# Patient Record
Sex: Female | Born: 1935 | Race: White | Hispanic: No | Smoking: Never smoker
Health system: Southern US, Community
[De-identification: ages and names within clinical notes are randomized; demographics above are authoritative.]

## PROBLEM LIST (undated history)

## (undated) DIAGNOSIS — E079 Disorder of thyroid, unspecified: Secondary | ICD-10-CM

## (undated) DIAGNOSIS — M810 Age-related osteoporosis without current pathological fracture: Secondary | ICD-10-CM

## (undated) DIAGNOSIS — Z86718 Personal history of other venous thrombosis and embolism: Secondary | ICD-10-CM

## (undated) DIAGNOSIS — F0390 Unspecified dementia without behavioral disturbance: Secondary | ICD-10-CM

## (undated) DIAGNOSIS — H409 Unspecified glaucoma: Secondary | ICD-10-CM

## (undated) DIAGNOSIS — I10 Essential (primary) hypertension: Secondary | ICD-10-CM

## (undated) HISTORY — DX: Personal history of other venous thrombosis and embolism: Z86.718

## (undated) HISTORY — PX: NO PAST SURGERIES: SHX2092

## (undated) HISTORY — DX: Unspecified glaucoma: H40.9

## (undated) HISTORY — DX: Essential (primary) hypertension: I10

## (undated) HISTORY — DX: Unspecified dementia, unspecified severity, without behavioral disturbance, psychotic disturbance, mood disturbance, and anxiety: F03.90

## (undated) HISTORY — DX: Age-related osteoporosis without current pathological fracture: M81.0

## (undated) HISTORY — DX: Disorder of thyroid, unspecified: E07.9

## (undated) NOTE — *Deleted (*Deleted)
Vascular Surgery  Patient with recent

---

## 2004-04-09 ENCOUNTER — Ambulatory Visit: Payer: Self-pay | Admitting: Family Medicine

## 2005-11-05 ENCOUNTER — Ambulatory Visit: Payer: Self-pay | Admitting: Family Medicine

## 2006-12-23 ENCOUNTER — Ambulatory Visit: Payer: Self-pay | Admitting: Family Medicine

## 2008-02-14 ENCOUNTER — Ambulatory Visit: Payer: Self-pay | Admitting: Anesthesiology

## 2009-02-14 ENCOUNTER — Ambulatory Visit: Payer: Self-pay | Admitting: Anesthesiology

## 2010-07-16 ENCOUNTER — Ambulatory Visit: Payer: Self-pay | Admitting: Anesthesiology

## 2011-08-06 ENCOUNTER — Ambulatory Visit: Payer: Self-pay | Admitting: Family Medicine

## 2011-12-23 ENCOUNTER — Emergency Department: Payer: Self-pay | Admitting: Emergency Medicine

## 2011-12-23 LAB — URINALYSIS, COMPLETE
Blood: NEGATIVE
Ketone: NEGATIVE
Nitrite: NEGATIVE
Ph: 5 (ref 4.5–8.0)
RBC,UR: <1 /HPF (ref 0–5)
Specific Gravity: 1.013 (ref 1.003–1.030)
Squamous Epithelial: <1

## 2011-12-23 LAB — COMPREHENSIVE METABOLIC PANEL
Alkaline Phosphatase: 57 U/L (ref 50–136)
Anion Gap: 6 — ABNORMAL LOW (ref 7–16)
BUN: 10 mg/dL (ref 7–18)
Calcium, Total: 9.5 mg/dL (ref 8.5–10.1)
Co2: 30 mmol/L (ref 21–32)
EGFR (Non-African Amer.): >60
Glucose: 129 mg/dL — ABNORMAL HIGH (ref 65–99)
Osmolality: 286 (ref 275–301)
Potassium: 4.2 mmol/L (ref 3.5–5.1)
SGOT(AST): 21 U/L (ref 15–37)
SGPT (ALT): 17 U/L (ref 12–78)
Sodium: 143 mmol/L (ref 136–145)
Total Protein: 7.4 g/dL (ref 6.4–8.2)

## 2011-12-23 LAB — CBC
HGB: 14.1 g/dL (ref 12.0–16.0)
MCH: 31.1 pg (ref 26.0–34.0)
MCV: 93 fL (ref 80–100)
RBC: 4.53 10*6/uL (ref 3.80–5.20)

## 2011-12-23 LAB — TROPONIN I: Troponin-I: <0.02 ng/mL

## 2012-01-20 ENCOUNTER — Ambulatory Visit: Payer: Self-pay | Admitting: Neurology

## 2013-06-14 DIAGNOSIS — I351 Nonrheumatic aortic (valve) insufficiency: Secondary | ICD-10-CM | POA: Insufficient documentation

## 2014-05-29 DIAGNOSIS — H4011X2 Primary open-angle glaucoma, moderate stage: Secondary | ICD-10-CM | POA: Diagnosis not present

## 2014-07-04 DIAGNOSIS — M810 Age-related osteoporosis without current pathological fracture: Secondary | ICD-10-CM | POA: Diagnosis not present

## 2014-07-10 ENCOUNTER — Ambulatory Visit
Admit: 2014-07-10 | Disposition: A | Discharge status: Home / Self Care | Payer: Self-pay | Attending: Family Medicine | Admitting: Family Medicine

## 2014-07-10 DIAGNOSIS — M8580 Other specified disorders of bone density and structure, unspecified site: Secondary | ICD-10-CM | POA: Diagnosis not present

## 2014-07-10 DIAGNOSIS — M8589 Other specified disorders of bone density and structure, multiple sites: Secondary | ICD-10-CM | POA: Diagnosis not present

## 2014-08-30 ENCOUNTER — Telehealth: Payer: Self-pay | Admitting: Family Medicine

## 2014-08-30 DIAGNOSIS — M8580 Other specified disorders of bone density and structure, unspecified site: Secondary | ICD-10-CM

## 2014-08-30 MED ORDER — ALENDRONATE SODIUM 70 MG PO TABS: 70.0000 mg | ORAL_TABLET | ORAL | Status: DC

## 2014-08-30 NOTE — Telephone Encounter (Signed)
Was a patient of Dr Cecile Hearing but states she has seen you before. Patient is completely out of Alendronate 70mg  she took the last one on Monday. Requesting that you send it to [facility]. Please call [phone] and it is okay to leave a detailed message. Pt was informed that they may have to reschedule an appointment but they wanted me to send a message first.

## 2015-01-03 ENCOUNTER — Ambulatory Visit: Payer: Self-pay | Admitting: Family Medicine

## 2015-05-07 ENCOUNTER — Ambulatory Visit (INDEPENDENT_AMBULATORY_CARE_PROVIDER_SITE_OTHER): Payer: Medicare Other | Admitting: Family Medicine

## 2015-05-07 ENCOUNTER — Encounter: Payer: Self-pay | Admitting: Family Medicine

## 2015-05-07 VITALS — BP 110/70 | HR 71 | Temp 97.4°F | Resp 16 | Ht 66.0 in | Wt 168.0 lb

## 2015-05-07 DIAGNOSIS — E785 Hyperlipidemia, unspecified: Secondary | ICD-10-CM | POA: Insufficient documentation

## 2015-05-07 DIAGNOSIS — E039 Hypothyroidism, unspecified: Secondary | ICD-10-CM | POA: Diagnosis not present

## 2015-05-07 DIAGNOSIS — M810 Age-related osteoporosis without current pathological fracture: Secondary | ICD-10-CM | POA: Insufficient documentation

## 2015-05-07 DIAGNOSIS — R739 Hyperglycemia, unspecified: Secondary | ICD-10-CM | POA: Diagnosis not present

## 2015-05-07 DIAGNOSIS — M8580 Other specified disorders of bone density and structure, unspecified site: Secondary | ICD-10-CM | POA: Diagnosis not present

## 2015-05-07 DIAGNOSIS — E079 Disorder of thyroid, unspecified: Secondary | ICD-10-CM | POA: Insufficient documentation

## 2015-05-07 MED ORDER — LEVOTHYROXINE SODIUM 50 MCG PO TABS: 50.0000 ug | ORAL_TABLET | Freq: Every day | ORAL | Status: DC

## 2015-05-07 MED ORDER — ALENDRONATE SODIUM 70 MG PO TABS: 70.0000 mg | ORAL_TABLET | ORAL | Status: DC

## 2015-05-07 NOTE — Progress Notes (Signed)
Name: [name]   MRN: [id number]    DOB: 1928-01-25   Date:05/07/2015       Progress Note  Subjective  Chief Complaint  Chief Complaint  Patient presents with  . Hypothyroidism  . Osteoporosis    Thyroid Problem Presents for follow-up visit. Patient reports no cold intolerance, constipation, depressed mood, dry skin, fatigue or hair loss. Past treatments include levothyroxine.   Osteoporosis  Pt. Has a long-history of Osteoporosis per Dr. Gaylene Brooks records, has been on Alendronate 70 mg once weekly, no side effects reported. Last DEXA Scan in April 2016, showed Osteopenia. Past Medical History  Diagnosis Date  . Thyroid disease   . Osteoporosis     Past Surgical History  Procedure Laterality Date  . No past surgeries      History reviewed. No pertinent family history.  Social History   Social History  . Marital Status: Widowed    Spouse Name: N/A  . Number of Children: N/A  . Years of Education: N/A   Occupational History  . Not on file.   Social History Main Topics  . Smoking status: Never Smoker   . Smokeless tobacco: Not on file  . Alcohol Use: No  . Drug Use: No  . Sexual Activity: Not on file   Other Topics Concern  . Not on file   Social History Narrative  . No narrative on file     Current outpatient prescriptions:  .  metoprolol succinate (TOPROL-XL) 25 MG 24 hr tablet, , Disp: , Rfl:  .  alendronate (FOSAMAX) 70 MG tablet, Take 1 tablet (70 mg total) by mouth every 7 (seven) days. Take with a full glass of water on an empty stomach., Disp: 12 tablet, Rfl: 3 .  aspirin EC 81 MG tablet, Take by mouth., Disp: , Rfl:  .  bimatoprost (LUMIGAN) 0.01 % SOLN, , Disp: , Rfl:  .  brinzolamide (AZOPT) 1 % ophthalmic suspension, , Disp: , Rfl:  .  levothyroxine (SYNTHROID, LEVOTHROID) 50 MCG tablet, Take by mouth., Disp: , Rfl:   No Known Allergies   Review of Systems  Constitutional: Negative for fever, chills, malaise/fatigue and fatigue.   Gastrointestinal: Negative for heartburn, nausea, vomiting and constipation.  Musculoskeletal: Negative for back pain and joint pain.  Endo/Heme/Allergies: Negative for cold intolerance.     Objective  Filed Vitals:   05/07/15 1110  BP: 110/70  Pulse: 71  Temp: 97.4 F (36.3 C)  TempSrc: Oral  Resp: 16  Height:  (1.676 m)  Weight: 168 lb (76.204 kg)  SpO2: 96%    Physical Exam  Constitutional: She is oriented to person, place, and time and well-developed, well-nourished, and in no distress.  HENT:  Head: Normocephalic and atraumatic.  Cardiovascular: Normal rate and regular rhythm.   Pulmonary/Chest: Effort normal and breath sounds normal.  Abdominal: Soft. Bowel sounds are normal.  Neurological: She is alert and oriented to person, place, and time.  Skin: Skin is warm and dry.  Nursing note and vitals reviewed.     Assessment & Plan  1. Hypothyroidism, unspecified hypothyroidism type Obtain TSH, continue on present dose of levothyroxine - TSH - levothyroxine (SYNTHROID, LEVOTHROID) 50 MCG tablet; Take 1 tablet (50 mcg total) by mouth daily before breakfast.  Dispense: 90 tablet; Refill: 3  2. Hyperlipidemia In the past, not on pharmacotherapy. Recheck lipids today - Lipid Profile  3. Hyperglycemia  - POCT HgB A1C  4. Osteopenia with high risk of fracture DEXA scan from April  2016 reviewed. Continue on bisphosphonate therapy. Repeat DEXA in April 2018 - alendronate (FOSAMAX) 70 MG tablet; Take 1 tablet (70 mg total) by mouth once a week. Take with a full glass of water on an empty stomach.  Dispense: 12 tablet; Refill: 3   Noa Galvao Asad A[facility] 05/07/2015 12:13 PM

## 2015-05-08 ENCOUNTER — Other Ambulatory Visit: Payer: Self-pay | Admitting: Family Medicine

## 2015-05-08 LAB — LIPID PANEL
CHOL/HDL RATIO: 3.6 ratio (ref 0.0–4.4)
CHOLESTEROL TOTAL: 279 mg/dL — AB (ref 100–199)
HDL: 77 mg/dL (ref >39)
LDL CALC: 164 mg/dL — AB (ref 0–99)
Triglycerides: 188 mg/dL — ABNORMAL HIGH (ref 0–149)
VLDL CHOLESTEROL CAL: 38 mg/dL (ref 5–40)

## 2015-05-08 LAB — TSH: TSH: 7.83 u[IU]/mL — ABNORMAL HIGH (ref 0.450–4.500)

## 2015-05-09 ENCOUNTER — Telehealth: Payer: Self-pay | Admitting: Family Medicine

## 2015-05-09 NOTE — Telephone Encounter (Signed)
Lab results have been reported to patient  

## 2015-05-09 NOTE — Telephone Encounter (Signed)
Requesting return call checking on test results.

## 2015-10-25 ENCOUNTER — Encounter: Payer: Self-pay | Admitting: Family Medicine

## 2015-10-25 ENCOUNTER — Ambulatory Visit (INDEPENDENT_AMBULATORY_CARE_PROVIDER_SITE_OTHER): Payer: Medicare Other | Admitting: Family Medicine

## 2015-10-25 VITALS — BP 108/70 | HR 75 | Temp 98.3°F | Resp 16 | Ht 66.0 in | Wt 165.8 lb

## 2015-10-25 DIAGNOSIS — R413 Other amnesia: Secondary | ICD-10-CM | POA: Diagnosis not present

## 2015-10-25 DIAGNOSIS — E079 Disorder of thyroid, unspecified: Secondary | ICD-10-CM

## 2015-10-25 LAB — CBC WITH DIFFERENTIAL/PLATELET
BASOS ABS: 96 {cells}/uL (ref 0–200)
Basophils Relative: 1 %
EOS ABS: 480 {cells}/uL (ref 15–500)
Eosinophils Relative: 5 %
HEMATOCRIT: 42.6 % (ref 35.0–45.0)
HEMOGLOBIN: 13.9 g/dL (ref 11.7–15.5)
LYMPHS ABS: 2592 {cells}/uL (ref 850–3900)
Lymphocytes Relative: 27 %
MCH: 30.2 pg (ref 27.0–33.0)
MCHC: 32.6 g/dL (ref 32.0–36.0)
MCV: 92.6 fL (ref 80.0–100.0)
MPV: 9.9 fL (ref 7.5–12.5)
Monocytes Absolute: 1056 cells/uL — ABNORMAL HIGH (ref 200–950)
Monocytes Relative: 11 %
NEUTROS ABS: 5376 {cells}/uL (ref 1500–7800)
NEUTROS PCT: 56 %
Platelets: 285 10*3/uL (ref 140–400)
RBC: 4.6 MIL/uL (ref 3.80–5.10)
RDW: 13.3 % (ref 11.0–15.0)
WBC: 9.6 10*3/uL (ref 3.8–10.8)

## 2015-10-25 LAB — VITAMIN B12: VITAMIN B 12: 408 pg/mL (ref 200–1100)

## 2015-10-25 NOTE — Progress Notes (Signed)
Name: [name]   MRN: [id number]    DOB: 12-21-1927   Date:10/25/2015       Progress Note  Subjective  Chief Complaint  Chief Complaint  Patient presents with  . Memory Loss    Short term    Thyroid Problem  Presents for follow-up visit. Symptoms include constipation, dry skin, fatigue and hair loss. The symptoms have been worsening.    Memory Impairment: Presents for memory impairment, gradual onset, recently getting worse.This is a new problem. Symptoms include having to repeat things over to remind her, difficulty remembering recent dates or events, getting easily confused and flustered.   These changes have been noticed by her son, who resides with her and is a primary caregiver.  Past Medical History:  Diagnosis Date  . Osteoporosis   . Thyroid disease     Past Surgical History:  Procedure Laterality Date  . NO PAST SURGERIES      History reviewed. No pertinent family history.  Social History   Social History  . Marital status: Widowed    Spouse name: N/A  . Number of children: N/A  . Years of education: N/A   Occupational History  . Not on file.   Social History Main Topics  . Smoking status: Never Smoker  . Smokeless tobacco: Never Used  . Alcohol use No  . Drug use: No  . Sexual activity: Not on file   Other Topics Concern  . Not on file   Social History Narrative  . No narrative on file     Current Outpatient Prescriptions:  .  alendronate (FOSAMAX) 70 MG tablet, Take 1 tablet (70 mg total) by mouth once a week. Take with a full glass of water on an empty stomach., Disp: 12 tablet, Rfl: 3 .  aspirin EC 81 MG tablet, Take by mouth., Disp: , Rfl:  .  bimatoprost (LUMIGAN) 0.01 % SOLN, , Disp: , Rfl:  .  brinzolamide (AZOPT) 1 % ophthalmic suspension, , Disp: , Rfl:  .  levothyroxine (SYNTHROID, LEVOTHROID) 50 MCG tablet, Take 1 tablet (50 mcg total) by mouth daily before breakfast., Disp: 90 tablet, Rfl: 3 .  metoprolol succinate  (TOPROL-XL) 25 MG 24 hr tablet, , Disp: , Rfl:   No Known Allergies   Review of Systems  Constitutional: Positive for fatigue and malaise/fatigue.  Gastrointestinal: Positive for constipation.  Psychiatric/Behavioral: Negative for depression. The patient does not have insomnia.       Objective  Vitals:   10/25/15 1124  BP: 108/70  Pulse: 75  Resp: 16  Temp: 98.3 F (36.8 C)  TempSrc: Oral  SpO2: 94%  Weight: 165 lb 12.8 oz (75.2 kg)  Height:  (1.676 m)    Physical Exam  Constitutional: She is oriented to person, place, and time and well-developed, well-nourished, and in no distress.  HENT:  Head: Normocephalic and atraumatic.  Cardiovascular: Normal rate, regular rhythm, S1 normal, S2 normal and normal heart sounds.   No murmur heard. Pulmonary/Chest: Effort normal. No respiratory distress. She has wheezes.  Abdominal: Soft. Bowel sounds are normal.  Neurological: She is alert and oriented to person, place, and time.  Psychiatric: Mood, affect and judgment normal.  Nursing note and vitals reviewed.    Assessment & Plan  1. Thyroid disease Repeat TSH, will likely need to increase Synthroid based on previous result - TSH  2. Gradual-onset memory impairment Rule out secondary etiologies, concern for dementia given the setting and patient's history. Will return in 2  weeks to obtain a formal written assessment for memory impairment and start on medication if indicated. - TSH - CBC with Differential - Vitamin B12   Kayce Betty Asad A. [facility] 10/25/2015 11:40 AM

## 2015-10-26 LAB — TSH: TSH: 3.64 m[IU]/L

## 2015-11-04 ENCOUNTER — Encounter: Payer: Self-pay | Admitting: Family Medicine

## 2015-11-04 ENCOUNTER — Ambulatory Visit (INDEPENDENT_AMBULATORY_CARE_PROVIDER_SITE_OTHER): Payer: Medicare Other | Admitting: Family Medicine

## 2015-11-04 DIAGNOSIS — G309 Alzheimer's disease, unspecified: Secondary | ICD-10-CM | POA: Diagnosis not present

## 2015-11-04 DIAGNOSIS — F0280 Dementia in other diseases classified elsewhere without behavioral disturbance: Secondary | ICD-10-CM | POA: Insufficient documentation

## 2015-11-04 MED ORDER — MEMANTINE HCL 5 MG PO TABS: 5.0000 mg | ORAL_TABLET | Freq: Two times a day (BID) | ORAL | 0 refills | Status: DC

## 2015-11-04 NOTE — Progress Notes (Signed)
Name: [name]   MRN: [id number]    DOB: [birth year]   Date:11/04/2015       Progress Note  Subjective  Chief Complaint  Chief Complaint  Patient presents with  . Follow-up    HPI  Pt. Presents for evaluation of gradual onset memory impairment, noticed by different family members, decline noticed in multiple domains including not remembering recent details like where she ate, asking repeatedly for details during conversations when she has already been told, [place] [facility] [place] has been going to for many years). No behavioral disturbance of sun-downing is observed per family members.   Past Medical History:  Diagnosis Date  . Osteoporosis   . Thyroid disease     Past Surgical History:  Procedure Laterality Date  . NO PAST SURGERIES      No family history on file.  Social History   Social History  . Marital status: Widowed    Spouse name: N/A  . Number of children: N/A  . Years of education: N/A   Occupational History  . Not on file.   Social History Main Topics  . Smoking status: Never Smoker  . Smokeless tobacco: Never Used  . Alcohol use No  . Drug use: No  . Sexual activity: Not on file   Other Topics Concern  . Not on file   Social History Narrative  . No narrative on file     Current Outpatient Prescriptions:  .  alendronate (FOSAMAX) 70 MG tablet, Take 1 tablet (70 mg total) by mouth once a week. Take with a full glass of water on an empty stomach., Disp: 12 tablet, Rfl: 3 .  aspirin EC 81 MG tablet, Take by mouth., Disp: , Rfl:  .  bimatoprost (LUMIGAN) 0.01 % SOLN, , Disp: , Rfl:  .  brinzolamide (AZOPT) 1 % ophthalmic suspension, , Disp: , Rfl:  .  levothyroxine (SYNTHROID, LEVOTHROID) 50 MCG tablet, Take 1 tablet (50 mcg total) by mouth daily before breakfast., Disp: 90 tablet, Rfl: 3 .  metoprolol succinate (TOPROL-XL) 25 MG 24 hr tablet, , Disp: , Rfl:   No Known Allergies   Review of Systems   Constitutional: Negative for chills and fever.  Psychiatric/Behavioral: Positive for memory loss. Negative for depression. The patient does not have insomnia.     Objective  Vitals:   11/04/15 1526  BP: (!) 142/68  Pulse: 71  Temp: 97.9 F (36.6 C)  TempSrc: Oral  Weight: 165 lb 8 oz (75.1 kg)  Height: 5\' 6"  (1.676 m)    Physical Exam  Constitutional: She is well-developed, well-nourished, and in no distress.  HENT:  Head: Normocephalic and atraumatic.  Cardiovascular: Normal rate and regular rhythm.   No murmur heard. Pulmonary/Chest: Effort normal and breath sounds normal. She has no wheezes.  Neurological: She is alert.  Psychiatric: Mood, affect and judgment normal.  Nursing note and vitals reviewed.    Assessment & Plan  1. Dementia of the Alzheimer's type without behavioral disturbance Lab work unremarkable, MMSE score is 19 out of 30, showing significant degree of impairment. We will start on Namenda 5 mg twice a day, follow-up in one month. - memantine (NAMENDA) 5 MG tablet; Take 1 tablet (5 mg total) by mouth 2 (two) times daily.  Dispense: 60 tablet; Refill: 0   Fabian Walder Asad A. [facility] 11/04/2015 4:05 PM

## 2015-11-07 ENCOUNTER — Encounter: Payer: Self-pay | Admitting: Family Medicine

## 2015-12-09 ENCOUNTER — Encounter: Payer: Self-pay | Admitting: Family Medicine

## 2015-12-09 ENCOUNTER — Ambulatory Visit (INDEPENDENT_AMBULATORY_CARE_PROVIDER_SITE_OTHER): Payer: Medicare Other | Admitting: Family Medicine

## 2015-12-09 VITALS — BP 140/71 | HR 71 | Temp 97.6°F | Resp 16 | Ht 66.0 in | Wt 166.3 lb

## 2015-12-09 DIAGNOSIS — E785 Hyperlipidemia, unspecified: Secondary | ICD-10-CM

## 2015-12-09 DIAGNOSIS — M8580 Other specified disorders of bone density and structure, unspecified site: Secondary | ICD-10-CM | POA: Diagnosis not present

## 2015-12-09 DIAGNOSIS — G309 Alzheimer's disease, unspecified: Secondary | ICD-10-CM | POA: Diagnosis not present

## 2015-12-09 DIAGNOSIS — F0280 Dementia in other diseases classified elsewhere without behavioral disturbance: Secondary | ICD-10-CM

## 2015-12-09 LAB — LIPID PANEL
CHOL/HDL RATIO: 4 ratio (ref <=5.0)
Cholesterol: 253 mg/dL — ABNORMAL HIGH (ref 125–200)
HDL: 64 mg/dL (ref >=46)
LDL CALC: 148 mg/dL — AB (ref <130)
TRIGLYCERIDES: 203 mg/dL — AB (ref <150)
VLDL: 41 mg/dL — ABNORMAL HIGH (ref <30)

## 2015-12-09 LAB — COMPLETE METABOLIC PANEL WITH GFR
ALBUMIN: 3.7 g/dL (ref 3.6–5.1)
ALK PHOS: 41 U/L (ref 33–130)
ALT: 7 U/L (ref 6–29)
AST: 13 U/L (ref 10–35)
BILIRUBIN TOTAL: 0.6 mg/dL (ref 0.2–1.2)
BUN: 10 mg/dL (ref 7–25)
CO2: 28 mmol/L (ref 20–31)
Calcium: 9.5 mg/dL (ref 8.6–10.4)
Chloride: 106 mmol/L (ref 98–110)
Creat: 0.7 mg/dL (ref 0.60–0.88)
GFR, EST NON AFRICAN AMERICAN: 79 mL/min (ref >=60)
GLUCOSE: 115 mg/dL — AB (ref 65–99)
POTASSIUM: 4.2 mmol/L (ref 3.5–5.3)
SODIUM: 140 mmol/L (ref 135–146)
TOTAL PROTEIN: 6.5 g/dL (ref 6.1–8.1)

## 2015-12-09 MED ORDER — MEMANTINE HCL 5 MG PO TABS: 5.0000 mg | ORAL_TABLET | Freq: Two times a day (BID) | ORAL | 1 refills | Status: DC

## 2015-12-09 MED ORDER — ALENDRONATE SODIUM 70 MG PO TABS: 70.0000 mg | ORAL_TABLET | ORAL | 0 refills | Status: DC

## 2015-12-09 NOTE — Progress Notes (Signed)
Name: [name]   MRN: [id number]    DOB: [birth year]   Date:12/09/2015       Progress Note  Subjective  Chief Complaint  Chief Complaint  Patient presents with  . Follow-up    1 mo  . Medication Refill    HPI  Dementia: Pt. Presents for medication refill and follow up. She scored 19/30 in MMSE in August 2017 and was started on Namenda 5 mg twice daily. SHe presents for follow up. Pt. Has been taking the medication as prescribed, reports no known adverse effects from Namenda. She seems to be doing well per family.   Past Medical History:  Diagnosis Date  . Osteoporosis   . Thyroid disease     Past Surgical History:  Procedure Laterality Date  . NO PAST SURGERIES      History reviewed. No pertinent family history.  Social History   Social History  . Marital status: Widowed    Spouse name: N/A  . Number of children: N/A  . Years of education: N/A   Occupational History  . Not on file.   Social History Main Topics  . Smoking status: Never Smoker  . Smokeless tobacco: Never Used  . Alcohol use No  . Drug use: No  . Sexual activity: Not on file   Other Topics Concern  . Not on file   Social History Narrative  . No narrative on file     Current Outpatient Prescriptions:  .  alendronate (FOSAMAX) 70 MG tablet, Take 1 tablet (70 mg total) by mouth once a week. Take with a full glass of water on an empty stomach., Disp: 12 tablet, Rfl: 3 .  aspirin EC 81 MG tablet, Take by mouth., Disp: , Rfl:  .  bimatoprost (LUMIGAN) 0.01 % SOLN, , Disp: , Rfl:  .  brinzolamide (AZOPT) 1 % ophthalmic suspension, , Disp: , Rfl:  .  levothyroxine (SYNTHROID, LEVOTHROID) 50 MCG tablet, Take 1 tablet (50 mcg total) by mouth daily before breakfast., Disp: 90 tablet, Rfl: 3 .  memantine (NAMENDA) 5 MG tablet, Take 1 tablet (5 mg total) by mouth 2 (two) times daily., Disp: 60 tablet, Rfl: 0 .  metoprolol succinate (TOPROL-XL) 25 MG 24 hr tablet, , Disp: , Rfl:   No Known  Allergies   Review of Systems  Constitutional: Negative for chills, fever, malaise/fatigue and weight loss.  Cardiovascular: Negative for chest pain.  Psychiatric/Behavioral: Negative for depression. The patient is not nervous/anxious and does not have insomnia.       Objective  Vitals:   12/09/15 1529  BP: 140/71  Pulse: 71  Resp: 16  Temp: 97.6 F (36.4 C)  TempSrc: Oral  SpO2: 96%  Weight: 166 lb 4.8 oz (75.4 kg)  Height: 5\' 6"  (1.676 m)    Physical Exam  Constitutional: She is well-developed, well-nourished, and in no distress.  Cardiovascular: Normal rate, regular rhythm and normal heart sounds.   Nursing note and vitals reviewed.      Assessment & Plan  1. Dementia of the Alzheimer's type without behavioral disturbance Patient taking Namenda 5 mg twice a day, no reported adverse effects. We will continue on the same and recheck MMSE in 6 months - memantine (NAMENDA) 5 MG tablet; Take 1 tablet (5 mg total) by mouth 2 (two) times daily.  Dispense: 90 tablet; Refill: 1  2. Osteopenia with high risk of fracture Last DEXA scan from April 2016 showed osteopenia, increased risk of major osteoporotic and hip fracture. -  alendronate (FOSAMAX) 70 MG tablet; Take 1 tablet (70 mg total) by mouth once a week. Take with a full glass of water on an empty stomach.  Dispense: 30 tablet; Refill: 0  3. Hyperlipidemia  - Lipid Profile - COMPLETE METABOLIC PANEL WITH GFR   Suri Tafolla Asad A. [facility] 12/09/2015 3:59 PM

## 2016-03-17 ENCOUNTER — Other Ambulatory Visit: Payer: Self-pay | Admitting: Family Medicine

## 2016-03-17 DIAGNOSIS — F0280 Dementia in other diseases classified elsewhere without behavioral disturbance: Secondary | ICD-10-CM

## 2016-03-17 DIAGNOSIS — G309 Alzheimer's disease, unspecified: Principal | ICD-10-CM

## 2016-04-23 ENCOUNTER — Other Ambulatory Visit: Payer: Self-pay | Admitting: Family Medicine

## 2016-04-23 DIAGNOSIS — E039 Hypothyroidism, unspecified: Secondary | ICD-10-CM

## 2016-04-26 ENCOUNTER — Other Ambulatory Visit: Payer: Self-pay | Admitting: Family Medicine

## 2016-04-26 DIAGNOSIS — E039 Hypothyroidism, unspecified: Secondary | ICD-10-CM

## 2016-04-29 ENCOUNTER — Ambulatory Visit (INDEPENDENT_AMBULATORY_CARE_PROVIDER_SITE_OTHER): Payer: Medicare Other | Admitting: Family Medicine

## 2016-04-29 ENCOUNTER — Encounter: Payer: Self-pay | Admitting: Family Medicine

## 2016-04-29 VITALS — BP 138/74 | HR 75 | Temp 97.9°F | Resp 16 | Ht 66.0 in | Wt 167.4 lb

## 2016-04-29 DIAGNOSIS — E782 Mixed hyperlipidemia: Secondary | ICD-10-CM

## 2016-04-29 DIAGNOSIS — G301 Alzheimer's disease with late onset: Secondary | ICD-10-CM | POA: Diagnosis not present

## 2016-04-29 DIAGNOSIS — E079 Disorder of thyroid, unspecified: Secondary | ICD-10-CM

## 2016-04-29 DIAGNOSIS — F0280 Dementia in other diseases classified elsewhere without behavioral disturbance: Secondary | ICD-10-CM

## 2016-04-29 MED ORDER — LEVOTHYROXINE SODIUM 50 MCG PO TABS: 50.0000 ug | ORAL_TABLET | Freq: Every day | ORAL | 1 refills | Status: DC

## 2016-04-29 MED ORDER — ROSUVASTATIN CALCIUM 5 MG PO TABS: 5.0000 mg | ORAL_TABLET | Freq: Every day | ORAL | 0 refills | Status: DC

## 2016-04-29 NOTE — Progress Notes (Signed)
Name: [name]   MRN: [id number]    DOB: October 25, 1927   Date:04/29/2016       Progress Note  Subjective  Chief Complaint  Chief Complaint  Patient presents with  . Medication Refill    recheck Thyroid    Thyroid Problem  Presents for follow-up visit. Patient reports no anxiety, cold intolerance, depressed mood, dry skin, fatigue or hair loss. The symptoms have been stable. Her past medical history is significant for hyperlipidemia.  Hyperlipidemia  This is a chronic problem. The problem is uncontrolled. Recent lipid tests were reviewed and are high. Current antihyperlipidemic treatment includes diet change (overall balanced diet, eats out often along with son.). Risk factors for coronary artery disease include dyslipidemia.     Past Medical History:  Diagnosis Date  . Osteoporosis   . Thyroid disease     Past Surgical History:  Procedure Laterality Date  . NO PAST SURGERIES      No family history on file.  Social History   Social History  . Marital status: Widowed    Spouse name: N/A  . Number of children: N/A  . Years of education: N/A   Occupational History  . Not on file.   Social History Main Topics  . Smoking status: Never Smoker  . Smokeless tobacco: Never Used  . Alcohol use No  . Drug use: No  . Sexual activity: Not on file   Other Topics Concern  . Not on file   Social History Narrative  . No narrative on file     Current Outpatient Prescriptions:  .  alendronate (FOSAMAX) 70 MG tablet, Take 1 tablet (70 mg total) by mouth once a week. Take with a full glass of water on an empty stomach., Disp: 30 tablet, Rfl: 0 .  aspirin EC 81 MG tablet, Take by mouth., Disp: , Rfl:  .  bimatoprost (LUMIGAN) 0.01 % SOLN, , Disp: , Rfl:  .  brinzolamide (AZOPT) 1 % ophthalmic suspension, , Disp: , Rfl:  .  levothyroxine (SYNTHROID, LEVOTHROID) 50 MCG tablet, Take 1 tablet (50 mcg total) by mouth daily before breakfast., Disp: 90 tablet, Rfl: 3 .   memantine (NAMENDA) 5 MG tablet, TAKE ONE TABLET BY MOUTH TWICE DAILY, Disp: 180 tablet, Rfl: 1 .  metoprolol succinate (TOPROL-XL) 25 MG 24 hr tablet, , Disp: , Rfl:   No Known Allergies   Review of Systems  Constitutional: Negative for fatigue.  Endo/Heme/Allergies: Negative for cold intolerance.  Psychiatric/Behavioral: The patient is not nervous/anxious.      Objective  Vitals:   04/29/16 1439  BP: 138/74  Pulse: 75  Resp: 16  Temp: 97.9 F (36.6 C)  TempSrc: Oral  SpO2: 96%  Weight: 167 lb 6.4 oz (75.9 kg)  Height: 5\' 6"  (1.676 m)    Physical Exam  Constitutional: She is well-developed, well-nourished, and in no distress.  Cardiovascular: Normal rate, regular rhythm and normal heart sounds.   No murmur heard. Pulmonary/Chest: Effort normal and breath sounds normal. She has no wheezes.  Abdominal: Soft. Bowel sounds are normal. There is no tenderness.  Neurological: She is alert.  Psychiatric: Mood, affect and judgment normal.  Nursing note and vitals reviewed.       Assessment & Plan  1. Mixed hyperlipidemia Crescendo discussion of benefits, side effects, and alternatives will start on low-dose statin therapy for management of hyperlipidemia. Patient's family is aware that it may lead to worsening of dementia. Follow-up with repeat FLP in 3-4 months - rosuvastatin (  CRESTOR) 5 MG tablet; Take 1 tablet (5 mg total) by mouth at bedtime.  Dispense: 90 tablet; Refill: 0  2. Thyroid disease  - levothyroxine (SYNTHROID, LEVOTHROID) 50 MCG tablet; Take 1 tablet (50 mcg total) by mouth daily before breakfast.  Dispense: 90 tablet; Refill: 1   3. Late onset Alzheimer's disease without behavioral disturbance Obtain MMSE in 6 months to compare with MMSE from 2017.   [name] Asad A. [facility] 04/29/2016 2:48 PM

## 2016-06-29 ENCOUNTER — Ambulatory Visit (INDEPENDENT_AMBULATORY_CARE_PROVIDER_SITE_OTHER): Payer: Medicare Other | Admitting: Family Medicine

## 2016-06-29 ENCOUNTER — Encounter: Payer: Self-pay | Admitting: Family Medicine

## 2016-06-29 VITALS — BP 120/70 | HR 70 | Temp 98.5°F | Resp 16 | Ht 66.0 in | Wt 163.4 lb

## 2016-06-29 DIAGNOSIS — M810 Age-related osteoporosis without current pathological fracture: Secondary | ICD-10-CM | POA: Diagnosis not present

## 2016-06-29 DIAGNOSIS — L989 Disorder of the skin and subcutaneous tissue, unspecified: Secondary | ICD-10-CM

## 2016-06-29 DIAGNOSIS — Z0000 Encounter for general adult medical examination without abnormal findings: Secondary | ICD-10-CM

## 2016-06-29 LAB — CBC WITH DIFFERENTIAL/PLATELET
BASOS PCT: 1 %
Basophils Absolute: 87 cells/uL (ref 0–200)
Eosinophils Absolute: 435 cells/uL (ref 15–500)
Eosinophils Relative: 5 %
HEMATOCRIT: 43.9 % (ref 35.0–45.0)
Hemoglobin: 14.3 g/dL (ref 11.7–15.5)
LYMPHS PCT: 28 %
Lymphs Abs: 2436 cells/uL (ref 850–3900)
MCH: 29.9 pg (ref 27.0–33.0)
MCHC: 32.6 g/dL (ref 32.0–36.0)
MCV: 91.6 fL (ref 80.0–100.0)
MONO ABS: 957 {cells}/uL — AB (ref 200–950)
MONOS PCT: 11 %
MPV: 9.5 fL (ref 7.5–12.5)
Neutro Abs: 4785 cells/uL (ref 1500–7800)
Neutrophils Relative %: 55 %
PLATELETS: 251 10*3/uL (ref 140–400)
RBC: 4.79 MIL/uL (ref 3.80–5.10)
RDW: 13.2 % (ref 11.0–15.0)
WBC: 8.7 10*3/uL (ref 3.8–10.8)

## 2016-06-29 NOTE — Progress Notes (Signed)
Name: [name]   MRN: [id number]    DOB: May 17, 1927   Date:06/29/2016       Progress Note  Subjective  Chief Complaint  Chief Complaint  Patient presents with  . Follow-up    2 month recheck  . Rash    lesion on nose    HPI  Pt. Is here for Complete Physical Exam. She is due for DEXA scan (has osteoporosis, on Fosamax) Mammogram and Colonoscopy are not indicated.   Past Medical History:  Diagnosis Date  . Osteoporosis   . Thyroid disease     Past Surgical History:  Procedure Laterality Date  . NO PAST SURGERIES      No family history on file.  Social History   Social History  . Marital status: Widowed    Spouse name: N/A  . Number of children: N/A  . Years of education: N/A   Occupational History  . Not on file.   Social History Main Topics  . Smoking status: Never Smoker  . Smokeless tobacco: Never Used  . Alcohol use No  . Drug use: No  . Sexual activity: Not on file   Other Topics Concern  . Not on file   Social History Narrative  . No narrative on file     Current Outpatient Prescriptions:  .  alendronate (FOSAMAX) 70 MG tablet, Take 1 tablet (70 mg total) by mouth once a week. Take with a full glass of water on an empty stomach., Disp: 30 tablet, Rfl: 0 .  aspirin EC 81 MG tablet, Take by mouth., Disp: , Rfl:  .  bimatoprost (LUMIGAN) 0.01 % SOLN, , Disp: , Rfl:  .  brinzolamide (AZOPT) 1 % ophthalmic suspension, , Disp: , Rfl:  .  levothyroxine (SYNTHROID, LEVOTHROID) 50 MCG tablet, Take 1 tablet (50 mcg total) by mouth daily before breakfast., Disp: 90 tablet, Rfl: 1 .  memantine (NAMENDA) 5 MG tablet, TAKE ONE TABLET BY MOUTH TWICE DAILY, Disp: 180 tablet, Rfl: 1 .  metoprolol succinate (TOPROL-XL) 25 MG 24 hr tablet, , Disp: , Rfl:  .  rosuvastatin (CRESTOR) 5 MG tablet, Take 1 tablet (5 mg total) by mouth at bedtime., Disp: 90 tablet, Rfl: 0  No Known Allergies   Review of Systems  Constitutional: Negative for chills,  fever and malaise/fatigue.  HENT: Negative for congestion, ear pain and sinus pain.   Eyes: Negative for blurred vision and double vision.  Respiratory: Negative for cough, sputum production and shortness of breath.   Cardiovascular: Negative for chest pain, palpitations and leg swelling.  Gastrointestinal: Negative for abdominal pain, blood in stool, constipation, diarrhea, nausea and vomiting.  Genitourinary: Negative for dysuria and hematuria.  Musculoskeletal: Negative for back pain, joint pain and neck pain.  Skin: Negative for rash (lesion on the left side of nose, present for over 2 months).  Neurological: Negative for dizziness and headaches.  Psychiatric/Behavioral: Negative for depression. The patient is not nervous/anxious and does not have insomnia.      Objective  Vitals:   06/29/16 1527  BP: 120/70  Pulse: 70  Resp: 16  Temp: 98.5 F (36.9 C)  TempSrc: Oral  SpO2: 96%  Weight: 163 lb 6.4 oz (74.1 kg)  Height:  (1.676 m)    Physical Exam  Constitutional: She is well-developed, well-nourished, and in no distress.  HENT:  Head: Normocephalic and atraumatic.    Right Ear: External ear normal.  Left Ear: External ear normal.  Nose: Nose normal.  Mouth/Throat:  Oropharynx is clear and moist.  Raised grayish colored plaque on the left side of the nose, no erythema.  Cardiovascular: Normal rate, regular rhythm and normal heart sounds.   No murmur heard. Pulmonary/Chest: Effort normal and breath sounds normal. She has no wheezes.  Abdominal: Soft. Bowel sounds are normal. There is no tenderness. There is no rebound.  Neurological: She is alert.  Nursing note and vitals reviewed.      Assessment & Plan  1. Well woman exam without gynecological exam Obtain age-appropriate laboratory screenings - CBC with Differential/Platelet - VITAMIN D 25 Hydroxy (Vit-D Deficiency, Fractures)  2. Lesion of skin of face Referral to dermatology for management of  raised lesion on the nose - Ambulatory referral to Dermatology  3. Age-related osteoporosis without current pathological fracture Obtain bone density for evaluation of osteoporosis, continue Fosamax - DG Bone Density; Future   Bellah Alia Asad [facility] 06/29/2016 4:00 PM

## 2016-06-30 LAB — VITAMIN D 25 HYDROXY (VIT D DEFICIENCY, FRACTURES): Vit D, 25-Hydroxy: 43 ng/mL (ref 30–100)

## 2016-07-27 ENCOUNTER — Ambulatory Visit (INDEPENDENT_AMBULATORY_CARE_PROVIDER_SITE_OTHER): Payer: Medicare Other

## 2016-07-27 VITALS — BP 122/58 | HR 64 | Temp 98.6°F | Ht 66.0 in | Wt 161.9 lb

## 2016-07-27 DIAGNOSIS — Z0000 Encounter for general adult medical examination without abnormal findings: Secondary | ICD-10-CM | POA: Diagnosis not present

## 2016-07-27 NOTE — Patient Instructions (Signed)
[name] , Thank you for taking time to come for your Medicare Wellness Visit. I appreciate your ongoing commitment to your health goals. Please review the following plan we discussed and let me know if I can assist you in the future.   Screening recommendations/referrals: Colonoscopy: completed 03/2005 Mammogram: completed 08/07/11 Bone Density: completed 07/10/14 Recommended yearly ophthalmology/optometry visit for glaucoma screening and checkup Recommended yearly dental visit for hygiene and checkup  Vaccinations: Influenza vaccine: up to date, due 11/2016 Pneumococcal vaccine: declined Tdap vaccine: declined Shingles vaccine: declined  Advanced directives: Please bring a copy of your POA (Power of Attorney) and/or Living Will to your next appointment.   Conditions/risks identified: Recommend increasing water intake to 4-6 glasses a day.  Next appointment: None, need to scheduled 1 year AWV.   Preventive Care 265 Years and Older, Female Preventive care refers to lifestyle choices and visits with your health care provider that can promote health and wellness. What does preventive care include?  A yearly physical exam. This is also called an annual well check.  Dental exams once or twice a year.  Routine eye exams. Ask your health care provider how often you should have your eyes checked.  Personal lifestyle choices, including:  Daily care of your teeth and gums.  Regular physical activity.  Eating a healthy diet.  Avoiding tobacco and drug use.  Limiting alcohol use.  Practicing safe sex.  Taking low-dose aspirin every day.  Taking vitamin and mineral supplements as recommended by your health care provider. What happens during an annual well check? The services and screenings done by your health care provider during your annual well check will depend on your age, overall health, lifestyle risk factors, and family history of disease. Counseling  Your health  care provider may ask you questions about your:  Alcohol use.  Tobacco use.  Drug use.  Emotional well-being.  Home and relationship well-being.  Sexual activity.  Eating habits.  History of falls.  Memory and ability to understand (cognition).  Work and work Astronomerenvironment.  Reproductive health. Screening  You may have the following tests or measurements:  Height, weight, and BMI.  Blood pressure.  Lipid and cholesterol levels. These may be checked every 5 years, or more frequently if you are over 81 years old.  Skin check.  Lung cancer screening. You may have this screening every year starting at age 81 if you have a 30-pack-year history of smoking and currently smoke or have quit within the past 15 years.  Fecal occult blood test (FOBT) of the stool. You may have this test every year starting at age 81.  Flexible sigmoidoscopy or colonoscopy. You may have a sigmoidoscopy every 5 years or a colonoscopy every 10 years starting at age 81.  Hepatitis C blood test.  Hepatitis B blood test.  Sexually transmitted disease (STD) testing.  Diabetes screening. This is done by checking your blood sugar (glucose) after you have not eaten for a while (fasting). You may have this done every 1-3 years.  Bone density scan. This is done to screen for osteoporosis. You may have this done starting at age 81.  Mammogram. This may be done every 1-2 years. Talk to your health care provider about how often you should have regular mammograms. Talk with your health care provider about your test results, treatment options, and if necessary, the need for more tests. Vaccines  Your health care provider may recommend certain vaccines, such as:  Influenza vaccine. This is recommended every  year.  Tetanus, diphtheria, and acellular pertussis (Tdap, Td) vaccine. You may need a Td booster every 10 years.  Zoster vaccine. You may need this after age 71.  Pneumococcal 13-valent conjugate  (PCV13) vaccine. One dose is recommended after age 86.  Pneumococcal polysaccharide (PPSV23) vaccine. One dose is recommended after age 40. Talk to your health care provider about which screenings and vaccines you need and how often you need them. This information is not intended to replace advice given to you by your health care provider. Make sure you discuss any questions you have with your health care provider. Document Released: 03/29/2015 Document Revised: 11/20/2015 Document Reviewed: 01/01/2015 Elsevier Interactive Patient Education  2017 [place] Prevention in the Home Falls can cause injuries. They can happen to people of all ages. There are many things you can do to make your home safe and to help prevent falls. What can I do on the outside of my home?  Regularly fix the edges of walkways and driveways and fix any cracks.  Remove anything that might make you trip as you walk through a door, such as a raised step or threshold.  Trim any bushes or trees on the path to your home.  Use bright outdoor lighting.  Clear any walking paths of anything that might make someone trip, such as rocks or tools.  Regularly check to see if handrails are loose or broken. Make sure that both sides of any steps have handrails.  Any raised decks and porches should have guardrails on the edges.  Have any leaves, snow, or ice cleared regularly.  Use sand or salt on walking paths during winter.  Clean up any spills in your garage right away. This includes oil or grease spills. What can I do in the bathroom?  Use night lights.  Install grab bars by the toilet and in the tub and shower. Do not use towel bars as grab bars.  Use non-skid mats or decals in the tub or shower.  If you need to sit down in the shower, use a plastic, non-slip stool.  Keep the floor dry. Clean up any water that spills on the floor as soon as it happens.  Remove soap buildup in the tub or shower  regularly.  Attach bath mats securely with double-sided non-slip rug tape.  Do not have throw rugs and other things on the floor that can make you trip. What can I do in the bedroom?  Use night lights.  Make sure that you have a light by your bed that is easy to reach.  Do not use any sheets or blankets that are too big for your bed. They should not hang down onto the floor.  Have a firm chair that has side arms. You can use this for support while you get dressed.  Do not have throw rugs and other things on the floor that can make you trip. What can I do in the kitchen?  Clean up any spills right away.  Avoid walking on wet floors.  Keep items that you use a lot in easy-to-reach places.  If you need to reach something above you, use a strong step stool that has a grab bar.  Keep electrical cords out of the way.  Do not use floor polish or wax that makes floors slippery. If you must use wax, use non-skid floor wax.  Do not have throw rugs and other things on the floor that can make you trip. What can  I do with my stairs?  Do not leave any items on the stairs.  Make sure that there are handrails on both sides of the stairs and use them. Fix handrails that are broken or loose. Make sure that handrails are as long as the stairways.  Check any carpeting to make sure that it is firmly attached to the stairs. Fix any carpet that is loose or worn.  Avoid having throw rugs at the top or bottom of the stairs. If you do have throw rugs, attach them to the floor with carpet tape.  Make sure that you have a light switch at the top of the stairs and the bottom of the stairs. If you do not have them, ask someone to add them for you. What else can I do to help prevent falls?  Wear shoes that:  Do not have high heels.  Have rubber bottoms.  Are comfortable and fit you well.  Are closed at the toe. Do not wear sandals.  If you use a stepladder:  Make sure that it is fully  opened. Do not climb a closed stepladder.  Make sure that both sides of the stepladder are locked into place.  Ask someone to hold it for you, if possible.  Clearly mark and make sure that you can see:  Any grab bars or handrails.  First and last steps.  Where the edge of each step is.  Use tools that help you move around (mobility aids) if they are needed. These include:  Canes.  Walkers.  Scooters.  Crutches.  Turn on the lights when you go into a dark area. Replace any light bulbs as soon as they burn out.  Set up your furniture so you have a clear path. Avoid moving your furniture around.  If any of your floors are uneven, fix them.  If there are any pets around you, be aware of where they are.  Review your medicines with your doctor. Some medicines can make you feel dizzy. This can increase your chance of falling. Ask your doctor what other things that you can do to help prevent falls. This information is not intended to replace advice given to you by your health care provider. Make sure you discuss any questions you have with your health care provider. Document Released: 12/27/2008 Document Revised: 08/08/2015 Document Reviewed: 04/06/2014 Elsevier Interactive Patient Education  2017 [employer].

## 2016-07-27 NOTE — Progress Notes (Signed)
Subjective:   [name] is a 83 y.o. female who presents for Medicare Annual (Subsequent) preventive examination.  Review of Systems:  N/A  Cardiac Risk Factors include: advanced age (>54men, >59 women);dyslipidemia;hypertension     Objective:     Vitals: BP (!) 122/58 (BP Location: Left Arm)   Pulse 64   Temp 98.6 F (37 C) (Oral)   Ht 5\' 6"  (1.676 m)   Wt 161 lb 14.4 oz (73.4 kg)   BMI 26.13 kg/m   Body mass index is 26.13 kg/m.   Tobacco History  Smoking Status  . Never Smoker  Smokeless Tobacco  . Never Used     Counseling given: Not Answered   Past Medical History:  Diagnosis Date  . Osteoporosis   . Thyroid disease    Past Surgical History:  Procedure Laterality Date  . NO PAST SURGERIES     History reviewed. No pertinent family history. History  Sexual Activity  . Sexual activity: Not on file    Outpatient Encounter Prescriptions as of 07/27/2016  Medication Sig  . alendronate (FOSAMAX) 70 MG tablet Take 1 tablet (70 mg total) by mouth once a week. Take with a full glass of water on an empty stomach.  Marland Kitchen aspirin EC 81 MG tablet Take by mouth.  . bimatoprost (LUMIGAN) 0.01 % SOLN   . brinzolamide (AZOPT) 1 % ophthalmic suspension   . levothyroxine (SYNTHROID, LEVOTHROID) 50 MCG tablet Take 1 tablet (50 mcg total) by mouth daily before breakfast.  . memantine (NAMENDA) 5 MG tablet TAKE ONE TABLET BY MOUTH TWICE DAILY  . metoprolol succinate (TOPROL-XL) 25 MG 24 hr tablet   . rosuvastatin (CRESTOR) 5 MG tablet Take 1 tablet (5 mg total) by mouth at bedtime.   No facility-administered encounter medications on file as of 07/27/2016.     Activities of Daily Living In your present state of health, do you have any difficulty performing the following activities: 07/27/2016 12/09/2015  Hearing? Malvin Johns  Vision? N Y  Difficulty concentrating or making decisions? Y N  Walking or climbing stairs? N N  Dressing or bathing? N N  Doing errands,  shopping? Malvin Johns  Preparing Food and eating ? N -  Using the Toilet? N -  In the past six months, have you accidently leaked urine? N -  Do you have problems with loss of bowel control? N -  Managing your Medications? N -  Managing your Finances? Y -  Housekeeping or managing your Housekeeping? N -  Some recent data might be hidden    Patient Care Team: Ellyn Hack, MD as PCP - General (Family Medicine) Irene Limbo., MD as Consulting Physician (Ophthalmology) Marcina Millard, MD as Consulting Physician (Cardiology)    Assessment:     Exercise Activities and Dietary recommendations Current Exercise Habits: The patient does not participate in regular exercise at present, Exercise limited by: None identified  Goals    . Increase water intake          Recommend increasing water intake to 4-6 glasses a day.      Fall Risk Fall Risk  07/27/2016 12/09/2015 10/25/2015 05/07/2015  Falls in the past year? No No No No   Depression Screen PHQ 2/9 Scores 07/27/2016 12/09/2015 10/25/2015 05/07/2015  PHQ - 2 Score 0 0 0 0     Cognitive Function     6CIT Screen 07/27/2016  What Year? 4 points  What month? 3 points  What time? 0  points  Count back from 20 0 points  Months in reverse 4 points  Repeat phrase 8 points  Total Score 19    Immunization History  Administered Date(s) Administered  . Influenza, High Dose Seasonal PF 12/28/2015   Screening Tests Health Maintenance  Topic Date Due  . PNA vac Low Risk Adult (1 of 2 - PCV13) 08/14/2016 (Originally 02/25/1995)  . TETANUS/TDAP  03/16/2026 (Originally 02/24/1947)  . INFLUENZA VACCINE  10/14/2016  . DEXA SCAN  Completed      Plan:  I have personally reviewed and addressed the Medicare Annual Wellness questionnaire and have noted the following in the patient's chart:  A. Medical and social history B. Use of alcohol, tobacco or illicit drugs  C. Current medications and supplements D. Functional ability and  status E.  Nutritional status F.  Physical activity G. Advance directives H. List of other physicians I.  Hospitalizations, surgeries, and ER visits in previous 12 months J.  Vitals K. Screenings such as hearing and vision if needed, cognitive and depression L. Referrals and appointments - none  In addition, I have reviewed and discussed with patient certain preventive protocols, quality metrics, and best practice recommendations. A written personalized care plan for preventive services as well as general preventive health recommendations were provided to patient.  See attached scanned questionnaire for additional information.   Signed,  Hyacinth MeekerMckenzie Ho Parisi, LPN Nurse Health Advisor   MD Recommendations: None.  I, as supervising physician, have reviewed the nurse health advisor's Medicare Wellness Visit note for this patient and concur with the findings and recommendations listed above.  Signed Syed Asad A. Sherryll BurgerShah MD Attending Physician.

## 2016-07-29 ENCOUNTER — Other Ambulatory Visit: Payer: Self-pay | Admitting: Family Medicine

## 2016-07-29 DIAGNOSIS — E782 Mixed hyperlipidemia: Secondary | ICD-10-CM

## 2016-07-30 ENCOUNTER — Other Ambulatory Visit: Payer: Self-pay | Admitting: Family Medicine

## 2016-07-30 DIAGNOSIS — E782 Mixed hyperlipidemia: Secondary | ICD-10-CM

## 2016-08-26 ENCOUNTER — Encounter: Payer: Self-pay | Admitting: Family Medicine

## 2016-08-26 ENCOUNTER — Ambulatory Visit (INDEPENDENT_AMBULATORY_CARE_PROVIDER_SITE_OTHER): Payer: Medicare Other | Admitting: Family Medicine

## 2016-08-26 DIAGNOSIS — G301 Alzheimer's disease with late onset: Secondary | ICD-10-CM

## 2016-08-26 DIAGNOSIS — E782 Mixed hyperlipidemia: Secondary | ICD-10-CM | POA: Diagnosis not present

## 2016-08-26 DIAGNOSIS — F0280 Dementia in other diseases classified elsewhere without behavioral disturbance: Secondary | ICD-10-CM

## 2016-08-26 DIAGNOSIS — E079 Disorder of thyroid, unspecified: Secondary | ICD-10-CM

## 2016-08-26 MED ORDER — MEMANTINE HCL 5 MG PO TABS: 5.0000 mg | ORAL_TABLET | Freq: Two times a day (BID) | ORAL | 1 refills | Status: DC

## 2016-08-26 MED ORDER — LEVOTHYROXINE SODIUM 50 MCG PO TABS: 50.0000 ug | ORAL_TABLET | Freq: Every day | ORAL | 1 refills | Status: DC

## 2016-08-26 MED ORDER — ROSUVASTATIN CALCIUM 5 MG PO TABS: 5.0000 mg | ORAL_TABLET | Freq: Every day | ORAL | 0 refills | Status: DC

## 2016-08-26 NOTE — Progress Notes (Signed)
Name: [name]   MRN: [id number]    DOB: [birth year]   Date:08/26/2016       Progress Note  Subjective  Chief Complaint  Chief Complaint  Patient presents with  . Medicare Wellness    Part 2 wellness physical    Hyperlipidemia  This is a chronic problem. The problem is uncontrolled. Recent lipid tests were reviewed and are high. Pertinent negatives include no leg pain, myalgias or shortness of breath. Current antihyperlipidemic treatment includes statins. Risk factors for coronary artery disease include dyslipidemia.  Thyroid Problem  Presents for follow-up visit. Patient reports no cold intolerance, depressed mood, dry skin, fatigue or hair loss. The symptoms have been stable. Her past medical history is significant for hyperlipidemia.   Alzheimer's Dementia: She seems to be doing fairly well from memory standpoint, sometimes she gets confused and asks the same question again and again, no behavioral disturbance. She takes Memantine 5 mg BID, no reported adverse effects.     Past Medical History:  Diagnosis Date  . Osteoporosis   . Thyroid disease     Past Surgical History:  Procedure Laterality Date  . NO PAST SURGERIES      No family history on file.  Social History   Social History  . Marital status: Widowed    Spouse name: N/A  . Number of children: N/A  . Years of education: N/A   Occupational History  . Not on file.   Social History Main Topics  . Smoking status: Never Smoker  . Smokeless tobacco: Never Used  . Alcohol use No  . Drug use: No  . Sexual activity: No   Other Topics Concern  . Not on file   Social History Narrative  . No narrative on file     Current Outpatient Prescriptions:  .  alendronate (FOSAMAX) 70 MG tablet, Take 1 tablet (70 mg total) by mouth once a week. Take with a full glass of water on an empty stomach., Disp: 30 tablet, Rfl: 0 .  aspirin EC 81 MG tablet, Take by mouth., Disp: , Rfl:  .  bimatoprost (LUMIGAN)  0.01 % SOLN, , Disp: , Rfl:  .  brinzolamide (AZOPT) 1 % ophthalmic suspension, , Disp: , Rfl:  .  levothyroxine (SYNTHROID, LEVOTHROID) 50 MCG tablet, Take 1 tablet (50 mcg total) by mouth daily before breakfast., Disp: 90 tablet, Rfl: 1 .  memantine (NAMENDA) 5 MG tablet, TAKE ONE TABLET BY MOUTH TWICE DAILY, Disp: 180 tablet, Rfl: 1 .  metoprolol succinate (TOPROL-XL) 25 MG 24 hr tablet, , Disp: , Rfl:  .  Multiple Vitamins-Minerals (MULTIVITAMIN ADULT) TABS, Take by mouth., Disp: , Rfl:  .  rosuvastatin (CRESTOR) 5 MG tablet, TAKE 1 TABLET BY MOUTH AT BEDTIME, Disp: 90 tablet, Rfl: 0 .  VITAMIN D, ERGOCALCIFEROL, PO, Take by mouth., Disp: , Rfl:   No Known Allergies   Review of Systems  Constitutional: Negative for fatigue.  Respiratory: Negative for shortness of breath.   Musculoskeletal: Negative for myalgias.  Endo/Heme/Allergies: Negative for cold intolerance.     Objective  Vitals:   08/26/16 1507  BP: (!) 128/56  Pulse: 64  Resp: 16  Temp: 98 F (36.7 C)  TempSrc: Oral  SpO2: 94%  Weight: 160 lb 3.2 oz (72.7 kg)  Height: 5\' 6"  (1.676 m)    Physical Exam  Constitutional: She is well-developed, well-nourished, and in no distress.  HENT:  Head: Normocephalic and atraumatic.  Cardiovascular: Normal rate, regular rhythm and normal heart  sounds.   No murmur heard. Pulmonary/Chest: Effort normal and breath sounds normal. She has no wheezes.  Abdominal: Soft. Bowel sounds are normal. There is no tenderness. There is no rebound.  Musculoskeletal: She exhibits no edema.  Neurological: She is alert.  Psychiatric: Mood, memory, affect and judgment normal.  Nursing note and vitals reviewed.    Assessment & Plan  1. Mixed hyperlipidemia Expect improvement in fasting lipid profile, she has been on low-dose statin - rosuvastatin (CRESTOR) 5 MG tablet; Take 1 tablet (5 mg total) by mouth at bedtime.  Dispense: 90 tablet; Refill: 0 - Lipid panel  2. 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  Dispense: 180 tablet; Refill: 1  3. Thyroid disease  - levothyroxine (SYNTHROID, LEVOTHROID) 50 MCG tablet; Take 1 tablet (50 mcg total) by mouth daily before breakfast.  Dispense: 90 tablet; Refill: 1 - TSH   Nettye Flegal Asad A. [facility] 08/26/2016 3:32 PM

## 2016-08-31 ENCOUNTER — Ambulatory Visit
Admission: RE | Admit: 2016-08-31 | Discharge: 2016-08-31 | Disposition: A | Discharge status: Home / Self Care | Payer: Medicare Other | Source: Ambulatory Visit | Attending: Family Medicine | Admitting: Family Medicine

## 2016-08-31 DIAGNOSIS — M8588 Other specified disorders of bone density and structure, other site: Secondary | ICD-10-CM | POA: Diagnosis not present

## 2016-08-31 DIAGNOSIS — M810 Age-related osteoporosis without current pathological fracture: Secondary | ICD-10-CM | POA: Diagnosis present

## 2016-09-01 LAB — LIPID PANEL
CHOL/HDL RATIO: 2.3 ratio (ref <5.0)
Cholesterol: 165 mg/dL (ref <200)
HDL: 73 mg/dL (ref >50)
LDL CALC: 66 mg/dL (ref <100)
Triglycerides: 129 mg/dL (ref <150)
VLDL: 26 mg/dL (ref <30)

## 2016-09-01 LAB — TSH: TSH: 4.33 mIU/L

## 2016-09-20 ENCOUNTER — Other Ambulatory Visit: Payer: Self-pay | Admitting: Family Medicine

## 2016-09-20 DIAGNOSIS — M8580 Other specified disorders of bone density and structure, unspecified site: Secondary | ICD-10-CM

## 2017-01-27 ENCOUNTER — Other Ambulatory Visit: Payer: Self-pay | Admitting: Family Medicine

## 2017-01-27 DIAGNOSIS — E782 Mixed hyperlipidemia: Secondary | ICD-10-CM

## 2017-01-28 ENCOUNTER — Telehealth: Payer: Self-pay | Admitting: Family Medicine

## 2017-01-28 NOTE — Telephone Encounter (Signed)
Copied from CRM [id number]. Topic: Quick Communication - See Telephone Encounter >> Jan 28, 2017  4:57 PM Trula SladeWalter, Linda F wrote: CRM for notification. See Telephone encounter for:  01/28/17. Patient want to know if her prescription for Rosuvastatin has been filled.

## 2017-01-29 ENCOUNTER — Other Ambulatory Visit: Payer: Self-pay | Admitting: Family Medicine

## 2017-01-29 DIAGNOSIS — E782 Mixed hyperlipidemia: Secondary | ICD-10-CM

## 2017-01-29 MED ORDER — ROSUVASTATIN CALCIUM 5 MG PO TABS: 5.0000 mg | ORAL_TABLET | Freq: Every day | ORAL | 0 refills | Status: DC

## 2017-01-29 NOTE — Telephone Encounter (Signed)
Left a detail message asking if the apt that was schedule for med refill on 02/10/2017 @ 4pm was ok and how many pills does she has left pf her Crestor medicines and will have last her until her apt with Dr. Sherryll BurgerShah.

## 2017-01-29 NOTE — Telephone Encounter (Signed)
Daughter Elpidio GaleaBetty Cable called back. Please call back at [phone] and leave detailed message If no answer.

## 2017-01-29 NOTE — Telephone Encounter (Signed)
Pt apt has been schedule but she is completely out of medication.  Asking for a emergency supply unitl her apt on 02/10/2017 at 4pm

## 2017-01-29 NOTE — Telephone Encounter (Signed)
Copied from CRM #[id number]. Topic: Quick Communication - Office Called Patient >> Jan 29, 2017  2:35 PM Lathan, Latoya M, NT wrote: Pt. Is completely out of medication will need asking if she can get a refill to hold her until her appt. 02/10/17 at 4pm with Dr. Shah  

## 2017-01-29 NOTE — Telephone Encounter (Signed)
Called and the pt pick up while I was leaving a voicemail. Tried to get some information from her regarding her medication and she really couldn't understand who is Dr. Sherryll BurgerShah and what she needed. I was able to schedule an apt but I not sure she understands the reason. I tried to see if she had any Crestor left and she did know. She finally stated to me her son take care of all that and that he gets off at 3pm. I don't see a cell number to call and speak the pt son. Called the next number and contact information; betty cable.

## 2017-01-29 NOTE — Telephone Encounter (Signed)
Last two lipids reviewed Rx approved

## 2017-01-29 NOTE — Telephone Encounter (Signed)
Copied from CRM [id number]. Topic: Quick Communication - Office Called Patient >> Jan 29, 2017  2:35 PM [name], [name], [place] wrote: Pt. Is completely out of medication will need asking if she can get a refill to hold her until her appt. 02/10/17 at 4pm with Dr. Sherryll BurgerShah

## 2017-02-10 ENCOUNTER — Ambulatory Visit (INDEPENDENT_AMBULATORY_CARE_PROVIDER_SITE_OTHER): Payer: Medicare Other | Admitting: Family Medicine

## 2017-02-10 ENCOUNTER — Encounter: Payer: Self-pay | Admitting: Family Medicine

## 2017-02-10 VITALS — BP 190/70 | HR 69 | Temp 97.6°F | Resp 16 | Ht 66.0 in | Wt 150.5 lb

## 2017-02-10 DIAGNOSIS — G301 Alzheimer's disease with late onset: Secondary | ICD-10-CM | POA: Diagnosis not present

## 2017-02-10 DIAGNOSIS — I10 Essential (primary) hypertension: Secondary | ICD-10-CM | POA: Diagnosis not present

## 2017-02-10 DIAGNOSIS — F0280 Dementia in other diseases classified elsewhere without behavioral disturbance: Secondary | ICD-10-CM | POA: Diagnosis not present

## 2017-02-10 DIAGNOSIS — E7800 Pure hypercholesterolemia, unspecified: Secondary | ICD-10-CM

## 2017-02-10 DIAGNOSIS — E079 Disorder of thyroid, unspecified: Secondary | ICD-10-CM

## 2017-02-10 HISTORY — DX: Essential (primary) hypertension: I10

## 2017-02-10 MED ORDER — MEMANTINE HCL 5 MG PO TABS: 5.0000 mg | ORAL_TABLET | Freq: Two times a day (BID) | ORAL | 1 refills | Status: DC

## 2017-02-10 MED ORDER — LEVOTHYROXINE SODIUM 50 MCG PO TABS: 50.0000 ug | ORAL_TABLET | Freq: Every day | ORAL | 1 refills | Status: DC

## 2017-02-10 MED ORDER — METOPROLOL SUCCINATE ER 25 MG PO TB24: ORAL_TABLET | ORAL | 0 refills | Status: DC

## 2017-02-10 NOTE — Progress Notes (Signed)
Name: Deborah SchleinJeraldine N Mack   MRN: [id number]    DOB: 01-Sep-1927   Date:02/10/2017       Progress Note  Subjective  Chief Complaint  Chief Complaint  Patient presents with  . Medication Refill  . Hyperlipidemia  . Thyroid Problem  . Dementia    Hyperlipidemia  This is a chronic problem. The problem is controlled. Recent lipid tests were reviewed and are normal. Pertinent negatives include no chest pain, myalgias or shortness of breath. Current antihyperlipidemic treatment includes statins.  Thyroid Problem  Presents for follow-up visit. Patient reports no anxiety, cold intolerance, depressed mood, dry skin, fatigue, hair loss or palpitations. The symptoms have been stable. Her past medical history is significant for hyperlipidemia.  Hypertension  This is a chronic problem. The problem is unchanged. The problem is uncontrolled. Pertinent negatives include no blurred vision, chest pain, headaches, palpitations or shortness of breath. Past treatments include beta blockers. There is no history of kidney disease, CAD/MI or CVA. Identifiable causes of hypertension include a thyroid problem.     Past Medical History:  Diagnosis Date  . Osteoporosis   . Thyroid disease     Past Surgical History:  Procedure Laterality Date  . NO PAST SURGERIES      No family history on file.  Social History   Socioeconomic History  . Marital status: Widowed    Spouse name: Not on file  . Number of children: Not on file  . Years of education: Not on file  . Highest education level: Not on file  Social Needs  . Financial resource strain: Not on file  . Food insecurity - worry: Not on file  . Food insecurity - inability: Not on file  . Transportation needs - medical: Not on file  . Transportation needs - non-medical: Not on file  Occupational History  . Not on file  Tobacco Use  . Smoking status: Never Smoker  . Smokeless tobacco: Never Used  Substance and Sexual Activity  . Alcohol use: No     Alcohol/week: 0.0 oz  . Drug use: No  . Sexual activity: No  Other Topics Concern  . Not on file  Social History Narrative  . Not on file     Current Outpatient Medications:  .  alendronate (FOSAMAX) 70 MG tablet, TAKE ONE TABLET BY MOUTH ONCE A WEEK TAKE  WITH  A  FULL  GLASS  OF  WATER  ON  AN  EMPTY  STOMACH, Disp: 4 tablet, Rfl: 0 .  aspirin EC 81 MG tablet, Take by mouth., Disp: , Rfl:  .  bimatoprost (LUMIGAN) 0.01 % SOLN, , Disp: , Rfl:  .  brinzolamide (AZOPT) 1 % ophthalmic suspension, , Disp: , Rfl:  .  Calcium Carbonate-Vitamin D (CALCIUM HIGH POTENCY/VITAMIN D) 600-200 MG-UNIT TABS, Take by mouth., Disp: , Rfl:  .  levothyroxine (SYNTHROID, LEVOTHROID) 50 MCG tablet, Take 1 tablet (50 mcg total) by mouth daily before breakfast., Disp: 90 tablet, Rfl: 1 .  memantine (NAMENDA) 5 MG tablet, Take 1 tablet (5 mg total) by mouth 2 (two) times daily., Disp: 180 tablet, Rfl: 1 .  metoprolol succinate (TOPROL-XL) 25 MG 24 hr tablet, , Disp: , Rfl:  .  Multiple Vitamins-Minerals (MULTIVITAMIN ADULT) TABS, Take by mouth., Disp: , Rfl:  .  rosuvastatin (CRESTOR) 5 MG tablet, Take 1 tablet (5 mg total) at bedtime by mouth., Disp: 90 tablet, Rfl: 0 .  VITAMIN D, ERGOCALCIFEROL, PO, Take 2,000 tablets by mouth daily. , Disp: ,  Rfl:   No Known Allergies   Review of Systems  Constitutional: Negative for fatigue.  Eyes: Negative for blurred vision.  Respiratory: Negative for shortness of breath.   Cardiovascular: Negative for chest pain and palpitations.  Musculoskeletal: Negative for myalgias.  Neurological: Negative for headaches.  Endo/Heme/Allergies: Negative for cold intolerance.  Psychiatric/Behavioral: The patient is not nervous/anxious.       Objective  Vitals:   02/10/17 1614 02/10/17 1622  BP: (!) 182/90 (!) 190/70  Pulse: 69   Resp: 16   Temp: 97.6 F (36.4 C)   TempSrc: Oral   SpO2: 98%   Weight: 150 lb 8 oz (68.3 kg)   Height: 5\' 6"  (1.676 m)      Physical Exam  Constitutional: She is oriented to person, place, and time and well-developed, well-nourished, and in no distress.  HENT:  Head: Normocephalic and atraumatic.  Cardiovascular: Normal rate, regular rhythm and normal heart sounds.  No murmur heard. Pulmonary/Chest: Effort normal and breath sounds normal. She has no wheezes.  Abdominal: Soft. Bowel sounds are normal. There is no tenderness.  Musculoskeletal: She exhibits no edema.  Neurological: She is alert and oriented to person, place, and time.  Skin: She is not diaphoretic.  Psychiatric: Mood, memory, affect and judgment normal.  Nursing note and vitals reviewed.    Assessment & Plan  1. Thyroid disease  - levothyroxine (SYNTHROID, LEVOTHROID) 50 MCG tablet; Take 1 tablet (50 mcg total) by mouth daily before breakfast.  Dispense: 90 tablet; Refill: 1 - TSH  2. Late onset Alzheimer's disease without behavioral disturbance  - memantine (NAMENDA) 5 MG tablet; Take 1 tablet (5 mg total) by mouth 2 (two) times daily.  Dispense: 180 tablet; Refill: 1  3. Pure hypercholesterolemia  - Lipid panel - COMPLETE METABOLIC PANEL WITH GFR  4. Essential hypertension Blood pressure is elevated on manual repeat, advised to increase the dosage of metoprolol ER 75 mg daily, 50 mg to be taken at night and 25 mg in the morning, so advised to avoid foods high in salt as patient has been dining out frequently which may be contributed factor. advised to check blood pressure and heart rate regularly, reassess in one month. - metoprolol succinate (TOPROL-XL) 25 MG 24 hr tablet; Take 1 tab po qAM and po qHS  Dispense: 270 tablet; Refill: 0   Nida Manfredi Asad A. Faylene KurtzShah [facility] 02/10/2017 4:33 PM

## 2017-02-12 LAB — COMPLETE METABOLIC PANEL WITH GFR
AG RATIO: 1.4 (calc) (ref 1.0–2.5)
ALKALINE PHOSPHATASE (APISO): 55 U/L (ref 33–130)
ALT: 6 U/L (ref 6–29)
AST: 11 U/L (ref 10–35)
Albumin: 4 g/dL (ref 3.6–5.1)
BILIRUBIN TOTAL: 0.7 mg/dL (ref 0.2–1.2)
BUN: 8 mg/dL (ref 7–25)
CO2: 29 mmol/L (ref 20–32)
Calcium: 10 mg/dL (ref 8.6–10.4)
Chloride: 103 mmol/L (ref 98–110)
Creat: 0.69 mg/dL (ref 0.60–0.88)
GFR, Est African American: 91 mL/min/{1.73_m2} (ref >=60)
GFR, Est Non African American: 79 mL/min/{1.73_m2} (ref >=60)
GLOBULIN: 2.9 g/dL (ref 1.9–3.7)
Glucose, Bld: 132 mg/dL — ABNORMAL HIGH (ref 65–99)
POTASSIUM: 4.3 mmol/L (ref 3.5–5.3)
SODIUM: 139 mmol/L (ref 135–146)
Total Protein: 6.9 g/dL (ref 6.1–8.1)

## 2017-02-12 LAB — LIPID PANEL
CHOL/HDL RATIO: 2 (calc) (ref <5.0)
CHOLESTEROL: 181 mg/dL (ref <200)
HDL: 92 mg/dL (ref >50)
LDL CHOLESTEROL (CALC): 71 mg/dL
Non-HDL Cholesterol (Calc): 89 mg/dL (calc) (ref <130)
TRIGLYCERIDES: 99 mg/dL (ref <150)

## 2017-02-12 LAB — TSH: TSH: 5.14 mIU/L — ABNORMAL HIGH (ref 0.40–4.50)

## 2017-02-17 ENCOUNTER — Ambulatory Visit: Payer: Medicare Other

## 2017-02-17 VITALS — BP 168/66 | HR 59

## 2017-02-17 DIAGNOSIS — I10 Essential (primary) hypertension: Secondary | ICD-10-CM

## 2017-02-17 NOTE — Progress Notes (Signed)
Patient denies any symptoms and BP at home has been running 150/90-60 and the highest 182/80. Dr. Sherryll BurgerShah states the patient should increase her dosage to Metoprolol 50 mg bid and keep her appointment in 1 month for Follow up.

## 2017-02-19 ENCOUNTER — Telehealth: Payer: Self-pay

## 2017-02-19 DIAGNOSIS — E079 Disorder of thyroid, unspecified: Secondary | ICD-10-CM

## 2017-02-19 NOTE — Telephone Encounter (Signed)
Copied from CRM [id number]. Topic: General - Other >> Feb 19, 2017  2:44 PM Windy KalataMichael, Taylor L, NT wrote: Reason for CRM: pt son states that the pt was supposed to get another prescription sent in since they are going to update pt thyroid medication. Please advise, and contact pt

## 2017-02-21 MED ORDER — LEVOTHYROXINE SODIUM 75 MCG PO TABS: 75.0000 ug | ORAL_TABLET | Freq: Every day | ORAL | 2 refills | Status: DC

## 2017-02-21 NOTE — Telephone Encounter (Signed)
Prescription for levothyroxine 75 mcg once every morning has been sent to her pharmacy

## 2017-02-23 NOTE — Telephone Encounter (Signed)
Left a message on patient's voicemail to inform her of prescription of Levothyroxine sent to her pharmacy.

## 2017-02-24 ENCOUNTER — Other Ambulatory Visit: Payer: Self-pay

## 2017-03-11 ENCOUNTER — Ambulatory Visit: Payer: Medicare Other | Admitting: Family Medicine

## 2017-03-24 ENCOUNTER — Ambulatory Visit: Payer: Medicare Other | Admitting: Family Medicine

## 2017-03-24 ENCOUNTER — Encounter: Payer: Self-pay | Admitting: Family Medicine

## 2017-03-24 VITALS — BP 142/68 | HR 75 | Temp 98.4°F | Resp 14 | Wt 152.1 lb

## 2017-03-24 DIAGNOSIS — I10 Essential (primary) hypertension: Secondary | ICD-10-CM

## 2017-03-24 DIAGNOSIS — E079 Disorder of thyroid, unspecified: Secondary | ICD-10-CM | POA: Diagnosis not present

## 2017-03-24 LAB — TSH: TSH: 1.62 m[IU]/L (ref 0.40–4.50)

## 2017-03-24 MED ORDER — METOPROLOL SUCCINATE ER 25 MG PO TB24: ORAL_TABLET | ORAL | 0 refills | Status: DC

## 2017-03-24 NOTE — Progress Notes (Signed)
Name: [name]   MRN: [id number]    DOB: Apr 01, 1927   Date:03/24/2017       Progress Note  Subjective  Chief Complaint  Chief Complaint  Patient presents with  . Follow-up    1 MONTH  . Hypertension    UPED DOSE  . Hypothyroidism    UPED DOSE    Hypertension  This is a chronic problem. The problem has been gradually improving since onset. Pertinent negatives include no blurred vision, chest pain, headaches or palpitations. Past treatments include beta blockers. Identifiable causes of hypertension include a thyroid problem.  Thyroid Problem  Presents for follow-up visit. Patient reports no cold intolerance, constipation, depressed mood, diarrhea, dry skin, fatigue, hair loss or palpitations. The symptoms have been resolved.      Past Medical History:  Diagnosis Date  . Osteoporosis   . Thyroid disease     Past Surgical History:  Procedure Laterality Date  . NO PAST SURGERIES      No family history on file.  Social History   Socioeconomic History  . Marital status: Widowed    Spouse name: Not on file  . Number of children: Not on file  . Years of education: Not on file  . Highest education level: Not on file  Social Needs  . Financial resource strain: Not on file  . Food insecurity - worry: Not on file  . Food insecurity - inability: Not on file  . Transportation needs - medical: Not on file  . Transportation needs - non-medical: Not on file  Occupational History  . Not on file  Tobacco Use  . Smoking status: Never Smoker  . Smokeless tobacco: Never Used  Substance and Sexual Activity  . Alcohol use: No    Alcohol/week: 0.0 oz  . Drug use: No  . Sexual activity: No  Other Topics Concern  . Not on file  Social History Narrative  . Not on file     Current Outpatient Medications:  .  aspirin EC 81 MG tablet, Take by mouth., Disp: , Rfl:  .  bimatoprost (LUMIGAN) 0.01 % SOLN, , Disp: , Rfl:  .  brinzolamide (AZOPT) 1 % ophthalmic suspension,  , Disp: , Rfl:  .  Calcium Carbonate-Vitamin D (CALCIUM HIGH POTENCY/VITAMIN D) 600-200 MG-UNIT TABS, Take by mouth., Disp: , Rfl:  .  levothyroxine (SYNTHROID, LEVOTHROID) 75 MCG tablet, Take 1 tablet (75 mcg total) by mouth daily before breakfast., Disp: 30 tablet, Rfl: 2 .  memantine (NAMENDA) 5 MG tablet, Take 1 tablet (5 mg total) by mouth 2 (two) times daily., Disp: 180 tablet, Rfl: 1 .  metoprolol succinate (TOPROL-XL) 25 MG 24 hr tablet, Take 1 tab po qAM and po qHS, Disp: 270 tablet, Rfl: 0 .  Multiple Vitamins-Minerals (MULTIVITAMIN ADULT) TABS, Take by mouth., Disp: , Rfl:  .  rosuvastatin (CRESTOR) 5 MG tablet, Take 1 tablet (5 mg total) at bedtime by mouth., Disp: 90 tablet, Rfl: 0 .  VITAMIN D, ERGOCALCIFEROL, PO, Take 2,000 tablets by mouth daily. , Disp: , Rfl:  .  alendronate (FOSAMAX) 70 MG tablet, TAKE ONE TABLET BY MOUTH ONCE A WEEK TAKE  WITH  A  FULL  GLASS  OF  WATER  ON  AN  EMPTY  STOMACH (Patient not taking: Reported on 03/24/2017), Disp: 4 tablet, Rfl: 0  No Known Allergies   Review of Systems  Constitutional: Negative for fatigue.  Eyes: Negative for blurred vision.  Cardiovascular: Negative for chest pain and palpitations.  Gastrointestinal: Negative for constipation and diarrhea.  Neurological: Negative for headaches.  Endo/Heme/Allergies: Negative for cold intolerance.      Objective  Vitals:   03/24/17 1531  BP: (!) 142/68  Pulse: 75  Resp: 14  Temp: 98.4 F (36.9 C)  TempSrc: Oral  SpO2: 96%  Weight: 152 lb 1.6 oz (69 kg)    Physical Exam  Constitutional: She is well-developed, well-nourished, and in no distress.  HENT:  Head: Normocephalic and atraumatic.  Cardiovascular: Normal rate, regular rhythm and normal heart sounds.  No murmur heard. Pulmonary/Chest: Effort normal and breath sounds normal. She has no wheezes.  Abdominal: Soft. Bowel sounds are normal.  Musculoskeletal: She exhibits no edema.  Neurological: She is alert.  Nursing  note and vitals reviewed.      Assessment & Plan  1. Essential hypertension Blood pressure is significantly improved from last month, continue to take metoprolol as prescribed - metoprolol succinate (TOPROL-XL) 25 MG 24 hr tablet; Take 1 tab po qAM and 2 tabs po qHS  Dispense: 270 tablet; Refill: 0  2. Thyroid disease Obtain TSH, adjust levothyroxine as appropriate - TSH   Londa Mackowski Asad A. Faylene KurtzShah [facility] 03/24/2017 3:48 PM

## 2017-03-26 ENCOUNTER — Telehealth: Payer: Self-pay

## 2017-03-29 NOTE — Telephone Encounter (Signed)
erroneous

## 2017-04-28 ENCOUNTER — Other Ambulatory Visit: Payer: Self-pay | Admitting: Family Medicine

## 2017-04-28 DIAGNOSIS — E782 Mixed hyperlipidemia: Secondary | ICD-10-CM

## 2017-06-07 ENCOUNTER — Other Ambulatory Visit: Payer: Self-pay

## 2017-06-07 DIAGNOSIS — E079 Disorder of thyroid, unspecified: Secondary | ICD-10-CM

## 2017-06-08 MED ORDER — LEVOTHYROXINE SODIUM 75 MCG PO TABS: 75.0000 ug | ORAL_TABLET | Freq: Every day | ORAL | 9 refills | Status: DC

## 2017-06-08 NOTE — Telephone Encounter (Signed)
Lab Results  Component Value Date   TSH 1.62 03/24/2017

## 2017-06-22 ENCOUNTER — Ambulatory Visit (INDEPENDENT_AMBULATORY_CARE_PROVIDER_SITE_OTHER): Payer: Medicare Other | Admitting: Family Medicine

## 2017-06-22 ENCOUNTER — Encounter: Payer: Self-pay | Admitting: Family Medicine

## 2017-06-22 VITALS — BP 136/78 | HR 66 | Temp 97.9°F | Resp 14 | Ht 66.0 in | Wt 149.0 lb

## 2017-06-22 DIAGNOSIS — R739 Hyperglycemia, unspecified: Secondary | ICD-10-CM | POA: Diagnosis not present

## 2017-06-22 DIAGNOSIS — Z23 Encounter for immunization: Secondary | ICD-10-CM

## 2017-06-22 DIAGNOSIS — M8589 Other specified disorders of bone density and structure, multiple sites: Secondary | ICD-10-CM

## 2017-06-22 DIAGNOSIS — E782 Mixed hyperlipidemia: Secondary | ICD-10-CM

## 2017-06-22 DIAGNOSIS — M8580 Other specified disorders of bone density and structure, unspecified site: Secondary | ICD-10-CM | POA: Insufficient documentation

## 2017-06-22 DIAGNOSIS — I10 Essential (primary) hypertension: Secondary | ICD-10-CM | POA: Diagnosis not present

## 2017-06-22 MED ORDER — METOPROLOL SUCCINATE ER 25 MG PO TB24: ORAL_TABLET | ORAL | 1 refills | Status: DC

## 2017-06-22 MED ORDER — ROSUVASTATIN CALCIUM 5 MG PO TABS: 5.0000 mg | ORAL_TABLET | Freq: Every day | ORAL | 1 refills | Status: DC

## 2017-06-22 NOTE — Progress Notes (Signed)
BP 136/78   Pulse 66   Temp 97.9 F (36.6 C) (Oral)   Resp 14   Ht 5\' 6"  (1.676 m)   Wt 149 lb (67.6 kg)   SpO2 95%   BMI 24.05 kg/m    Subjective:    Patient ID: [name], female    DOB: [birth year], 84 y.o.   MRN: [id number]  HPI: [name] is a 84 y.o. female  Chief Complaint  Patient presents with  . Follow-up   HPI  Patient is new to me; previous provider left our practice  Osteoporosis, improved to osteopenia in June 2018; getting some greens, but not as much; does eat cheese, doesn't drink milk; OTC calcium and vit D; not sure how long on fosamax  HTN; controlled today; checked BP away from the doctor for a while, but not for a while; stable on medication; used to be pretty high  Hyperlipidemia; sister has high cholesterol too; on statin  Thyroid disease Lab Results  Component Value Date   TSH 1.62 03/24/2017  energy level is good; moving bowels okay  Not sure if she has had pneumonia vaccines  Dementia; namenda is working okay; no wandering behavior; does housework; she does not drive  High glucose; no hx of diabetes  [facility] 06/22/2017 02/10/2017 08/26/2016 07/27/2016 12/09/2015  Decreased Interest 0 0 0 0 0  Down, Depressed, Hopeless 0 0 0 0 0  PHQ - 2 Score 0 0 0 0 0    Relevant past medical, surgical, family and social history reviewed Past Medical History:  Diagnosis Date  . HTN (hypertension) 02/10/2017  . Osteoporosis   . Thyroid disease    Past Surgical History:  Procedure Laterality Date  . NO PAST SURGERIES     Family History  Problem Relation Age of Onset  . Hyperlipidemia Sister   . Heart disease Brother   . Stroke Neg Hx      Social History   Tobacco Use  . Smoking status: Never Smoker  . Smokeless tobacco: Never Used  Substance Use Topics  . Alcohol use: No    Alcohol/week: 0.0 oz  . Drug use: No   Interim medical history since last visit reviewed. Allergies and medications  reviewed  Review of Systems Per HPI unless specifically indicated above     Objective:    BP 136/78   Pulse 66   Temp 97.9 F (36.6 C) (Oral)   Resp 14   Ht 5\' 6"  (1.676 m)   Wt 149 lb (67.6 kg)   SpO2 95%   BMI 24.05 kg/m   Wt Readings from Last 3 Encounters:  06/22/17 149 lb (67.6 kg)  03/24/17 152 lb 1.6 oz (69 kg)  02/10/17 150 lb 8 oz (68.3 kg)    Physical Exam  Constitutional: She appears well-developed and well-nourished. No distress.  HENT:  Head: Normocephalic and atraumatic.  Eyes: EOM are normal. No scleral icterus.  Neck: No thyromegaly present.  Cardiovascular: Normal rate, regular rhythm and normal heart sounds.  No murmur heard. Pulmonary/Chest: Effort normal and breath sounds normal. No respiratory distress. She has no wheezes.  Abdominal: Soft. Bowel sounds are normal. She exhibits no distension.  Musculoskeletal: Normal range of motion. She exhibits no edema.  Neurological: She is alert. She exhibits normal muscle tone.  Skin: Skin is warm and dry. Lesion: indurated lesion dorsum of RIGHT hand near wrist. She is not diaphoretic. No pallor.     Psychiatric: She has a  normal mood and affect. Her behavior is normal. Judgment and thought content normal.    Results for orders placed or performed in visit on 03/24/17  TSH  Result Value Ref Range   TSH 1.62 0.40 - 4.50 mIU/L      Assessment & Plan:   Problem List Items Addressed This Visit      Cardiovascular and Mediastinum   HTN (hypertension) - Primary    Not quite to goal; try DASH guidelines      Relevant Medications   rosuvastatin (CRESTOR) 5 MG tablet   metoprolol succinate (TOPROL-XL) 25 MG 24 hr tablet     Musculoskeletal and Integument   Osteopenia    Check to see how long on bisphosphonate; fall precautions; calcium and vit D        Other   Hyperlipidemia   Relevant Medications   rosuvastatin (CRESTOR) 5 MG tablet   metoprolol succinate (TOPROL-XL) 25 MG 24 hr tablet    Hyperglycemia    Check A1c here today; ordered, but not done; will ask patient to return for A1c POCT       Other Visit Diagnoses    Need for vaccination with 13-polyvalent pneumococcal conjugate vaccine       Relevant Orders   Pneumococcal conjugate vaccine 13-valent IM (Completed)       Follow up plan: Return in about 3 months (around 09/21/2017) for follow-up visit with Dr. Sherie Don.  An after-visit summary was printed and given to the patient at check-out.  Please see the patient instructions which may contain other information and recommendations beyond what is mentioned above in the assessment and plan.  Meds ordered this encounter  Medications  . rosuvastatin (CRESTOR) 5 MG tablet    Sig: Take 1 tablet (5 mg total) by mouth at bedtime.    Dispense:  90 tablet    Refill:  1  . metoprolol succinate (TOPROL-XL) 25 MG 24 hr tablet    Sig: Take one pill in the morning and two pills at night    Dispense:  270 tablet    Refill:  1    Orders Placed This Encounter  Procedures  . Pneumococcal conjugate vaccine 13-valent IM

## 2017-06-22 NOTE — Patient Instructions (Addendum)
Try to follow the DASH guidelines (DASH stands for Dietary Approaches to Stop Hypertension). Try to limit the sodium in your diet to no more than 1,500mg  of sodium per day. Certainly try to not exceed 2,000 mg per day at the very most. Do not add salt when cooking or at the table.  Check the sodium amount on labels when shopping, and choose items lower in sodium when given a choice. Avoid or limit foods that already contain a lot of sodium. Eat a diet rich in fruits and vegetables and whole grains, and try to lose weight if overweight or obese Try almond milk to get a little more calcium in your diet If the place on your right hand is not completely gone in two week, call me for a referral to a skin doctor; that might be a skin cancer Stop the alendronate (Fosamax) For one week, increase the Namenda (memantine) to 5 mg every morning and 10 mg every night, then take 10 mg twice a day Call me at the end of the 5 mg bottle and we'll get you the new strength of the 10 mg pills You'll be due for your next bone density test on or afte September 02, 2018 Start taking vitamin B12 250 or 500 mcg daily to help boost memory

## 2017-06-22 NOTE — Assessment & Plan Note (Addendum)
Check A1c here today; ordered, but not done; will ask patient to return for A1c POCT

## 2017-06-22 NOTE — Assessment & Plan Note (Signed)
Not quite to goal; try DASH guidelines

## 2017-06-22 NOTE — Assessment & Plan Note (Signed)
Check to see how long on bisphosphonate; fall precautions; calcium and vit D

## 2017-06-30 DIAGNOSIS — H40153 Residual stage of open-angle glaucoma, bilateral: Secondary | ICD-10-CM | POA: Diagnosis not present

## 2017-07-05 ENCOUNTER — Telehealth: Payer: Self-pay | Admitting: Family Medicine

## 2017-07-05 NOTE — Telephone Encounter (Signed)
Pt son notified.

## 2017-07-05 NOTE — Telephone Encounter (Signed)
Please ask patient to come by for a POCT A1c Thank you

## 2017-08-30 DIAGNOSIS — E785 Hyperlipidemia, unspecified: Secondary | ICD-10-CM | POA: Diagnosis not present

## 2017-08-30 DIAGNOSIS — I10 Essential (primary) hypertension: Secondary | ICD-10-CM | POA: Diagnosis not present

## 2017-08-30 DIAGNOSIS — I351 Nonrheumatic aortic (valve) insufficiency: Secondary | ICD-10-CM | POA: Diagnosis not present

## 2017-09-24 ENCOUNTER — Ambulatory Visit: Payer: Medicare Other | Admitting: Family Medicine

## 2017-10-01 ENCOUNTER — Ambulatory Visit (INDEPENDENT_AMBULATORY_CARE_PROVIDER_SITE_OTHER): Payer: Medicare Other | Admitting: Family Medicine

## 2017-10-01 ENCOUNTER — Ambulatory Visit: Payer: Medicare Other

## 2017-10-01 VITALS — BP 130/60 | HR 65 | Temp 97.8°F | Resp 12 | Ht 66.0 in | Wt 142.0 lb

## 2017-10-01 VITALS — BP 130/60 | HR 65 | Temp 97.8°F | Resp 12 | Ht 64.76 in | Wt 142.0 lb

## 2017-10-01 DIAGNOSIS — G301 Alzheimer's disease with late onset: Secondary | ICD-10-CM | POA: Diagnosis not present

## 2017-10-01 DIAGNOSIS — E782 Mixed hyperlipidemia: Secondary | ICD-10-CM

## 2017-10-01 DIAGNOSIS — Z0000 Encounter for general adult medical examination without abnormal findings: Secondary | ICD-10-CM

## 2017-10-01 DIAGNOSIS — R739 Hyperglycemia, unspecified: Secondary | ICD-10-CM

## 2017-10-01 DIAGNOSIS — M8589 Other specified disorders of bone density and structure, multiple sites: Secondary | ICD-10-CM | POA: Diagnosis not present

## 2017-10-01 DIAGNOSIS — E079 Disorder of thyroid, unspecified: Secondary | ICD-10-CM

## 2017-10-01 DIAGNOSIS — I10 Essential (primary) hypertension: Secondary | ICD-10-CM

## 2017-10-01 DIAGNOSIS — F0280 Dementia in other diseases classified elsewhere without behavioral disturbance: Secondary | ICD-10-CM

## 2017-10-01 DIAGNOSIS — Z5181 Encounter for therapeutic drug level monitoring: Secondary | ICD-10-CM

## 2017-10-01 NOTE — Progress Notes (Addendum)
Subjective:   [name] is a 84 y.o. female who presents for Medicare Annual (Subsequent) preventive examination.  Review of Systems:   N/A Cardiac Risk Factors include: advanced age (>46men, >63 women);hypertension;sedentary lifestyle;dyslipidemia     Objective:     Vitals: BP 130/60 (BP Location: Left Arm, Patient Position: Sitting, Cuff Size: Normal)   Pulse 65   Temp 97.8 F (36.6 C) (Oral)   Resp 12   Ht 5\' 6"  (1.676 m)   Wt 142 lb (64.4 kg)   SpO2 96%   BMI 22.92 kg/m   Body mass index is 22.92 kg/m.  Advanced Directives 10/01/2017 08/26/2016 07/27/2016 06/29/2016 04/29/2016 12/09/2015 11/04/2015  Does Patient Have a Medical Advance Directive? Yes Yes Yes No No Yes Yes  Type of Estate agent of [place];Living will Healthcare Power of [place];Living will Healthcare Power of [place];Living will - - Healthcare Power of [place];Living will Healthcare Power of [place];Living will  Does patient want to make changes to medical advance directive? - - - - - No - Patient declined -  Copy of Healthcare Power of Attorney in Chart? Yes No - copy requested No - copy requested - - No - copy requested No - copy requested    Tobacco Social History   Tobacco Use  Smoking Status Never Smoker  Smokeless Tobacco Never Used  Tobacco Comment   smoking cessation materials not required     Counseling given: No Comment: smoking cessation materials not required  Clinical Intake:  Pre-visit preparation completed: Yes  Pain : No/denies pain   BMI - recorded: 22.92 Nutritional Status: BMI of 19-24  Normal Nutritional Risks: None Diabetes: No  How often do you need to have someone help you when you read instructions, pamphlets, or other written materials from your doctor or pharmacy?: 1 - Never  Interpreter Needed?: No  Information entered by :: AEversole, LPN  Past Medical History:  Diagnosis Date  . HTN (hypertension) 02/10/2017  . Osteoporosis    . Thyroid disease    Past Surgical History:  Procedure Laterality Date  . NO PAST SURGERIES     Family History  Problem Relation Age of Onset  . Cancer Father   . Hyperlipidemia Sister   . Heart disease Brother   . Stroke Neg Hx    Social History   Socioeconomic History  . Marital status: Widowed    Spouse name: Not on file  . Number of children: 6  . Years of education: Not on file  . Highest education level: 10th grade  Occupational History    Employer: RETIRED  Social Needs  . Financial resource strain: Not hard at all  . Food insecurity:    Worry: Never true    Inability: Never true  . Transportation needs:    Medical: No    Non-medical: No  Tobacco Use  . Smoking status: Never Smoker  . Smokeless tobacco: Never Used  . Tobacco comment: smoking cessation materials not required  Substance and Sexual Activity  . Alcohol use: No    Alcohol/week: 0.0 oz  . Drug use: No  . Sexual activity: Not Currently  Lifestyle  . Physical activity:    Days per week: 0 days    Minutes per session: 0 min  . Stress: Not at all  Relationships  . Social connections:    Talks on phone: Patient refused    Gets together: Patient refused    Attends religious service: Patient refused    Active member  of club or organization: Patient refused    Attends meetings of clubs or organizations: Patient refused    Relationship status: Widowed  Other Topics Concern  . Not on file  Social History Narrative  . Not on file    Outpatient Encounter Medications as of 10/01/2017  Medication Sig  . aspirin EC 81 MG tablet Take by mouth.  . bimatoprost (LUMIGAN) 0.01 % SOLN Place 1 drop into both eyes at bedtime.   . brinzolamide (AZOPT) 1 % ophthalmic suspension Place 1 drop into both eyes 2 (two) times daily.   . Calcium Carbonate-Vitamin D (CALCIUM HIGH POTENCY/VITAMIN D) 600-200 MG-UNIT TABS Take by mouth.  . levothyroxine (SYNTHROID, LEVOTHROID) 75 MCG tablet Take 1 tablet (75 mcg total)  by mouth daily before breakfast.  . memantine (NAMENDA) 5 MG tablet Take 1 tablet (5 mg total) by mouth 2 (two) times daily.  . metoprolol succinate (TOPROL-XL) 25 MG 24 hr tablet Take one pill in the morning and two pills at night  . Multiple Vitamins-Minerals (MULTIVITAMIN ADULT) TABS Take by mouth.  . rosuvastatin (CRESTOR) 5 MG tablet Take 1 tablet (5 mg total) by mouth at bedtime.  Marland Kitchen VITAMIN D, ERGOCALCIFEROL, PO Take 2,000 tablets by mouth daily.    No facility-administered encounter medications on file as of 10/01/2017.     Activities of Daily Living In your present state of health, do you have any difficulty performing the following activities: 10/01/2017 06/22/2017  Hearing? N N  Comment denies hearing aids; tinnitus -  Vision? N N  Comment wears eyeglasses; glaucoma -  Difficulty concentrating or making decisions? Y N  Comment short and long term memory loss; dementia -  Walking or climbing stairs? Y N  Comment fatigue -  Dressing or bathing? N N  Doing errands, shopping? Y N  Comment son transports -  Quarry manager and eating ? N -  Comment denies dentures -  Using the Toilet? N -  In the past six months, have you accidently leaked urine? Y -  Comment stress incontinence -  Do you have problems with loss of bowel control? N -  Managing your Medications? Y -  Comment son manages -  Managing your Finances? Y -  Comment son manages -  Housekeeping or managing your Housekeeping? Y -  Comment son manages -  Some recent data might be hidden    Patient Care Team: Lada, Janit Bern, MD as PCP - General (Family Medicine) Irene Limbo., MD as Consulting Physician (Ophthalmology) Marcina Millard, MD as Consulting Physician (Cardiology)    Assessment:   This is a routine wellness examination for Latiffany.  Exercise Activities and Dietary recommendations Current Exercise Habits: The patient does not participate in regular exercise at present, Exercise limited by:  Other - see comments(dementia)  Goals    . DIET - INCREASE WATER INTAKE     Recommend to drink at least 6-8 8oz glasses of water per day.    . Prevent falls     Recommend to remove any items from the home that may cause slips or trips.       Fall Risk Fall Risk  10/01/2017 06/22/2017 02/10/2017 08/26/2016 07/27/2016  Falls in the past year? No No No No No  Risk for fall due to : Impaired vision;Other (Comment);Mental status change - - - -  Risk for fall due to: Comment wears eyeglasses; tinnitus; glaucoma; dementia - - - -   FALL RISK PREVENTION PERTAINING TO HOME: Is your home  free of loose throw rugs in walkways, pet beds, electrical cords, etc? Yes Is there adequate lighting in your home to reduce risk of falls?  Yes Are there stairs in or around your home WITH handrails? Yes  ASSISTIVE DEVICES UTILIZED TO PREVENT FALLS: Use of a cane, walker or w/c? No Grab bars in the bathroom? Yes  Shower chair or a place to sit while bathing? No An elevated toilet seat or a handicapped toilet? Yes  Timed Get Up and Go Performed: Yes. Pt ambulated 10 feet within 22 sec. Gait slow, steady and without the use of an assistive device. No intervention required at this time. Fall risk prevention has been discussed.  Community Resource Referral:  Pt and son declined my offer to send [employer] Referral to Care Guide for a shower chair.  Depression Screen PHQ 2/9 Scores 10/01/2017 06/22/2017 02/10/2017 08/26/2016  PHQ - 2 Score 0 0 0 0  PHQ- 9 Score 0 - - -     Cognitive Function     6CIT Screen 10/01/2017 06/22/2017 07/27/2016  What Year? 4 points 4 points 4 points  What month? 3 points 0 points 3 points  What time? 0 points 0 points 0 points  Count back from 20 4 points 4 points 0 points  Months in reverse 4 points 4 points 4 points  Repeat phrase 10 points 10 points 8 points  Total Score 25 22 19     Immunization History  Administered Date(s) Administered  . Influenza, High Dose  Seasonal PF 12/28/2015, 12/26/2016  . Influenza-Unspecified 12/26/2016  . Pneumococcal Conjugate-13 06/22/2017    Qualifies for Shingles Vaccine? Yes. Due for Shingrix. Education has been provided regarding the importance of this vaccine. Pt and son have been advised to call insurance company to determine out of pocket expense. Advised may also receive vaccine at local pharmacy or Health Dept. Verbalized acceptance and understanding.  Due for Tdap vaccine. Education has been provided regarding the importance of this vaccine. Advised pt and son may receive this vaccine at local pharmacy or Health Dept. Aware to provide a copy of the vaccination record if obtained from local pharmacy or Health Dept. Verbalized acceptance and understanding.   Screening Tests Health Maintenance  Topic Date Due  . TETANUS/TDAP  03/16/2026 (Originally 02/24/1947)  . INFLUENZA VACCINE  10/14/2017  . PNA vac Low Risk Adult (2 of 2 - PPSV23) 06/23/2018  . DEXA SCAN  Completed    Cancer Screenings: Lung: Low Dose CT Chest recommended if Age 40-80 years, 30 pack-year currently smoking OR have quit w/in 15years. Patient does not qualify. Breast Screening: No longer required   Up to date of Bone Density/Dexa? Yes. Completed 08/31/16. Results reflect osteopenia. Repeat every 2 years Colorectal: No longer required  Additional Screenings: Hepatitis C Screening: Does not qualify    Plan:  I have personally reviewed and addressed the Medicare Annual Wellness questionnaire and have noted the following in the patient's chart:  A. Medical and social history B. Use of alcohol, tobacco or illicit drugs  C. Current medications and supplements D. Functional ability and status E.  Nutritional status F.  Physical activity G. Advance directives H. List of other physicians I.  Hospitalizations, surgeries, and ER visits in previous 12 months J.  Vitals K. Screenings such as hearing and vision if needed, cognitive and  depression L. Referrals and appointments  In addition, I have reviewed and discussed with patient certain preventive protocols, quality metrics, and best practice recommendations. A written personalized  care plan for preventive services as well as general preventive health recommendations were provided to patient.  See attached scanned questionnaire for additional information.   Signed,  Deon PillingAmmie Keniya Schlotterbeck, LPN Nurse Health Advisor  ------------------------------------ I have reviewed this encounter including the documentation in this note and/or discussed this patient with the provider, Deon PillingAmmie Joshalyn Ancheta, LPN. I am certifying that I agree with the content of this note as supervising physician. Baruch GoutyMelinda Lada, MD [facility]

## 2017-10-01 NOTE — Patient Instructions (Addendum)
[name] , Thank you for taking time to come for your Medicare Wellness Visit. I appreciate your ongoing commitment to your health goals. Please review the following plan we discussed and let me know if I can assist you in the future.   Screening recommendations/referrals: Colorectal Screening: No longer required Mammogram: No longer required Bone Density: Up to date  Vision and Dental Exams: Recommended annual ophthalmology exams for early detection of glaucoma and other disorders of the eye Recommended annual dental exams for proper oral hygiene  Vaccinations: Influenza vaccine: Up to date Pneumococcal vaccine: Up to date Tdap vaccine: Declined. Please call your insurance company to determine your out of pocket expense. You may also receive this vaccine at your local pharmacy or Health Dept. Shingles vaccine: Please call your insurance company to determine your out of pocket expense for the Shingrix vaccine. You may also receive this vaccine at your local pharmacy or Health Dept.  Advanced directives: We have received a copy of your POA (Power of [place]) and/or Living Will. These documents can be located in your chart.  Goals: Recommend to drink at least 6-8 8oz glasses of water per day.  Next appointment: Please schedule your Annual Wellness Visit with your Nurse Health Advisor in one year.  Preventive Care 3765 Years and Older, Female Preventive care refers to lifestyle choices and visits with your health care provider that can promote health and wellness. What does preventive care include?  A yearly physical exam. This is also called an annual well check.  Dental exams once or twice a year.  Routine eye exams. Ask your health care provider how often you should have your eyes checked.  Personal lifestyle choices, including:  Daily care of your teeth and gums.  Regular physical activity.  Eating a healthy diet.  Avoiding tobacco and drug use.  Limiting alcohol  use.  Practicing safe sex.  Taking low-dose aspirin every day.  Taking vitamin and mineral supplements as recommended by your health care provider. What happens during an annual well check? The services and screenings done by your health care provider during your annual well check will depend on your age, overall health, lifestyle risk factors, and family history of disease. Counseling  Your health care provider may ask you questions about your:  Alcohol use.  Tobacco use.  Drug use.  Emotional well-being.  Home and relationship well-being.  Sexual activity.  Eating habits.  History of falls.  Memory and ability to understand (cognition).  Work and work Astronomerenvironment.  Reproductive health. Screening  You may have the following tests or measurements:  Height, weight, and BMI.  Blood pressure.  Lipid and cholesterol levels. These may be checked every 5 years, or more frequently if you are over 706 years old.  Skin check.  Lung cancer screening. You may have this screening every year starting at age 82 if you have a 30-pack-year history of smoking and currently smoke or have quit within the past 15 years.  Fecal occult blood test (FOBT) of the stool. You may have this test every year starting at age 82.  Flexible sigmoidoscopy or colonoscopy. You may have a sigmoidoscopy every 5 years or a colonoscopy every 10 years starting at age 82.  Hepatitis C blood test.  Hepatitis B blood test.  Sexually transmitted disease (STD) testing.  Diabetes screening. This is done by checking your blood sugar (glucose) after you have not eaten for a while (fasting). You may have this done every 1-3 years.  Bone  density scan. This is done to screen for osteoporosis. You may have this done starting at age 22.  Mammogram. This may be done every 1-2 years. Talk to your health care provider about how often you should have regular mammograms. Talk with your health care provider about  your test results, treatment options, and if necessary, the need for more tests. Vaccines  Your health care provider may recommend certain vaccines, such as:  Influenza vaccine. This is recommended every year.  Tetanus, diphtheria, and acellular pertussis (Tdap, Td) vaccine. You may need a Td booster every 10 years.  Zoster vaccine. You may need this after age 69.  Pneumococcal 13-valent conjugate (PCV13) vaccine. One dose is recommended after age 51.  Pneumococcal polysaccharide (PPSV23) vaccine. One dose is recommended after age 55. Talk to your health care provider about which screenings and vaccines you need and how often you need them. This information is not intended to replace advice given to you by your health care provider. Make sure you discuss any questions you have with your health care provider. Document Released: 03/29/2015 Document Revised: 11/20/2015 Document Reviewed: 01/01/2015 Elsevier Interactive Patient Education  2017 [employer] Prevention in the Home Falls can cause injuries. They can happen to people of all ages. There are many things you can do to make your home safe and to help prevent falls. What can I do on the outside of my home?  Regularly fix the edges of walkways and driveways and fix any cracks.  Remove anything that might make you trip as you walk through a door, such as a raised step or threshold.  Trim any bushes or trees on the path to your home.  Use bright outdoor lighting.  Clear any walking paths of anything that might make someone trip, such as rocks or tools.  Regularly check to see if handrails are loose or broken. Make sure that both sides of any steps have handrails.  Any raised decks and porches should have guardrails on the edges.  Have any leaves, snow, or ice cleared regularly.  Use sand or salt on walking paths during winter.  Clean up any spills in your garage right away. This includes oil or grease spills. What  can I do in the bathroom?  Use night lights.  Install grab bars by the toilet and in the tub and shower. Do not use towel bars as grab bars.  Use non-skid mats or decals in the tub or shower.  If you need to sit down in the shower, use a plastic, non-slip stool.  Keep the floor dry. Clean up any water that spills on the floor as soon as it happens.  Remove soap buildup in the tub or shower regularly.  Attach bath mats securely with double-sided non-slip rug tape.  Do not have throw rugs and other things on the floor that can make you trip. What can I do in the bedroom?  Use night lights.  Make sure that you have a light by your bed that is easy to reach.  Do not use any sheets or blankets that are too big for your bed. They should not hang down onto the floor.  Have a firm chair that has side arms. You can use this for support while you get dressed.  Do not have throw rugs and other things on the floor that can make you trip. What can I do in the kitchen?  Clean up any spills right away.  Avoid walking on  wet floors.  Keep items that you use a lot in easy-to-reach places.  If you need to reach something above you, use a strong step stool that has a grab bar.  Keep electrical cords out of the way.  Do not use floor polish or wax that makes floors slippery. If you must use wax, use non-skid floor wax.  Do not have throw rugs and other things on the floor that can make you trip. What can I do with my stairs?  Do not leave any items on the stairs.  Make sure that there are handrails on both sides of the stairs and use them. Fix handrails that are broken or loose. Make sure that handrails are as long as the stairways.  Check any carpeting to make sure that it is firmly attached to the stairs. Fix any carpet that is loose or worn.  Avoid having throw rugs at the top or bottom of the stairs. If you do have throw rugs, attach them to the floor with carpet tape.  Make sure  that you have a light switch at the top of the stairs and the bottom of the stairs. If you do not have them, ask someone to add them for you. What else can I do to help prevent falls?  Wear shoes that:  Do not have high heels.  Have rubber bottoms.  Are comfortable and fit you well.  Are closed at the toe. Do not wear sandals.  If you use a stepladder:  Make sure that it is fully opened. Do not climb a closed stepladder.  Make sure that both sides of the stepladder are locked into place.  Ask someone to hold it for you, if possible.  Clearly mark and make sure that you can see:  Any grab bars or handrails.  First and last steps.  Where the edge of each step is.  Use tools that help you move around (mobility aids) if they are needed. These include:  Canes.  Walkers.  Scooters.  Crutches.  Turn on the lights when you go into a dark area. Replace any light bulbs as soon as they burn out.  Set up your furniture so you have a clear path. Avoid moving your furniture around.  If any of your floors are uneven, fix them.  If there are any pets around you, be aware of where they are.  Review your medicines with your doctor. Some medicines can make you feel dizzy. This can increase your chance of falling. Ask your doctor what other things that you can do to help prevent falls. This information is not intended to replace advice given to you by your health care provider. Make sure you discuss any questions you have with your health care provider. Document Released: 12/27/2008 Document Revised: 08/08/2015 Document Reviewed: 04/06/2014 Elsevier Interactive Patient Education  2017 [employer].

## 2017-10-01 NOTE — Progress Notes (Signed)
BP 130/60   Pulse 65   Temp 97.8 F (36.6 C) (Oral)   Resp 12   Ht 5' 4.76" (1.645 m)   Wt 142 lb (64.4 kg)   SpO2 96%   BMI 23.80 kg/m    Subjective:    Patient ID: [name] Tullius, female    DOB: 03-25-1927, 84 y.o.   MRN: [id number]  HPI: [name] Marciel is a 84 y.o. female  Chief Complaint  Patient presents with  . Hypertension    3 month follow up   . Hyperlipidemia  . Hyperglycemia  . Osteoporosis    HPI Patient is here with her son  She has high blood pressure; controlled; son used to check her BP at home back when it was uncontrolled; she is on the medicines now and doesn't overdo it, stays around the house; not much salt; tries to not put extra salt on the food  High cholesterol; taking rosuvastatin; trying to avoid fried foods; occasional egg on the weekends  High glucose; no dry mouth; no blurred vision  Glaucoma; last eye exam just a few months ago; vision is okay; does not limit her from doing anything  Osteopenia; next DEXA September 01, 2018  Hypothyroidism Lab Results  Component Value Date   TSH 1.62 03/24/2017   Dementia; gets aggravated at times; not watching the news; keeps a calendar by her chair  Not really taking aspirin any more  [facility] 2/9 10/01/2017 06/22/2017 02/10/2017 08/26/2016 07/27/2016  Decreased Interest 0 0 0 0 0  Down, Depressed, Hopeless 0 0 0 0 0  PHQ - 2 Score 0 0 0 0 0  Altered sleeping 0 - - - -  Tired, decreased energy 0 - - - -  Change in appetite 0 - - - -  Feeling bad or failure about yourself  0 - - - -  Trouble concentrating 0 - - - -  Moving slowly or fidgety/restless 0 - - - -  Suicidal thoughts 0 - - - -  PHQ-9 Score 0 - - - -  Difficult doing work/chores Not difficult at all - - - -    Relevant past medical, surgical, family and social history reviewed Past Medical History:  Diagnosis Date  . HTN (hypertension) 02/10/2017  . Osteoporosis   . Thyroid disease    Past Surgical  History:  Procedure Laterality Date  . NO PAST SURGERIES     Family History  Problem Relation Age of Onset  . Cancer Father   . Hyperlipidemia Sister   . Heart disease Brother   . Stroke Neg Hx    Social History   Tobacco Use  . Smoking status: Never Smoker  . Smokeless tobacco: Never Used  . Tobacco comment: smoking cessation materials not required  Substance Use Topics  . Alcohol use: No    Alcohol/week: 0.0 oz  . Drug use: No    Interim medical history since last visit reviewed. Allergies and medications reviewed  Review of Systems Per HPI unless specifically indicated above     Objective:    BP 130/60   Pulse 65   Temp 97.8 F (36.6 C) (Oral)   Resp 12   Ht 5' 4.76" (1.645 m)   Wt 142 lb (64.4 kg)   SpO2 96%   BMI 23.80 kg/m   Wt Readings from Last 3 Encounters:  10/01/17 142 lb (64.4 kg)  10/01/17 142 lb (64.4 kg)  06/22/17 149 lb (67.6 kg)  Physical Exam  Constitutional: She appears well-developed and well-nourished. No distress.  HENT:  Head: Normocephalic and atraumatic.  Eyes: EOM are normal. No scleral icterus.  Neck: No thyromegaly present.  Cardiovascular: Normal rate, regular rhythm and normal heart sounds.  No murmur heard. Pulmonary/Chest: Effort normal and breath sounds normal. No respiratory distress. She has no wheezes.  Abdominal: Soft. Bowel sounds are normal. She exhibits no distension.  Musculoskeletal: Normal range of motion. She exhibits no edema.  Neurological: She is alert. She exhibits normal muscle tone.  Does not know the year or the month  Skin: Skin is warm and dry. She is not diaphoretic. No pallor.  Psychiatric: She has a normal mood and affect. Her behavior is normal. Judgment and thought content normal. Her mood appears not anxious. She does not exhibit a depressed mood. She exhibits abnormal recent memory.  Very pleasant, good eye contact with examiner; smiling    Results for orders placed or performed in visit on  03/24/17  TSH  Result Value Ref Range   TSH 1.62 0.40 - 4.50 mIU/L      Assessment & Plan:   Problem List Items Addressed This Visit      Cardiovascular and Mediastinum   HTN (hypertension)    Controlled today        Endocrine   Thyroid disease    Last TSH normal      Relevant Orders   TSH     Nervous and Auditory   Dementia of the Alzheimer's type without behavioral disturbance - Primary   Relevant Medications   memantine (NAMENDA) 5 MG tablet     Musculoskeletal and Integument   Osteopenia    Next DEXA due June 2020; fall precautions discussed        Other   Hyperlipidemia    Continue statin; date not sufficient for statin use in her age group, but don't believe it is causing any harm; will continue      Relevant Orders   Lipid panel   Hyperglycemia    Check glucose and A1c in December; we mutually opted for no labs today as she is stable, asymptomatic      Relevant Orders   Hemoglobin A1c    Other Visit Diagnoses    Medication monitoring encounter       Relevant Orders   COMPLETE METABOLIC PANEL WITH GFR       Follow up plan: Return in about 4 months (around 02/14/2018) for follow-up visit with Dr. Sherie Don.  An after-visit summary was printed and given to the patient at check-out.  Please see the patient instructions which may contain other information and recommendations beyond what is mentioned above in the assessment and plan.  No orders of the defined types were placed in this encounter.   Orders Placed This Encounter  Procedures  . COMPLETE METABOLIC PANEL WITH GFR  . Lipid panel  . Hemoglobin A1c  . TSH

## 2017-10-01 NOTE — Patient Instructions (Addendum)
Increase the Namenda to 5 mg every morning and 10 mg every night for one week Then increase to 10 mg twice a day Call for a refill when needed and the new prescription will be for 10 mg pills  Let me know if you would like to see a dermatologist about the spots on your hand and arms  Return in early December for labs, and you do NOT need to be fasting

## 2017-10-06 NOTE — Assessment & Plan Note (Signed)
Last TSH normal.  °

## 2017-10-06 NOTE — Assessment & Plan Note (Signed)
Controlled today 

## 2017-10-06 NOTE — Assessment & Plan Note (Signed)
Continue statin; date not sufficient for statin use in her age group, but don't believe it is causing any harm; will continue

## 2017-10-06 NOTE — Assessment & Plan Note (Signed)
Next DEXA due June 2020; fall precautions discussed

## 2017-10-06 NOTE — Assessment & Plan Note (Signed)
Check glucose and A1c in December; we mutually opted for no labs today as she is stable, asymptomatic

## 2017-10-26 DIAGNOSIS — H40153 Residual stage of open-angle glaucoma, bilateral: Secondary | ICD-10-CM | POA: Diagnosis not present

## 2018-01-06 ENCOUNTER — Other Ambulatory Visit: Payer: Self-pay

## 2018-01-06 DIAGNOSIS — F0280 Dementia in other diseases classified elsewhere without behavioral disturbance: Secondary | ICD-10-CM

## 2018-01-06 DIAGNOSIS — G301 Alzheimer's disease with late onset: Principal | ICD-10-CM

## 2018-01-07 MED ORDER — MEMANTINE HCL 10 MG PO TABS: 10.0000 mg | ORAL_TABLET | Freq: Two times a day (BID) | ORAL | 5 refills | Status: DC

## 2018-01-19 ENCOUNTER — Other Ambulatory Visit: Payer: Self-pay | Admitting: Family Medicine

## 2018-01-19 DIAGNOSIS — E782 Mixed hyperlipidemia: Secondary | ICD-10-CM

## 2018-02-01 ENCOUNTER — Ambulatory Visit (INDEPENDENT_AMBULATORY_CARE_PROVIDER_SITE_OTHER): Payer: Medicare Other | Admitting: Family Medicine

## 2018-02-01 ENCOUNTER — Encounter: Payer: Self-pay | Admitting: Family Medicine

## 2018-02-01 VITALS — BP 126/78 | HR 66 | Temp 98.2°F | Ht 65.0 in | Wt 145.5 lb

## 2018-02-01 DIAGNOSIS — Z5181 Encounter for therapeutic drug level monitoring: Secondary | ICD-10-CM

## 2018-02-01 DIAGNOSIS — I10 Essential (primary) hypertension: Secondary | ICD-10-CM

## 2018-02-01 DIAGNOSIS — R739 Hyperglycemia, unspecified: Secondary | ICD-10-CM | POA: Diagnosis not present

## 2018-02-01 DIAGNOSIS — G301 Alzheimer's disease with late onset: Secondary | ICD-10-CM

## 2018-02-01 DIAGNOSIS — F0280 Dementia in other diseases classified elsewhere without behavioral disturbance: Secondary | ICD-10-CM

## 2018-02-01 DIAGNOSIS — E782 Mixed hyperlipidemia: Secondary | ICD-10-CM | POA: Diagnosis not present

## 2018-02-01 DIAGNOSIS — E079 Disorder of thyroid, unspecified: Secondary | ICD-10-CM | POA: Diagnosis not present

## 2018-02-01 NOTE — Progress Notes (Signed)
BP 126/78   Pulse 66   Temp 98.2 F (36.8 C) (Oral)   Ht 5\' 5"  (1.651 m)   Wt 145 lb 8 oz (66 kg)   SpO2 99%   BMI 24.21 kg/m    Subjective:    Patient ID: [name], female    DOB: 02/08/1928, 84 y.o.   MRN: [id number]  HPI: [name] is a 84 y.o. female  Chief Complaint  Patient presents with  . Follow-up    HPI Patient is here for follow-up; she had her medicare wellness visit on July 19th with Deon Pilling, LPN and saw me as well  She is feeling good; appetite is good; "eat, eat, eat" but son says she eats oatmeal and coffee in the morning, then goes out to dinner; nothing in between; she gets "snacks" like chocolate  She has alzheimer's; here with her son; son says she is off the wall sometimes; can't get nothing straight; forgets things easier; no wandering; she does not cook at home; she does not drive;   Thyroid disease; last TSH reviewed; chronic stable problem  Lab Results  Component Value Date   TSH 1.98 02/01/2018   HTN; doing better on medicine; chronic stable problem  High cholesterol; limiting hot dogs to no more than 2x a week; some cheese; no milk; some bacon or sausage like a weekend breakfast; on cholesterol medicines Lab Results  Component Value Date   CHOL 189 02/01/2018   HDL 84 02/01/2018   LDLCALC 86 02/01/2018   TRIG 97 02/01/2018   CHOLHDL 2.3 02/01/2018     She has non-rheumatoic aortic valve insufficiency; seeing [facility]ologist [facility] Nothing to worry about now; no chest pain; does go periodically for checks; going next month 2016 last echo _______________________________________________________________________________ INTERPRETATION NORMAL LEFT VENTRICULAR SYSTOLIC FUNCTION WITH MODERATE LVH MODERATE VALVULAR REGURGITATION (See above) NO VALVULAR STENOSIS  DOPPLER ECHO and OTHER SPECIAL PROCEDURES  Aortic:MODERATE AR No AS 169.0 cm/sec peak vel 11.4 mmHg  peak grad 6.0 mmHg mean grad   Mitral:MILD MR No MS 3.6 cm^2 by DOPPLER MV Inflow E Vel=38.5 cm/sec MV Annulus E'Vel=4.6 cm/sec E/E'Ratio=8.4  Tricuspid:MILD TR No TS 235.5 cm/sec peak TR vel32.2 mmHg peak RV pressure  Pulmonary:MILD PR No PS 85.5 cm/sec peak vel2.9 mmHg peak grad  -----------------------------------------   6CIT Screen 02/01/2018 10/01/2017 06/22/2017 07/27/2016  What Year? 4 points 4 points 4 points 4 points  What month? 3 points 3 points 0 points 3 points  What time? 0 points 0 points 0 points 0 points  Count back from 20 4 points 4 points 4 points 0 points  Months in reverse 4 points 4 points 4 points 4 points  Repeat phrase 10 points 10 points 10 points 8 points  Total Score 25 25 22 19       Depression screen [facility] 2/9 10/01/2017 06/22/2017 02/10/2017 08/26/2016 07/27/2016  Decreased Interest 0 0 0 0 0  Down, Depressed, Hopeless 0 0 0 0 0  PHQ - 2 Score 0 0 0 0 0  Altered sleeping 0 - - - -  Tired, decreased energy 0 - - - -  Change in appetite 0 - - - -  Feeling bad or failure about yourself  0 - - - -  Trouble concentrating 0 - - - -  Moving slowly or fidgety/restless 0 - - - -  Suicidal thoughts 0 - - - -  PHQ-9 Score 0 - - - -  Difficult doing work/chores Not  difficult at all - - - -   Fall Risk  02/01/2018 10/01/2017 06/22/2017 02/10/2017 08/26/2016  Falls in the past year? 0 No No No No  Number falls in past yr: 0 - - - -  Risk for fall due to : - Impaired vision;Other (Comment);Mental status change - - -  Risk for fall due to: Comment - wears eyeglasses; tinnitus; glaucoma; dementia - - -    Relevant past medical, surgical, family and social history reviewed Past Medical History:  Diagnosis Date  . HTN (hypertension) 02/10/2017  . Osteoporosis   . Thyroid disease    Past  Surgical History:  Procedure Laterality Date  . NO PAST SURGERIES     Family History  Problem Relation Age of Onset  . Cancer Father   . Hyperlipidemia Sister   . Heart disease Brother   . Stroke Neg Hx    Social History   Tobacco Use  . Smoking status: Never Smoker  . Smokeless tobacco: Never Used  . Tobacco comment: smoking cessation materials not required  Substance Use Topics  . Alcohol use: No    Alcohol/week: 0.0 standard drinks  . Drug use: No     [facility]  AUDIT-C Score  0      Interim medical history since last visit reviewed. Allergies and medications reviewed  Review of Systems Per HPI unless specifically indicated above     Objective:    BP 126/78   Pulse 66   Temp 98.2 F (36.8 C) (Oral)   Ht 5\' 5"  (1.651 m)   Wt 145 lb 8 oz (66 kg)   SpO2 99%   BMI 24.21 kg/m   Wt Readings from Last 3 Encounters:  02/01/18 145 lb 8 oz (66 kg)  10/01/17 142 lb (64.4 kg)  10/01/17 142 lb (64.4 kg)    Physical Exam  Constitutional: She appears well-developed and well-nourished.  HENT:  Mouth/Throat: Mucous membranes are normal.  Eyes: EOM are normal. No scleral icterus.  Cardiovascular: Normal rate and regular rhythm.  Pulmonary/Chest: Effort normal and breath sounds normal.  Psychiatric: She has a normal mood and affect. Her behavior is normal.   6CIT Screen 02/01/2018 10/01/2017 06/22/2017 07/27/2016  What Year? 4 points 4 points 4 points 4 points  What month? 3 points 3 points 0 points 3 points  What time? 0 points 0 points 0 points 0 points  Count back from 20 4 points 4 points 4 points 0 points  Months in reverse 4 points 4 points 4 points 4 points  Repeat phrase 10 points 10 points 10 points 8 points  Total Score 25 25 22 19         Assessment & Plan:   Problem List Items Addressed This Visit      Cardiovascular and Mediastinum   HTN (hypertension)    Well-controlled; limit salt; continue  medicine        Endocrine   Thyroid disease - Primary    Check TSH, since memory is continuing to decline, make sure adjustment in med not needed      Relevant Orders   TSH (Completed)     Nervous and Auditory   Dementia of the Alzheimer's type without behavioral disturbance (HCC)    Son does not wish for her to see a neurologist at this time; continue medicine        Other   Hyperlipidemia   Relevant Orders   Lipid panel (Completed)  Hyperglycemia    Other Visit Diagnoses    Medication monitoring encounter       Relevant Orders   CBC with Differential/Platelet (Completed)   COMPLETE METABOLIC PANEL WITH GFR (Completed)       Follow up plan: Return in about 4 months (around 06/02/2018).  An after-visit summary was printed and given to the patient at check-out.  Please see the patient instructions which may contain other information and recommendations beyond what is mentioned above in the assessment and plan.  No orders of the defined types were placed in this encounter.   Orders Placed This Encounter  Procedures  . Lipid panel  . TSH  . CBC with Differential/Platelet  . COMPLETE METABOLIC PANEL WITH GFR

## 2018-02-01 NOTE — Assessment & Plan Note (Signed)
Well-controlled; limit salt; continue medicine 

## 2018-02-01 NOTE — Patient Instructions (Addendum)
Try to eat something between oatmeal breakfast and evening meal We'll get labs today If you have not heard anything from my staff in a week about any orders/referrals/studies from today, please contact us here to follow-up [phone] Start taking a multivitamin for seniors that does not have extra iron Just once a day

## 2018-02-02 LAB — CBC WITH DIFFERENTIAL/PLATELET
Basophils Absolute: 94 cells/uL (ref 0–200)
Basophils Relative: 1.3 %
EOS PCT: 4.8 %
Eosinophils Absolute: 346 cells/uL (ref 15–500)
HCT: 42.8 % (ref 35.0–45.0)
Hemoglobin: 14.4 g/dL (ref 11.7–15.5)
Lymphs Abs: 2153 cells/uL (ref 850–3900)
MCH: 29.6 pg (ref 27.0–33.0)
MCHC: 33.6 g/dL (ref 32.0–36.0)
MCV: 88.1 fL (ref 80.0–100.0)
MPV: 9.8 fL (ref 7.5–12.5)
Monocytes Relative: 11.5 %
NEUTROS PCT: 52.5 %
Neutro Abs: 3780 cells/uL (ref 1500–7800)
PLATELETS: 263 10*3/uL (ref 140–400)
RBC: 4.86 10*6/uL (ref 3.80–5.10)
RDW: 12.1 % (ref 11.0–15.0)
TOTAL LYMPHOCYTE: 29.9 %
WBC mixed population: 828 cells/uL (ref 200–950)
WBC: 7.2 10*3/uL (ref 3.8–10.8)

## 2018-02-02 LAB — COMPLETE METABOLIC PANEL WITH GFR
AG Ratio: 1.4 (calc) (ref 1.0–2.5)
ALBUMIN MSPROF: 4.1 g/dL (ref 3.6–5.1)
ALT: 5 U/L — ABNORMAL LOW (ref 6–29)
AST: 11 U/L (ref 10–35)
Alkaline phosphatase (APISO): 53 U/L (ref 33–130)
BUN: 11 mg/dL (ref 7–25)
CO2: 26 mmol/L (ref 20–32)
CREATININE: 0.76 mg/dL (ref 0.60–0.88)
Calcium: 9.9 mg/dL (ref 8.6–10.4)
Chloride: 103 mmol/L (ref 98–110)
GFR, EST AFRICAN AMERICAN: 82 mL/min/{1.73_m2} (ref >=60)
GFR, EST NON AFRICAN AMERICAN: 71 mL/min/{1.73_m2} (ref >=60)
GLOBULIN: 3 g/dL (ref 1.9–3.7)
Glucose, Bld: 94 mg/dL (ref 65–99)
Potassium: 4.6 mmol/L (ref 3.5–5.3)
SODIUM: 139 mmol/L (ref 135–146)
Total Bilirubin: 0.9 mg/dL (ref 0.2–1.2)
Total Protein: 7.1 g/dL (ref 6.1–8.1)

## 2018-02-02 LAB — LIPID PANEL
CHOLESTEROL: 189 mg/dL (ref <200)
HDL: 84 mg/dL (ref >50)
LDL CHOLESTEROL (CALC): 86 mg/dL
Non-HDL Cholesterol (Calc): 105 mg/dL (calc) (ref <130)
Total CHOL/HDL Ratio: 2.3 (calc) (ref <5.0)
Triglycerides: 97 mg/dL (ref <150)

## 2018-02-02 LAB — TSH: TSH: 1.98 m[IU]/L (ref 0.40–4.50)

## 2018-02-07 ENCOUNTER — Other Ambulatory Visit: Payer: Self-pay | Admitting: Family Medicine

## 2018-02-07 DIAGNOSIS — I10 Essential (primary) hypertension: Secondary | ICD-10-CM

## 2018-02-07 NOTE — Assessment & Plan Note (Signed)
Son does not wish for her to see a neurologist at this time; continue medicine

## 2018-02-07 NOTE — Assessment & Plan Note (Signed)
Check TSH, since memory is continuing to decline, make sure adjustment in med not needed

## 2018-02-23 DIAGNOSIS — H40153 Residual stage of open-angle glaucoma, bilateral: Secondary | ICD-10-CM | POA: Diagnosis not present

## 2018-02-28 DIAGNOSIS — E785 Hyperlipidemia, unspecified: Secondary | ICD-10-CM | POA: Diagnosis not present

## 2018-02-28 DIAGNOSIS — I351 Nonrheumatic aortic (valve) insufficiency: Secondary | ICD-10-CM | POA: Diagnosis not present

## 2018-02-28 DIAGNOSIS — I10 Essential (primary) hypertension: Secondary | ICD-10-CM | POA: Diagnosis not present

## 2018-04-01 DIAGNOSIS — I351 Nonrheumatic aortic (valve) insufficiency: Secondary | ICD-10-CM | POA: Diagnosis not present

## 2018-04-18 ENCOUNTER — Other Ambulatory Visit: Payer: Self-pay | Admitting: Family Medicine

## 2018-04-18 DIAGNOSIS — E079 Disorder of thyroid, unspecified: Secondary | ICD-10-CM

## 2018-04-18 NOTE — Telephone Encounter (Signed)
Lab Results  Component Value Date   TSH 1.98 02/01/2018

## 2018-06-02 ENCOUNTER — Ambulatory Visit: Payer: Medicare Other | Admitting: Family Medicine

## 2018-07-04 ENCOUNTER — Ambulatory Visit: Payer: Medicare Other | Admitting: Family Medicine

## 2018-07-12 ENCOUNTER — Telehealth: Payer: Self-pay | Admitting: Nurse Practitioner

## 2018-07-12 DIAGNOSIS — E782 Mixed hyperlipidemia: Secondary | ICD-10-CM

## 2018-07-12 NOTE — Telephone Encounter (Signed)
Patient needs routine appointment  

## 2018-07-13 NOTE — Telephone Encounter (Signed)
Prescription has been sent to pharmacy [facility] asking pt to schedule appt. This can be a [facility] appt

## 2018-07-25 ENCOUNTER — Other Ambulatory Visit: Payer: Self-pay | Admitting: Family Medicine

## 2018-07-25 DIAGNOSIS — I10 Essential (primary) hypertension: Secondary | ICD-10-CM

## 2018-08-02 ENCOUNTER — Encounter: Payer: Self-pay | Admitting: Family Medicine

## 2018-08-02 ENCOUNTER — Other Ambulatory Visit: Payer: Self-pay

## 2018-08-02 ENCOUNTER — Ambulatory Visit (INDEPENDENT_AMBULATORY_CARE_PROVIDER_SITE_OTHER): Payer: Medicare Other | Admitting: Family Medicine

## 2018-08-02 DIAGNOSIS — I10 Essential (primary) hypertension: Secondary | ICD-10-CM

## 2018-08-02 DIAGNOSIS — F0280 Dementia in other diseases classified elsewhere without behavioral disturbance: Secondary | ICD-10-CM

## 2018-08-02 DIAGNOSIS — G301 Alzheimer's disease with late onset: Secondary | ICD-10-CM

## 2018-08-02 DIAGNOSIS — E079 Disorder of thyroid, unspecified: Secondary | ICD-10-CM | POA: Diagnosis not present

## 2018-08-02 DIAGNOSIS — E782 Mixed hyperlipidemia: Secondary | ICD-10-CM

## 2018-08-02 DIAGNOSIS — R739 Hyperglycemia, unspecified: Secondary | ICD-10-CM

## 2018-08-02 DIAGNOSIS — M8589 Other specified disorders of bone density and structure, multiple sites: Secondary | ICD-10-CM | POA: Diagnosis not present

## 2018-08-02 MED ORDER — ROSUVASTATIN CALCIUM 5 MG PO TABS: 5.0000 mg | ORAL_TABLET | Freq: Every day | ORAL | 5 refills | Status: DC

## 2018-08-02 MED ORDER — LEVOTHYROXINE SODIUM 75 MCG PO TABS: ORAL_TABLET | ORAL | 5 refills | Status: DC

## 2018-08-02 MED ORDER — MEMANTINE HCL 10 MG PO TABS: 10.0000 mg | ORAL_TABLET | Freq: Two times a day (BID) | ORAL | 5 refills | Status: DC

## 2018-08-02 NOTE — Progress Notes (Signed)
Name: [name]   MRN: [id number]    DOB: 1928/02/25   Date:08/02/2018       Progress Note  Subjective  Chief Complaint  Chief Complaint  Patient presents with  . Follow-up  . Medication Refill    I connected with  Deborah SchleinJeraldine N Benyo  on 08/02/18 at  2:20 PM EDT by a video enabled telemedicine application and verified that I am speaking with the correct person using two identifiers.  I discussed the limitations of evaluation and management by telemedicine and the availability of in person appointments. The patient expressed understanding and agreed to proceed. Staff also discussed with the patient that there may be a patient responsible charge related to this service. Patient Location: Home Provider Location: Home Additional Individuals present: Granddaughter, Lurena JoinerRebecca  HPI  Dementia: She says that her memory is not good, but she is still getting around.  She is having mostly short term memory loss.  She has support from her family - eats a small breakfast and dinner, does eat some snacks throughout the day.  She is taking namenda. She and her family are managing well at home, but they are having to remind her about her personal hygiene. Did advise of respite care, will refer to CCM.   HTN: She is taking metoprolol 25mg  1 tablet in the morning and 2 tablets at night.  She is doing well on this, though they are not able to check BP.  No BLE edema, chest pain, shortness of breath.  Hypothyroidism: She has been stable for about a year now on her current dose, and due to COVID-19 and her advanced age, we will keep her out of office even for labs.  No constipation, hair/skin/nail changes, no complaints of palpitaiton though she is on beta blocker.  HLD: Taking crestor no chest pain or shortness of breath, no myalgias, would like to stay on.   Osteopenia: She is taking vitamin D supplement is still ambulatory. She has never had any pathogenic fractures.   Hyperglycemia: Denies  polyphagia, polydipsia, or polyuria.   Patient Active Problem List   Diagnosis Date Noted  . Osteopenia 06/22/2017  . HTN (hypertension) 02/10/2017  . Dementia of the Alzheimer's type without behavioral disturbance (HCC) 11/04/2015  . Gradual-onset memory impairment 10/25/2015  . Thyroid disease 05/07/2015  . Hyperlipidemia 05/07/2015  . Hyperglycemia 05/07/2015    Past Surgical History:  Procedure Laterality Date  . NO PAST SURGERIES      Family History  Problem Relation Age of Onset  . Cancer Father   . Hyperlipidemia Sister   . Heart disease Brother   . Stroke Neg Hx     Social History   Socioeconomic History  . Marital status: Widowed    Spouse name: Not on file  . Number of children: 6  . Years of education: Not on file  . Highest education level: 10th grade  Occupational History    Employer: RETIRED  Social Needs  . Financial resource strain: Not hard at all  . Food insecurity:    Worry: Never true    Inability: Never true  . Transportation needs:    Medical: No    Non-medical: No  Tobacco Use  . Smoking status: Never Smoker  . Smokeless tobacco: Never Used  . Tobacco comment: smoking cessation materials not required  Substance and Sexual Activity  . Alcohol use: No    Alcohol/week: 0.0 standard drinks  . Drug use: No  . Sexual activity: Not Currently  Lifestyle  . Physical activity:    Days per week: 0 days    Minutes per session: 0 min  . Stress: Not at all  Relationships  . Social connections:    Talks on phone: Patient refused    Gets together: Patient refused    Attends religious service: Patient refused    Active member of club or organization: Patient refused    Attends meetings of clubs or organizations: Patient refused    Relationship status: Widowed  . Intimate partner violence:    Fear of current or ex partner: No    Emotionally abused: No    Physically abused: No    Forced sexual activity: No  Other Topics Concern  . Not on  file  Social History Narrative  . Not on file     Current Outpatient Medications:  .  bimatoprost (LUMIGAN) 0.01 % SOLN, Place 1 drop into both eyes at bedtime. , Disp: , Rfl:  .  brinzolamide (AZOPT) 1 % ophthalmic suspension, Place 1 drop into both eyes 2 (two) times daily. , Disp: , Rfl:  .  Calcium Carbonate-Vitamin D (CALCIUM HIGH POTENCY/VITAMIN D) 600-200 MG-UNIT TABS, Take by mouth., Disp: , Rfl:  .  levothyroxine (SYNTHROID, LEVOTHROID) 75 MCG tablet, TAKE 1 TABLET BY MOUTH DAILY BEFORE BREAKFAST, Disp: 90 tablet, Rfl: 2 .  memantine (NAMENDA) 10 MG tablet, Take 1 tablet (10 mg total) by mouth 2 (two) times daily., Disp: 60 tablet, Rfl: 5 .  metoprolol succinate (TOPROL-XL) 25 MG 24 hr tablet, TAKE 1 TABLET BY MOUTH IN THE MORNING AND 2 NIGHTLY, Disp: 270 tablet, Rfl: 0 .  Multiple Vitamins-Minerals (MULTIVITAMIN ADULT) TABS, Take by mouth., Disp: , Rfl:  .  rosuvastatin (CRESTOR) 5 MG tablet, TAKE 1 TABLET BY MOUTH AT BEDTIME, Disp: 90 tablet, Rfl: 0 .  VITAMIN D, ERGOCALCIFEROL, PO, Take 2,000 tablets by mouth daily. , Disp: , Rfl:   No Known Allergies  I personally reviewed active problem list, medication list, allergies with the patient/caregiver today.   ROS Constitutional: Negative for fever or weight change.  Respiratory: Negative for cough and shortness of breath.   Cardiovascular: Negative for chest pain or palpitations.  Gastrointestinal: Negative for abdominal pain, no bowel changes.  Musculoskeletal: Negative for gait problem or joint swelling.  Skin: Negative for rash.  Neurological: Negative for dizziness or headache. At baseline per granddaughter. No other specific complaints in a complete review of systems (except as listed in HPI above).  Objective  Virtual encounter, vitals not obtained.  There is no height or weight on file to calculate BMI.  Physical Exam Constitutional: Patient appears well-developed and well-nourished. No distress.  HENT: Head:  Normocephalic and atraumatic.  Neck: Normal range of motion. Pulmonary/Chest: Effort normal. No respiratory distress. Speaking in complete sentences Neurological: Pt is alert and oriented to person, place, and situation.  She is at her neurologic baseline per her granddaughter. Psychiatric: Patient has a normal mood and affect. behavior is normal. Judgment and thought content normal.  No results found for this or any previous visit (from the past 72 hour(s)).  PHQ2/9: Depression screen [facility] 2/9 08/02/2018 10/01/2017 06/22/2017 02/10/2017 08/26/2016  Decreased Interest 0 0 0 0 0  Down, Depressed, Hopeless 0 0 0 0 0  PHQ - 2 Score 0 0 0 0 0  Altered sleeping 0 0 - - -  Tired, decreased energy 0 0 - - -  Change in appetite 0 0 - - -  Feeling bad or failure about  yourself  0 0 - - -  Trouble concentrating 0 0 - - -  Moving slowly or fidgety/restless 0 0 - - -  Suicidal thoughts 0 0 - - -  PHQ-9 Score 0 0 - - -  Difficult doing work/chores Not difficult at all Not difficult at all - - -   PHQ-2/9 Result is negative.    Fall Risk: Fall Risk  08/02/2018 02/01/2018 10/01/2017 06/22/2017 02/10/2017  Falls in the past year? 0 0 No No No  Number falls in past yr: 0 0 - - -  Injury with Fall? 0 - - - -  Risk for fall due to : - - Impaired vision;Other (Comment);Mental status change - -  Risk for fall due to: Comment - - wears eyeglasses; tinnitus; glaucoma; dementia - -    Assessment & Plan  1. Late onset Alzheimer's disease without behavioral disturbance (HCC) - Ambulatory referral to Chronic Care Management Services - memantine (NAMENDA) 10 MG tablet; Take 1 tablet (10 mg total) by mouth 2 (two) times daily.  Dispense: 60 tablet; Refill: 5  2. Essential hypertension - Taking metoprolol, unable to check BP, no recent falls.  3. Thyroid disease - levothyroxine (SYNTHROID) 75 MCG tablet; TAKE 1 TABLET BY MOUTH DAILY BEFORE BREAKFAST  Dispense: 30 tablet; Refill: 5  4. Osteopenia of multiple  sites - Taking vitamin D supplement  5. Mixed hyperlipidemia - rosuvastatin (CRESTOR) 5 MG tablet; Take 1 tablet (5 mg total) by mouth at bedtime.  Dispense: 30 tablet; Refill: 5  6. Hyperglycemia - Asymptomatic, not checking BG's   I discussed the assessment and treatment plan with the patient. The patient was provided an opportunity to ask questions and all were answered. The patient agreed with the plan and demonstrated an understanding of the instructions.  The patient was advised to call back or seek an in-person evaluation if the symptoms worsen or if the condition fails to improve as anticipated.  I provided 25 minutes of non-face-to-face time during this encounter.

## 2018-08-03 ENCOUNTER — Ambulatory Visit: Payer: Self-pay

## 2018-08-03 DIAGNOSIS — I10 Essential (primary) hypertension: Secondary | ICD-10-CM

## 2018-08-03 DIAGNOSIS — F0280 Dementia in other diseases classified elsewhere without behavioral disturbance: Secondary | ICD-10-CM

## 2018-08-03 NOTE — Chronic Care Management (AMB) (Signed)
   Chronic Care Management   Unsuccessful Call Note 08/03/2018 Name: TATIYANA SHERE MRN: [id number] DOB: [birth year]  [name]. Larcom is a 85 year old female who sees Dr. Baruch Gouty for primary care. Maurice Small, FNP asked the CCM team to consult the patient for respite care resources secondary to late onset Alzheimer's and need for personal care assistance. Referral was placed 08/02/2018 during office visit with [name].    Was unable to reach patient/family today via telephone. I have left HIPAA compliant voicemail asking patient to return my call. (unsuccessful outreach #1).   Plan: Will follow-up within 7 business days via telephone.       Finley Chevez E. Suzie Portela, RN, BSN Nurse Care Coordinator [facility]  [phone]

## 2018-08-09 ENCOUNTER — Ambulatory Visit: Payer: Self-pay

## 2018-08-09 NOTE — Chronic Care Management (AMB) (Signed)
  Chronic Care Management   Note  08/09/2018 Name: [name] MRN: [id number] DOB: April 18, 1927  Care Coordination: CCM RN CM received an incoming message from Barbaraann Rondo, granddaughter, stating she was returning RN CM call. Message had been left previously requesting call back as patient was referred to [facility] for assistance with dementia care.  Unfortunately, RN CM explained to granddaughter that CM could not discuss any protected health information or reason for previous call as granddaughter was not on patients DPR. Granddaughter states she understood, and would have patients son or daughter return call.   Plan: Will await call from person on DPR (patient has living will in medical records but no HCPOA)    Enya Bureau E. Suzie Portela, RN, BSN Nurse Care Coordinator [facility] Care Management  [phone]

## 2018-08-10 ENCOUNTER — Ambulatory Visit: Payer: Self-pay

## 2018-08-10 NOTE — Chronic Care Management (AMB) (Signed)
   Chronic Care Management   Unsuccessful Call Note 08/03/2018 Name: [name]       MRN: [id number]       DOB: 07/28/1927  [name] is a 85 year old femalewho sees Dr. Baruch Gouty for primary care. Maurice Small, FNPasked the CCM team to consult the patient for respite care resources secondary to late onset Alzheimer's and need for personal care assistance. Referral was placed5/19/2020 during office visit with [name].   Was unable to reach [name] today via telephone. Ihave left HIPAA compliant voicemail asking patient to return my call. (unsuccessful outreach #2).  Plan: Will follow-up within 7business days via telephone.    [name] E. Suzie Portela, RN, BSN Nurse Care Coordinator [facility] Care Management  [phone]

## 2018-08-11 ENCOUNTER — Ambulatory Visit: Payer: Self-pay

## 2018-08-11 DIAGNOSIS — F0280 Dementia in other diseases classified elsewhere without behavioral disturbance: Secondary | ICD-10-CM

## 2018-08-11 DIAGNOSIS — G301 Alzheimer's disease with late onset: Secondary | ICD-10-CM

## 2018-08-11 DIAGNOSIS — R413 Other amnesia: Secondary | ICD-10-CM

## 2018-08-11 NOTE — Chronic Care Management (AMB) (Signed)
  Chronic Care Management   Note  08/11/2018 Name: [name] MRN: [id number] DOB: 12-17-1927  [name] is a 85 year old femalewho sees Dr. Enid Derry for primary care. Raelyn Ensign, FNPasked the CCM team to consult the patient for respite care resources secondary to late onset Alzheimer's and need for personal care assistance. Referral was placed5/19/2020 during office visit with [name]. Today CCM RN CM spoke with patients daughter [name] to introduce CCM Services.  [name] would appreciate engaging with [facility] Social Worker.   [name] was given information about Chronic Care Management services today including:  1. CCM service includes personalized support from designated clinical staff supervised by her physician, including individualized plan of care and coordination with other care providers 2. 24/7 contact phone numbers for assistance for urgent and routine care needs. 3. Service will only be billed when office clinical staff spend 20 minutes or more in a month to coordinate care. 4. Only one practitioner may furnish and bill the service in a calendar month. 5. The patient may stop CCM services at any time (effective at the end of the month) by phone call to the office staff. 6. The patient will be responsible for cost sharing (co-pay) of up to 20% of the service fee (after annual deductible is met).  Patient's daughter [name] agreed to services and verbal consent obtained.      Plan: Appointment scheduled with LCSW next week    Reynoldo Mainer E. Rollene Rotunda, RN, BSN Nurse Care Coordinator [facility] / [facility] Care Management  [phone]

## 2018-08-16 ENCOUNTER — Telehealth: Payer: Self-pay

## 2018-08-16 ENCOUNTER — Encounter: Payer: Self-pay | Admitting: *Deleted

## 2018-08-16 ENCOUNTER — Ambulatory Visit: Payer: Self-pay | Admitting: *Deleted

## 2018-08-16 DIAGNOSIS — F0280 Dementia in other diseases classified elsewhere without behavioral disturbance: Secondary | ICD-10-CM

## 2018-08-16 DIAGNOSIS — G309 Alzheimer's disease, unspecified: Secondary | ICD-10-CM

## 2018-08-16 DIAGNOSIS — I10 Essential (primary) hypertension: Secondary | ICD-10-CM

## 2018-08-16 NOTE — Progress Notes (Signed)
This encounter was created in error - please disregard.

## 2018-08-16 NOTE — Chronic Care Management (AMB) (Signed)
   Chronic Care Management   Unsuccessful Call Note 08/16/2018 Name: [name] MRN: [id number] DOB: 05/03/1927  [name]. Griffinis a 85year old femalewho seesDr. Vickey Huger primary care. Maurice Small, FNPasked the CCM team to consult the patient forrespite care resources secondary to late onset Alzheimer's and need for personal care assistance.Referral was placed5/19/2020 during office visit with [name]. The CCM RN CM spoke with patients daughter [name] to introduce CCM Services and obtain consent.   This social worker was unable to reach patient's daughter via telephone today to provide the community resources requested. I have left HIPAA compliant voicemail asking patient to return my call. (unsuccessful outreach #1).   Plan: Will follow-up within 7 business days via telephone.     Verna Czech, LCSW Clinical [facility] Care Management [phone]

## 2018-08-17 ENCOUNTER — Ambulatory Visit: Payer: Self-pay | Admitting: *Deleted

## 2018-08-17 ENCOUNTER — Encounter: Payer: Self-pay | Admitting: *Deleted

## 2018-08-17 NOTE — Progress Notes (Signed)
This encounter was created in error - please disregard.

## 2018-08-17 NOTE — Chronic Care Management (AMB) (Signed)
  Chronic Care Management   Social Work Note  08/17/2018 Name: [name] MRN: [id number] DOB: 23-Aug-1927  [name] is a 85 y.o. year old female who sees Lada, Janit Bern, MD for primary care. The CCM team was consulted for assistance with [employer].   Return phone call from patient's daughter [name]. Initial appointment scheduled for 08/18/2018 at 8:30am to provide her with resources needed to care for patient in the home.  Goals Addressed   None     Follow Up Plan: SW will follow up with patient's daughter by phone on 08/18/18 at 8:30am  Verna Czech, LCSW Clinical Social Worker  [facility] Care Management [phone]

## 2018-08-18 ENCOUNTER — Ambulatory Visit (INDEPENDENT_AMBULATORY_CARE_PROVIDER_SITE_OTHER): Payer: Medicare Other | Admitting: *Deleted

## 2018-08-18 DIAGNOSIS — I10 Essential (primary) hypertension: Secondary | ICD-10-CM | POA: Diagnosis not present

## 2018-08-18 DIAGNOSIS — F0280 Dementia in other diseases classified elsewhere without behavioral disturbance: Secondary | ICD-10-CM | POA: Diagnosis not present

## 2018-08-18 DIAGNOSIS — G309 Alzheimer's disease, unspecified: Secondary | ICD-10-CM

## 2018-08-18 NOTE — Patient Instructions (Signed)
Thank you allowing the Chronic Care Management Team to be a part of your care! It was a pleasure speaking with you today!  1. Please review the community resources emailed to assist with patient's care 2. Please call this social worker with any questions or concerns in the future.       CCM (Chronic Care Management) Team   Yvone Neu RN, BSN Nurse Care Coordinator  [phone]  Karalee Height PharmD  Clinical Pharmacist  [phone]   Verna Czech, LCSW Clinical Social Worker [phone]  Goals Addressed            This Visit's Progress   . "I would like some resources to help take care of my mom" (pt-stated)       Current Barriers:  . ADL IADL limitations . Cognitive Deficits . Inability to perform ADL's independently . Inability to perform IADL's independently  Clinical Social Work Clinical Goal(s):  Marland Kitchen Over the next 30 days, client's daughter will follow up with community resources provided as directed by SW as needed   Interventions: . Provided patient's daughter with information about available community resources for day programs, in home care and educational resources for dementia care and support . Provided patient's daughter with encouragement and support related to care practices already put in place for patient.   Patient Self Care Activities:  . Currently UNABLE TO independently perform ADL's and IADL's  Initial goal documentation         The patient's daughter verbalized understanding of instructions provided today and declined a print copy of patient instruction materials.   No further follow up required: patient's daughter to call this social worker with any questions or concerns in the future if needed

## 2018-08-18 NOTE — Chronic Care Management (AMB) (Signed)
.  ccm  Chronic Care Management    Clinical Social Work General Follow Up Note  08/18/2018 Name: [name] MRN: [id number] DOB: 1927-04-07  MACALA PROKOSCH is a 85 y.o. year old female who is a primary care patient of Lada, Janit Bern, MD. The CCM team was consulted for assistance with Community Resources related to in home care and educational support.   Review of patient status, including review of consultants reports, relevant laboratory and other test results, and collaboration with appropriate care team members and the patient's provider was performed as part of comprehensive patient evaluation and provision of chronic care management services.    Goals Addressed            This Visit's Progress   . "I would like some resources to help take care of my mom" (pt-stated)       Phone call to patient's daughter today by phone. Per patient's daughter, patient's condition is progressing as it has become more difficult for patient to complete her ADL's and IADL's. Patient's daughter verbalized that patient is often easily agitated and confused. She requested recommendations on how to best manage her care. Patient's son and granddaughter reside with her and assist with her care, however are now experiencing fatigue. Patient's daughter provided this social worker provided the following e-mail address to e-mail Dementia related resources [email or web address]   Current Barriers:  . ADL IADL limitations . Cognitive Deficits . Inability to perform ADL's independently . Inability to perform IADL's independently  Clinical Social Work Clinical Goal(s):  Marland Kitchen Over the next 30 days, client's daughter will follow up with community resources provided as directed by SW as needed   Interventions: . Provided patient's daughter with information about available community resources for day programs, in home care and educational resources for dementia care and support . Provided patient's daughter with  encouragement and support related to care practices already put in place for patient. .    Patient Self Care Activities:  . Currently UNABLE TO independently perform ADL's and IADL's  Initial goal documentation          Follow Up Plan: This Child psychotherapist will email dementia related resources to patient's daughter to utilize as needed    [employer], LCSW Clinical Social Worker  [facility] Care Management [phone]

## 2018-08-24 DIAGNOSIS — I10 Essential (primary) hypertension: Secondary | ICD-10-CM | POA: Diagnosis not present

## 2018-08-30 ENCOUNTER — Telehealth: Payer: Self-pay

## 2018-08-31 ENCOUNTER — Telehealth: Payer: Self-pay

## 2018-09-20 DIAGNOSIS — H40153 Residual stage of open-angle glaucoma, bilateral: Secondary | ICD-10-CM | POA: Diagnosis not present

## 2018-10-04 ENCOUNTER — Ambulatory Visit: Payer: Medicare Other

## 2018-10-21 ENCOUNTER — Ambulatory Visit (INDEPENDENT_AMBULATORY_CARE_PROVIDER_SITE_OTHER): Payer: Medicare Other

## 2018-10-21 DIAGNOSIS — Z0000 Encounter for general adult medical examination without abnormal findings: Secondary | ICD-10-CM

## 2018-10-21 NOTE — Progress Notes (Signed)
Subjective:   [name] is a 85 y.o. female who presents for Medicare Annual (Subsequent) preventive examination.  Virtual Visit via Telephone Note  I connected with [name] on 10/21/18 at  3:30 PM EDT by telephone and verified that I am speaking with the correct person using two identifiers.  Medicare Annual Wellness visit completed telephonically due to Covid-19 pandemic.   Location: Patient: home Provider: office   I discussed the limitations, risks, security and privacy concerns of performing an evaluation and management service by telephone and the availability of in person appointments. The patient expressed understanding and agreed to proceed.  Some vital signs may be absent or patient reported.   Reather LittlerKasey Manu Rubey, LPN   Review of Systems:   Cardiac Risk Factors include: advanced age (>4455men, 60>65 women);dyslipidemia;hypertension;sedentary lifestyle     Objective:     Vitals: There were no vitals taken for this visit.  There is no height or weight on file to calculate BMI.  Advanced Directives 10/01/2017 08/26/2016 07/27/2016 06/29/2016 04/29/2016 12/09/2015 11/04/2015  Does Patient Have a Medical Advance Directive? Yes Yes Yes No No Yes Yes  Type of Estate agentAdvance Directive Healthcare Power of [place]irieAttorney;Living will Healthcare Power of [place]orney;Living will Healthcare Power of [place]orney;Living will - - Healthcare Power of [place]orney;Living will Healthcare Power of [place]Attorney;Living will  Does patient want to make changes to medical advance directive? - - - - - No - Patient declined -  Copy of Healthcare Power of Attorney in Chart? Yes No - copy requested No - copy requested - - No - copy requested No - copy requested    Tobacco Social History   Tobacco Use  Smoking Status Never Smoker  Smokeless Tobacco Never Used  Tobacco Comment   smoking cessation materials not required     Counseling given: Not Answered Comment: smoking cessation materials not required    Clinical Intake:  Pre-visit preparation completed: Yes  Pain : No/denies pain     Nutritional Risks: None Diabetes: No  How often do you need to have someone help you when you read instructions, pamphlets, or other written materials from your doctor or pharmacy?: 1 - Never  Interpreter Needed?: No  Information entered by :: Reather LittlerKasey Lorean Ekstrand LPN  Past Medical History:  Diagnosis Date  . Dementia (HCC)   . Glaucoma   . HTN (hypertension) 02/10/2017  . Osteoporosis   . Thyroid disease    Past Surgical History:  Procedure Laterality Date  . NO PAST SURGERIES     Family History  Problem Relation Age of Onset  . Cancer Father   . Hyperlipidemia Sister   . Heart disease Brother   . Stroke Neg Hx    Social History   Socioeconomic History  . Marital status: Widowed    Spouse name: Not on file  . Number of children: 6  . Years of education: Not on file  . Highest education level: 10th grade  Occupational History    Employer: RETIRED  Social Needs  . Financial resource strain: Not hard at all  . Food insecurity    Worry: Never true    Inability: Never true  . Transportation needs    Medical: No    Non-medical: No  Tobacco Use  . Smoking status: Never Smoker  . Smokeless tobacco: Never Used  . Tobacco comment: smoking cessation materials not required  Substance and Sexual Activity  . Alcohol use: No    Alcohol/week: 0.0 standard drinks  . Drug use: No  .  Sexual activity: Not Currently  Lifestyle  . Physical activity    Days per week: 0 days    Minutes per session: 0 min  . Stress: Not at all  Relationships  . Social Herbalist on phone: Patient refused    Gets together: Patient refused    Attends religious service: Patient refused    Active member of club or organization: Patient refused    Attends meetings of clubs or organizations: Patient refused    Relationship status: Widowed  Other Topics Concern  . Not on file  Social History Narrative   . Not on file    Outpatient Encounter Medications as of 10/21/2018  Medication Sig  . bimatoprost (LUMIGAN) 0.01 % SOLN Place 1 drop into both eyes at bedtime.   . brinzolamide (AZOPT) 1 % ophthalmic suspension Place 1 drop into both eyes 2 (two) times daily.   . Calcium Carbonate-Vitamin D (CALCIUM HIGH POTENCY/VITAMIN D) 600-200 MG-UNIT TABS Take by mouth.  . furosemide (LASIX) 20 MG tablet Take 20 mg by mouth daily.  Marland Kitchen levothyroxine (SYNTHROID) 75 MCG tablet TAKE 1 TABLET BY MOUTH DAILY BEFORE BREAKFAST  . memantine (NAMENDA) 10 MG tablet Take 1 tablet (10 mg total) by mouth 2 (two) times daily.  . metoprolol succinate (TOPROL-XL) 25 MG 24 hr tablet TAKE 1 TABLET BY MOUTH IN THE MORNING AND 2 NIGHTLY  . Multiple Vitamins-Minerals (MULTIVITAMIN ADULT) TABS Take by mouth.  . rosuvastatin (CRESTOR) 5 MG tablet Take 1 tablet (5 mg total) by mouth at bedtime.  Marland Kitchen VITAMIN D, ERGOCALCIFEROL, PO Take 2,000 tablets by mouth daily.    No facility-administered encounter medications on file as of 10/21/2018.     Activities of Daily Living In your present state of health, do you have any difficulty performing the following activities: 10/21/2018 08/02/2018  Hearing? N Y  Comment declines hearing aids -  Vision? N Y  Difficulty concentrating or making decisions? Y N  Walking or climbing stairs? N N  Dressing or bathing? Y N  Comment requires some assistance -  Doing errands, shopping? Y N  Preparing Food and eating ? Y -  Using the Toilet? N -  In the past six months, have you accidently leaked urine? N -  Do you have problems with loss of bowel control? N -  Managing your Medications? Y -  Managing your Finances? Y -  Housekeeping or managing your Housekeeping? Y -  Some recent data might be hidden    Patient Care Team: Lada, Satira Anis, MD as PCP - General (Family Medicine) Lorelee Cover., MD as Consulting Physician (Ophthalmology) Isaias Cowman, MD as Consulting Physician  (Cardiology) Vern Claude, [place] as Social Worker    Assessment:   This is a routine wellness examination for [name].  Exercise Activities and Dietary recommendations Current Exercise Habits: The patient does not participate in regular exercise at present, Exercise limited by: psychological condition(s);orthopedic condition(s)  Goals    . "I would like some resources to help take care of my mom" (pt-stated)     Current Barriers:  . ADL IADL limitations . Cognitive Deficits . Inability to perform ADL's independently . Inability to perform IADL's independently  Clinical Social Work Clinical Goal(s):  Marland Kitchen Over the next 30 days, client's daughter will follow up with community resources provided as directed by SW as needed   Interventions: . Provided patient's daughter with information about available community resources for day programs, in home care and educational resources  for dementia care and support . Provided patient's daughter with encouragement and support related to care practices already put in place for patient.   Patient Self Care Activities:  . Currently UNABLE TO independently perform ADL's and IADL's  Initial goal documentation     . DIET - INCREASE WATER INTAKE     Recommend to drink at least 6-8 8oz glasses of water per day.    . Increase water intake     Recommend increasing water intake to 4-6 glasses a day.    . Prevent falls     Recommend to remove any items from the home that may cause slips or trips.       Fall Risk Fall Risk  10/21/2018 08/02/2018 02/01/2018 10/01/2017 06/22/2017  Falls in the past year? 0 0 0 No No  Number falls in past yr: 0 0 0 - -  Injury with Fall? 0 0 - - -  Risk for fall due to : - - - Impaired vision;Other (Comment);Mental status change -  Risk for fall due to: Comment - - - wears eyeglasses; tinnitus; glaucoma; dementia -  Follow up Falls prevention discussed - - - -   FALL RISK PREVENTION PERTAINING TO THE HOME:  Any  stairs in or around the home? Yes  If so, do they handrails? Yes   Home free of loose throw rugs in walkways, pet beds, electrical cords, etc? Yes  Adequate lighting in your home to reduce risk of falls? Yes   ASSISTIVE DEVICES UTILIZED TO PREVENT FALLS:  Life alert? Yes  Use of a cane, walker or w/c? Yes - occasional use of cane Grab bars in the bathroom? Yes Shower chair or bench in shower? No  Elevated toilet seat or a handicapped toilet? Yes   DME ORDERS:  DME order needed?  No   TIMED UP AND GO:  Was the test performed? No . Telephonic visit.   Education: Fall risk prevention has been discussed.  Intervention(s) required? No   Depression Screen PHQ 2/9 Scores 10/21/2018 08/02/2018 10/01/2017 06/22/2017  PHQ - 2 Score 0 0 0 0  PHQ- 9 Score - 0 0 -     Cognitive Function - 6CIT declined at this time     6CIT Screen 02/01/2018 10/01/2017 06/22/2017 07/27/2016  What Year? 4 points 4 points 4 points 4 points  What month? 3 points 3 points 0 points 3 points  What time? 0 points 0 points 0 points 0 points  Count back from 20 4 points 4 points 4 points 0 points  Months in reverse 4 points 4 points 4 points 4 points  Repeat phrase 10 points 10 points 10 points 8 points  Total Score 25 25 22 19     Immunization History  Administered Date(s) Administered  . Influenza, High Dose Seasonal PF 12/28/2015, 12/26/2016, 01/05/2018  . Influenza-Unspecified 12/26/2016, 01/05/2018  . Pneumococcal Conjugate-13 06/22/2017    Qualifies for Shingles Vaccine? Yes  . Due for Shingrix. Education has been provided regarding the importance of this vaccine. Pt has been advised to call insurance company to determine out of pocket expense. Advised may also receive vaccine at local pharmacy or Health Dept. Verbalized acceptance and understanding.  Tdap: Although this vaccine is not a covered service during a Wellness Exam, does the patient still wish to receive this vaccine today?  No .  Education has  been provided regarding the importance of this vaccine. Advised may receive this vaccine at local pharmacy or Health Dept. Aware  to provide a copy of the vaccination record if obtained from local pharmacy or Health Dept. Verbalized acceptance and understanding.  Flu Vaccine: Up to date  Pneumococcal Vaccine: Due for Pneumococcal vaccine. Does the patient want to receive this vaccine today?  No . Education has been provided regarding the importance of this vaccine but still declined. Advised may receive this vaccine at local pharmacy or Health Dept. Aware to provide a copy of the vaccination record if obtained from local pharmacy or Health Dept. Verbalized acceptance and understanding.   Screening Tests Health Maintenance  Topic Date Due  . PNA vac Low Risk Adult (2 of 2 - PPSV23) 06/23/2018  . INFLUENZA VACCINE  10/15/2018  . TETANUS/TDAP  03/16/2026 (Originally 02/24/1947)  . DEXA SCAN  Completed   Cancer Screenings:  Colorectal Screening: No longer required.   Mammogram: No longer required.  Bone Density: Completed 08/31/16. Results reflect  OSTEOPENIA. Repeat every 2 years.    Lung Cancer Screening: (Low Dose CT Chest recommended if Age 101-80 years, 30 pack-year currently smoking OR have quit w/in 15years.) does not qualify.    Additional Screening:  Hepatitis C Screening: no longer required  Vision Screening: Recommended annual ophthalmology exams for early detection of glaucoma and other disorders of the eye. Is the patient up to date with their annual eye exam?  Yes  Who is the provider or what is the name of the office in which the pt attends annual eye exams? Dr. Hulen LusterPhillip Bell  Dental Screening: Recommended annual dental exams for proper oral hygiene  Community Resource Referral:  CRR required this visit?  No      Plan:     I have personally reviewed and addressed the Medicare Annual Wellness questionnaire and have noted the following in the patient's chart:  A.  Medical and social history B. Use of alcohol, tobacco or illicit drugs  C. Current medications and supplements D. Functional ability and status E.  Nutritional status F.  Physical activity G. Advance directives H. List of other physicians I.  Hospitalizations, surgeries, and ER visits in previous 12 months J.  Vitals K. Screenings such as hearing and vision if needed, cognitive and depression L. Referrals and appointments   In addition, I have reviewed and discussed with patient certain preventive protocols, quality metrics, and best practice recommendations. A written personalized care plan for preventive services as well as general preventive health recommendations were provided to patient.   Signed,  Reather LittlerKasey Toris Laverdiere, LPN Nurse Health Advisor   Nurse Notes: pt doing well, accompanied during telephone visit by her son. Advised to schedule follow up appt by Nov 2020.

## 2018-10-21 NOTE — Patient Instructions (Signed)
[name] , Thank you for taking time to come for your Medicare Wellness Visit. I appreciate your ongoing commitment to your health goals. Please review the following plan we discussed and let me know if I can assist you in the future.   Screening recommendations/referrals: Colonoscopy: no longer required Mammogram: no longer required Bone Density: no longer required Recommended yearly ophthalmology/optometry visit for glaucoma screening and checkup Recommended yearly dental visit for hygiene and checkup  Vaccinations: Influenza vaccine: done 01/05/18 Pneumococcal vaccine: done 06/22/17. Due for second dose.  Tdap vaccine: due - please contact us if you get a cut or scrape Shingles vaccine: Shingrix discussed. Please contact your pharmacy for coverage information.   Conditions/risks identified: recommend drinking 6-8 glasses of water per day  Next appointment: Please follow up in one year for your Medicare Annual Wellness visit.     Preventive Care 18 Years and Older, Female Preventive care refers to lifestyle choices and visits with your health care provider that can promote health and wellness. What does preventive care include?  A yearly physical exam. This is also called an annual well check.  Dental exams once or twice a year.  Routine eye exams. Ask your health care provider how often you should have your eyes checked.  Personal lifestyle choices, including:  Daily care of your teeth and gums.  Regular physical activity.  Eating a healthy diet.  Avoiding tobacco and drug use.  Limiting alcohol use.  Practicing safe sex.  Taking low-dose aspirin every day.  Taking vitamin and mineral supplements as recommended by your health care provider. What happens during an annual well check? The services and screenings done by your health care provider during your annual well check will depend on your age, overall health, lifestyle risk factors, and family history of  disease. Counseling  Your health care provider may ask you questions about your:  Alcohol use.  Tobacco use.  Drug use.  Emotional well-being.  Home and relationship well-being.  Sexual activity.  Eating habits.  History of falls.  Memory and ability to understand (cognition).  Work and work Statistician.  Reproductive health. Screening  You may have the following tests or measurements:  Height, weight, and BMI.  Blood pressure.  Lipid and cholesterol levels. These may be checked every 5 years, or more frequently if you are over 44 years old.  Skin check.  Lung cancer screening. You may have this screening every year starting at age 53 if you have a 30-pack-year history of smoking and currently smoke or have quit within the past 15 years.  Fecal occult blood test (FOBT) of the stool. You may have this test every year starting at age 23.  Flexible sigmoidoscopy or colonoscopy. You may have a sigmoidoscopy every 5 years or a colonoscopy every 10 years starting at age 67.  Hepatitis C blood test.  Hepatitis B blood test.  Sexually transmitted disease (STD) testing.  Diabetes screening. This is done by checking your blood sugar (glucose) after you have not eaten for a while (fasting). You may have this done every 1-3 years.  Bone density scan. This is done to screen for osteoporosis. You may have this done starting at age 16.  Mammogram. This may be done every 1-2 years. Talk to your health care provider about how often you should have regular mammograms. Talk with your health care provider about your test results, treatment options, and if necessary, the need for more tests. Vaccines  Your health care provider may recommend  certain vaccines, such as:  Influenza vaccine. This is recommended every year.  Tetanus, diphtheria, and acellular pertussis (Tdap, Td) vaccine. You may need a Td booster every 10 years.  Zoster vaccine. You may need this after age 1.   Pneumococcal 13-valent conjugate (PCV13) vaccine. One dose is recommended after age 33.  Pneumococcal polysaccharide (PPSV23) vaccine. One dose is recommended after age 47. Talk to your health care provider about which screenings and vaccines you need and how often you need them. This information is not intended to replace advice given to you by your health care provider. Make sure you discuss any questions you have with your health care provider. Document Released: 03/29/2015 Document Revised: 11/20/2015 Document Reviewed: 01/01/2015 Elsevier Interactive Patient Education  2017 [place] Prevention in the Home Falls can cause injuries. They can happen to people of all ages. There are many things you can do to make your home safe and to help prevent falls. What can I do on the outside of my home?  Regularly fix the edges of walkways and driveways and fix any cracks.  Remove anything that might make you trip as you walk through a door, such as a raised step or threshold.  Trim any bushes or trees on the path to your home.  Use bright outdoor lighting.  Clear any walking paths of anything that might make someone trip, such as rocks or tools.  Regularly check to see if handrails are loose or broken. Make sure that both sides of any steps have handrails.  Any raised decks and porches should have guardrails on the edges.  Have any leaves, snow, or ice cleared regularly.  Use sand or salt on walking paths during winter.  Clean up any spills in your garage right away. This includes oil or grease spills. What can I do in the bathroom?  Use night lights.  Install grab bars by the toilet and in the tub and shower. Do not use towel bars as grab bars.  Use non-skid mats or decals in the tub or shower.  If you need to sit down in the shower, use a plastic, non-slip stool.  Keep the floor dry. Clean up any water that spills on the floor as soon as it happens.  Remove soap  buildup in the tub or shower regularly.  Attach bath mats securely with double-sided non-slip rug tape.  Do not have throw rugs and other things on the floor that can make you trip. What can I do in the bedroom?  Use night lights.  Make sure that you have a light by your bed that is easy to reach.  Do not use any sheets or blankets that are too big for your bed. They should not hang down onto the floor.  Have a firm chair that has side arms. You can use this for support while you get dressed.  Do not have throw rugs and other things on the floor that can make you trip. What can I do in the kitchen?  Clean up any spills right away.  Avoid walking on wet floors.  Keep items that you use a lot in easy-to-reach places.  If you need to reach something above you, use a strong step stool that has a grab bar.  Keep electrical cords out of the way.  Do not use floor polish or wax that makes floors slippery. If you must use wax, use non-skid floor wax.  Do not have throw rugs and other  things on the floor that can make you trip. What can I do with my stairs?  Do not leave any items on the stairs.  Make sure that there are handrails on both sides of the stairs and use them. Fix handrails that are broken or loose. Make sure that handrails are as long as the stairways.  Check any carpeting to make sure that it is firmly attached to the stairs. Fix any carpet that is loose or worn.  Avoid having throw rugs at the top or bottom of the stairs. If you do have throw rugs, attach them to the floor with carpet tape.  Make sure that you have a light switch at the top of the stairs and the bottom of the stairs. If you do not have them, ask someone to add them for you. What else can I do to help prevent falls?  Wear shoes that:  Do not have high heels.  Have rubber bottoms.  Are comfortable and fit you well.  Are closed at the toe. Do not wear sandals.  If you use a stepladder:  Make  sure that it is fully opened. Do not climb a closed stepladder.  Make sure that both sides of the stepladder are locked into place.  Ask someone to hold it for you, if possible.  Clearly mark and make sure that you can see:  Any grab bars or handrails.  First and last steps.  Where the edge of each step is.  Use tools that help you move around (mobility aids) if they are needed. These include:  Canes.  Walkers.  Scooters.  Crutches.  Turn on the lights when you go into a dark area. Replace any light bulbs as soon as they burn out.  Set up your furniture so you have a clear path. Avoid moving your furniture around.  If any of your floors are uneven, fix them.  If there are any pets around you, be aware of where they are.  Review your medicines with your doctor. Some medicines can make you feel dizzy. This can increase your chance of falling. Ask your doctor what other things that you can do to help prevent falls. This information is not intended to replace advice given to you by your health care provider. Make sure you discuss any questions you have with your health care provider. Document Released: 12/27/2008 Document Revised: 08/08/2015 Document Reviewed: 04/06/2014 Elsevier Interactive Patient Education  2017 [employer].

## 2018-10-31 ENCOUNTER — Other Ambulatory Visit: Payer: Self-pay | Admitting: Nurse Practitioner

## 2018-10-31 DIAGNOSIS — I10 Essential (primary) hypertension: Secondary | ICD-10-CM

## 2018-10-31 NOTE — Telephone Encounter (Signed)
Schedule routine appointment in 6 months

## 2019-02-26 ENCOUNTER — Encounter: Payer: Self-pay | Admitting: Emergency Medicine

## 2019-02-26 ENCOUNTER — Emergency Department
Admission: EM | Admit: 2019-02-26 | Discharge: 2019-02-26 | Disposition: A | Discharge status: Home / Self Care | Payer: Medicare Other | Attending: Emergency Medicine | Admitting: Emergency Medicine

## 2019-02-26 ENCOUNTER — Other Ambulatory Visit: Payer: Self-pay

## 2019-02-26 DIAGNOSIS — Z5321 Procedure and treatment not carried out due to patient leaving prior to being seen by health care provider: Secondary | ICD-10-CM | POA: Insufficient documentation

## 2019-02-26 DIAGNOSIS — R42 Dizziness and giddiness: Secondary | ICD-10-CM | POA: Insufficient documentation

## 2019-02-26 LAB — COMPREHENSIVE METABOLIC PANEL
ALT: 10 U/L (ref 0–44)
AST: 15 U/L (ref 15–41)
Albumin: 3.8 g/dL (ref 3.5–5.0)
Alkaline Phosphatase: 50 U/L (ref 38–126)
Anion gap: 10 (ref 5–15)
BUN: 12 mg/dL (ref 8–23)
CO2: 27 mmol/L (ref 22–32)
Calcium: 9.3 mg/dL (ref 8.9–10.3)
Chloride: 103 mmol/L (ref 98–111)
Creatinine, Ser: 0.78 mg/dL (ref 0.44–1.00)
GFR calc Af Amer: >60 mL/min (ref >60)
GFR calc non Af Amer: >60 mL/min (ref >60)
Glucose, Bld: 119 mg/dL — ABNORMAL HIGH (ref 70–99)
Potassium: 3.6 mmol/L (ref 3.5–5.1)
Sodium: 140 mmol/L (ref 135–145)
Total Bilirubin: 1 mg/dL (ref 0.3–1.2)
Total Protein: 7.7 g/dL (ref 6.5–8.1)

## 2019-02-26 LAB — CBC
HCT: 43.8 % (ref 36.0–46.0)
Hemoglobin: 14.1 g/dL (ref 12.0–15.0)
MCH: 29.9 pg (ref 26.0–34.0)
MCHC: 32.2 g/dL (ref 30.0–36.0)
MCV: 93 fL (ref 80.0–100.0)
Platelets: 224 10*3/uL (ref 150–400)
RBC: 4.71 MIL/uL (ref 3.87–5.11)
RDW: 12.6 % (ref 11.5–15.5)
WBC: 10.3 10*3/uL (ref 4.0–10.5)
nRBC: 0 % (ref 0.0–0.2)

## 2019-02-26 LAB — LIPASE, BLOOD: Lipase: 20 U/L (ref 11–51)

## 2019-02-26 LAB — TROPONIN I (HIGH SENSITIVITY): Troponin I (High Sensitivity): 10 ng/L (ref <18)

## 2019-02-26 MED ORDER — SODIUM CHLORIDE 0.9% FLUSH: 3.0000 mL | Freq: Once | INTRAVENOUS | Status: DC

## 2019-02-26 NOTE — ED Triage Notes (Signed)
C/O vomiting, lightheadedness, low grade fever today.  Temp at home 96.7.

## 2019-02-26 NOTE — ED Notes (Signed)
Rainbow sent to lab

## 2019-02-27 ENCOUNTER — Telehealth: Payer: Self-pay | Admitting: Emergency Medicine

## 2019-02-27 NOTE — Telephone Encounter (Signed)
Called patient due to lwot to inquire about condition and follow up plans. Left message.   

## 2019-03-26 ENCOUNTER — Other Ambulatory Visit: Payer: Self-pay | Admitting: Family Medicine

## 2019-03-26 DIAGNOSIS — E782 Mixed hyperlipidemia: Secondary | ICD-10-CM

## 2019-03-27 NOTE — Telephone Encounter (Signed)
Requested Prescriptions  Pending Prescriptions Disp Refills  . rosuvastatin (CRESTOR) 5 MG tablet [Pharmacy Med Name: Rosuvastatin Calcium 5 MG Oral Tablet] 90 tablet 0    Sig: TAKE 1 TABLET BY MOUTH AT BEDTIME     Cardiovascular:  Antilipid - Statins Failed - 03/26/2019  4:21 PM      Failed - Total Cholesterol in normal range and within 360 days    Cholesterol, Total  Date Value Ref Range Status  05/07/2015 279 (H) 100 - 199 mg/dL Final   Cholesterol  Date Value Ref Range Status  02/01/2018 189 <200 mg/dL Final         Failed - LDL in normal range and within 360 days    LDL Cholesterol (Calc)  Date Value Ref Range Status  02/01/2018 86 mg/dL (calc) Final    Comment:    Reference range: <100 . Desirable range <100 mg/dL for primary prevention;   <70 mg/dL for patients with CHD or diabetic patients  with > or = 2 CHD risk factors. Marland Kitchen LDL-C is now calculated using the Martin-Hopkins  calculation, which is a validated novel method providing  better accuracy than the Friedewald equation in the  estimation of LDL-C.  Horald Pollen et al. Lenox Ahr. 1610;960(45): 2061-2068  (http://education.[facility].com/faq/FAQ164)          Failed - HDL in normal range and within 360 days    HDL  Date Value Ref Range Status  02/01/2018 84 >50 mg/dL Final  40/98/1191 77 >47 mg/dL Final         Failed - Triglycerides in normal range and within 360 days    Triglycerides  Date Value Ref Range Status  02/01/2018 97 <150 mg/dL Final         Passed - Patient is not pregnant      Passed - Valid encounter within last 12 months    Recent Outpatient Visits          7 months ago Late onset Alzheimer's disease without behavioral disturbance [facility])   [facility] [place], Gerome Apley, FNP   1 year ago Essential hypertension   [facility], Janit Bern, MD   1 year ago Late onset Alzheimer's disease without behavioral disturbance   [facility]  Lada, Janit Bern, MD   1 year ago Essential hypertension   [facility] Lada, Janit Bern, MD   2 years ago Thyroid disease   [facility] Ellyn Hack, MD

## 2019-04-30 ENCOUNTER — Other Ambulatory Visit: Payer: Self-pay | Admitting: Family Medicine

## 2019-04-30 DIAGNOSIS — G301 Alzheimer's disease with late onset: Secondary | ICD-10-CM

## 2019-04-30 DIAGNOSIS — F0280 Dementia in other diseases classified elsewhere without behavioral disturbance: Secondary | ICD-10-CM

## 2019-04-30 NOTE — Telephone Encounter (Signed)
Requested Prescriptions  Pending Prescriptions Disp Refills  . memantine (NAMENDA) 10 MG tablet [Pharmacy Med Name: Memantine HCl 10 MG Oral Tablet] 180 tablet 0    Sig: Take 1 tablet by mouth twice daily     Neurology:  Alzheimer's Agents Failed - 04/30/2019 10:23 AM      Failed - Valid encounter within last 6 months    Recent Outpatient Visits          9 months ago Late onset Alzheimer's disease without behavioral disturbance [facility])   [facility] [place], Gerome Apley, FNP   1 year ago Essential hypertension   [facility] L[facility], Janit Bern, MD   1 year ago Late onset Alzheimer's disease without behavioral disturbance   [facility] L[facility], Janit Bern, MD   1 year ago Essential hypertension   [facility], Janit Bern, MD   2 years ago Thyroid disease   [facility] Ellyn Hack, MD      Future Appointments            In 5 days Jamelle Haring, MD [facility]

## 2019-05-03 ENCOUNTER — Ambulatory Visit: Payer: Medicare Other | Admitting: Internal Medicine

## 2019-05-03 DIAGNOSIS — I420 Dilated cardiomyopathy: Secondary | ICD-10-CM | POA: Diagnosis present

## 2019-05-05 ENCOUNTER — Other Ambulatory Visit: Payer: Self-pay

## 2019-05-05 ENCOUNTER — Ambulatory Visit (INDEPENDENT_AMBULATORY_CARE_PROVIDER_SITE_OTHER): Payer: Medicare Other | Admitting: Internal Medicine

## 2019-05-05 ENCOUNTER — Encounter: Payer: Self-pay | Admitting: Internal Medicine

## 2019-05-05 VITALS — BP 122/84 | HR 67 | Temp 96.9°F | Resp 16 | Wt 131.0 lb

## 2019-05-05 DIAGNOSIS — M8589 Other specified disorders of bone density and structure, multiple sites: Secondary | ICD-10-CM | POA: Diagnosis not present

## 2019-05-05 DIAGNOSIS — I10 Essential (primary) hypertension: Secondary | ICD-10-CM

## 2019-05-05 DIAGNOSIS — E079 Disorder of thyroid, unspecified: Secondary | ICD-10-CM

## 2019-05-05 DIAGNOSIS — G309 Alzheimer's disease, unspecified: Secondary | ICD-10-CM

## 2019-05-05 DIAGNOSIS — R739 Hyperglycemia, unspecified: Secondary | ICD-10-CM

## 2019-05-05 DIAGNOSIS — I351 Nonrheumatic aortic (valve) insufficiency: Secondary | ICD-10-CM

## 2019-05-05 DIAGNOSIS — E782 Mixed hyperlipidemia: Secondary | ICD-10-CM

## 2019-05-05 DIAGNOSIS — F0280 Dementia in other diseases classified elsewhere without behavioral disturbance: Secondary | ICD-10-CM

## 2019-05-05 NOTE — Progress Notes (Signed)
Patient ID: [name], female    DOB: 1927/06/01, 86 y.o.   MRN: [id number]  PCP: Towanda Malkin, MD  Chief Complaint  Patient presents with  . Dementia  . Thyroid Problem  . Hypertension    Subjective:   [name] is a 86 y.o. female, presents to clinic with CC of the following:  Chief Complaint  Patient presents with  . Dementia  . Thyroid Problem  . Hypertension    HPI:  Patient is an 86 y.o. female who follows up today. Her son was with her today and was very helpful with history.  She was evaluated in the ER 03/02/2019 briefly for vomiting and lightheadedness, with her leaving before eval completed as patient was feeling better and was hungry noted.  At that visit, her BP was 194/42, P-57, T-97.6, Trop - 10, CBC - ok (WBC-10.3, not anemic), comp panel ok with glc 119.  The son notes in general, she eats very little when she does eat, although it was noted that her weight has remained stable, and the patient denies any recent nausea, vomiting, change in bowel habits like diarrhea, black stools, denies any problems peeing, no incontinence concerns, and notes that she is sleeping well.  She had her annual medicare wellness exam 10/21/2018: Noted has life alert, uses cane occasionally, has grab bars in BR and elevated toilet seat to help prevent falls. Has family support.  Her son and niece also live in the same house, although the patient is home alone when they are at work during the day.  His son noted she does not like to use the cane, although encouraged that today as I do think that will be very helpful trying to prevent falls.  She recently had a visit with cardiology on 05/03/2019, and her AI, chronic systolic CHF, HTN, Hyperlipidemia were addressed: Documentation from that visit:  A 2D echocardiogram was performed on 04/01/2018, which revealed LVEF 25-30% with moderate aortic and mitral regurgitation, mild tricuspid and pulmonic regurgitation,  moderate left atrial enlargement, and mild pulmonary hypertension. This LV function was significantly reduced from the previous echocardiogram in 2016, which revealed LVEF greater than 55%.   Conclusion by cardiology: The echocardiogram from 03/2018 showed that your heart function was reduced (your heart is not squeezing very strong) compared to the echocardiogram in 2016, which was normal heart function. Normally we would do a stress test to look further into why your heart function is reduced, and if there is a significant abnormality with the stress test, we consider a heart catheterization. You are not complaining of chest pain, shortness of breath, palpitations, passing out, or leg swelling. Talk it over with your family and let me know what you would like to do. I think it is reasonable to take a conservative approach and treat you with medications alone (beta blocker, ACEi/ARB, etc) at your advanced age and lack of symptoms   AI/CHF/dilated CM - as per above cardiology note No CP, SOB, palp's, LE swelling not increased and stable, no orthopnea.  Activity level - home vast majority of time (due to Covid),   HTN - remains on the metoprolol and Lasix, no SE concerns,  Diet - also low salt diet continues  BP checks at home - none BP Readings from Last 3 Encounters:  05/05/19 122/84  02/26/19 (!) 194/42  02/01/18 126/78   Hyperlipidemia - remains on crestor, tolerating to date Lab Results  Component Value Date   CHOL 189  02/01/2018   HDL 84 02/01/2018   LDLCALC 86 02/01/2018   TRIG 97 02/01/2018   CHOLHDL 2.3 02/01/2018    Dementia: Her history is remarkable for predominantly short term memory loss.Son notes not any major deterioration in recent past. She has support from her family,  No falls in recent past. She is taking namenda.  She hs family with her at home as above noted CCM was consulted to help in mid-2020.   Hypothyroidism: Was stable on prior supplement dose, and due to  COVID-19 and her advanced age, limited office visits to recheck in recent past. Lab Results  Component Value Date   TSH 1.98 02/01/2018   Osteopenia: Last BMD 08/2016. She is taking vitamin D supplement is still ambulatory.  Last Vit D level was ok. (43 in 06/2016) No h/o pathologic fractures.   Glaucoma: Sees Dr. Alvester Morin and managed with drops presently   Hyperglycemia hx, no DM hx:   Tob - never smoker  Patient Active Problem List   Diagnosis Date Noted  . Osteopenia 06/22/2017  . HTN (hypertension) 02/10/2017  . Dementia of the Alzheimer's type without behavioral disturbance (HCC) 11/04/2015  . Gradual-onset memory impairment 10/25/2015  . Thyroid disease 05/07/2015  . Hyperlipidemia 05/07/2015  . Hyperglycemia 05/07/2015  . Aortic insufficiency 06/14/2013      Current Outpatient Medications:  .  bimatoprost (LUMIGAN) 0.01 % SOLN, Place 1 drop into both eyes at bedtime. , Disp: , Rfl:  .  brinzolamide (AZOPT) 1 % ophthalmic suspension, Place 1 drop into both eyes 2 (two) times daily. , Disp: , Rfl:  .  furosemide (LASIX) 20 MG tablet, Take 20 mg by mouth daily., Disp: , Rfl:  .  levothyroxine (SYNTHROID) 75 MCG tablet, TAKE 1 TABLET BY MOUTH DAILY BEFORE BREAKFAST, Disp: 30 tablet, Rfl: 5 .  loratadine (CLARITIN) 10 MG tablet, Take 10 mg by mouth daily., Disp: , Rfl:  .  memantine (NAMENDA) 10 MG tablet, Take 1 tablet by mouth twice daily, Disp: 180 tablet, Rfl: 0 .  metoprolol succinate (TOPROL-XL) 25 MG 24 hr tablet, TAKE 1 TABLET BY MOUTH IN THE MORNING AND 2 NIGHTLY, Disp: 270 tablet, Rfl: 1 .  rosuvastatin (CRESTOR) 5 MG tablet, TAKE 1 TABLET BY MOUTH AT BEDTIME, Disp: 90 tablet, Rfl: 0 .  VITAMIN D, ERGOCALCIFEROL, PO, Take 2,000 tablets by mouth daily. , Disp: , Rfl:    No Known Allergies   Past Surgical History:  Procedure Laterality Date  . NO PAST SURGERIES       Family History  Problem Relation Age of Onset  . Cancer Father   . Hyperlipidemia Sister   .  Heart disease Brother   . Stroke Neg Hx      Social History   Socioeconomic History  . Marital status: Widowed    Spouse name: Not on file  . Number of children: 6  . Years of education: Not on file  . Highest education level: 10th grade  Occupational History    Employer: RETIRED  Tobacco Use  . Smoking status: Never Smoker  . Smokeless tobacco: Never Used  . Tobacco comment: smoking cessation materials not required  Substance and Sexual Activity  . Alcohol use: No    Alcohol/week: 0.0 standard drinks  . Drug use: No  . Sexual activity: Not Currently  Other Topics Concern  . Not on file  Social History Narrative  . Not on file   Social Determinants of Health   Financial Resource Strain:   .  Difficulty of Paying Living Expenses: Not on file  Food Insecurity:   . Worried About Programme researcher, broadcasting/film/video in the Last Year: Not on file  . Ran Out of Food in the Last Year: Not on file  Transportation Needs:   . Lack of Transportation (Medical): Not on file  . Lack of Transportation (Non-Medical): Not on file  Physical Activity:   . Days of Exercise per Week: Not on file  . Minutes of Exercise per Session: Not on file  Stress:   . Feeling of Stress : Not on file  Social Connections:   . Frequency of Communication with Friends and Family: Not on file  . Frequency of Social Gatherings with Friends and Family: Not on file  . Attends Religious Services: Not on file  . Active Member of Clubs or Organizations: Not on file  . Attends Banker Meetings: Not on file  . Marital Status: Not on file  Intimate Partner Violence:   . Fear of Current or Ex-Partner: Not on file  . Emotionally Abused: Not on file  . Physically Abused: Not on file  . Sexually Abused: Not on file    With staff assistance, above reviewed with the patient/caregiver today.  ROS: As per HPI, no h/o DM and denies any numbness/tingling or weakness in the extremities, otherwise no specific complaints  on a limited and focused system review    PHQ2/9: Depression screen [facility] 2/9 05/05/2019 10/21/2018 08/02/2018 10/01/2017 06/22/2017  Decreased Interest 0 0 0 0 0  Down, Depressed, Hopeless 0 0 0 0 0  PHQ - 2 Score 0 0 0 0 0  Altered sleeping 0 - 0 0 -  Tired, decreased energy 0 - 0 0 -  Change in appetite 0 - 0 0 -  Feeling bad or failure about yourself  - - 0 0 -  Trouble concentrating 0 - 0 0 -  Moving slowly or fidgety/restless 0 - 0 0 -  Suicidal thoughts 0 - 0 0 -  PHQ-9 Score 0 - 0 0 -  Difficult doing work/chores Not difficult at all - Not difficult at all Not difficult at all -   PHQ-2/9 Result is neg  Fall Risk: Fall Risk  05/05/2019 10/21/2018 08/02/2018 02/01/2018 10/01/2017  Falls in the past year? - 0 0 0 No  Number falls in past yr: 0 0 0 0 -  Injury with Fall? 0 0 0 - -  Risk for fall due to : - - - - Impaired vision;Other (Comment);Mental status change  Risk for fall due to: Comment - - - - wears eyeglasses; tinnitus; glaucoma; dementia  Follow up - Falls prevention discussed - - -      Objective:   Vitals:   05/05/19 1311  BP: 122/84  Pulse: 67  Resp: 16  Temp: (!) 96.9 F (36.1 C)  TempSrc: Temporal  SpO2: 98%  Weight: 131 lb (59.4 kg)    Body mass index is 22.49 kg/m.  Physical Exam   NAD, masked, pleasant HEENT - [place]/AT, sclera anicteric, + glasses, PERRL, EOMI, conj - non-inj'ed, pharynx clear Neck - supple, no adenopathy, no TM, carotids 2+ and = without bruits bilat Car - RRR with 1-2/6 systolic murmur present Pulm- RR and effort normal at rest, CTA without wheeze or rales Abd - soft, NT, ND, BS+,  no masses, no obvious HSM Back - no CVA tenderness Ext - 1+ LE edema, no active joints Neuro/psychiatric - affect was not flat, appropriate with  conversation  Alert   Grossly non-focal - good strength on testing extremities, sensation intact to LT in distal extremities  DTR's 1+ and = in patella, Speech normal, could balance standing in place, Romberg  neg, finger to nose deliberate, no past pointing  Results for orders placed or performed during the hospital encounter of 02/26/19  Lipase, blood  Result Value Ref Range   Lipase 20 11 - 51 U/L  Comprehensive metabolic panel  Result Value Ref Range   Sodium 140 135 - 145 mmol/L   Potassium 3.6 3.5 - 5.1 mmol/L   Chloride 103 98 - 111 mmol/L   CO2 27 22 - 32 mmol/L   Glucose, Bld 119 (H) 70 - 99 mg/dL   BUN 12 8 - 23 mg/dL   Creatinine, Ser 2.13 0.44 - 1.00 mg/dL   Calcium 9.3 8.9 - 08.6 mg/dL   Total Protein 7.7 6.5 - 8.1 g/dL   Albumin 3.8 3.5 - 5.0 g/dL   AST 15 15 - 41 U/L   ALT 10 0 - 44 U/L   Alkaline Phosphatase 50 38 - 126 U/L   Total Bilirubin 1.0 0.3 - 1.2 mg/dL   GFR calc non Af Amer >60 >60 mL/min   GFR calc Af Amer >60 >60 mL/min   Anion gap 10 5 - 15  CBC  Result Value Ref Range   WBC 10.3 4.0 - 10.5 K/uL   RBC 4.71 3.87 - 5.11 MIL/uL   Hemoglobin 14.1 12.0 - 15.0 g/dL   HCT 57.8 46.9 - 62.9 %   MCV 93.0 80.0 - 100.0 fL   MCH 29.9 26.0 - 34.0 pg   MCHC 32.2 30.0 - 36.0 g/dL   RDW 52.8 41.3 - 24.4 %   Platelets 224 150 - 400 K/uL   nRBC 0.0 0.0 - 0.2 %  Troponin I (High Sensitivity)  Result Value Ref Range   Troponin I (High Sensitivity) 10 <18 ng/L       Assessment & Plan:   1. Essential hypertension BP ok on recent visit with cardiology and ok today Continue with meds taking presently and cardiology input reviewed We will check a BMP noting the labs she is taking (not feel a CMP is needed as one was just done in December with the results noted above, and the liver function test and calcium normal on that check). - BASIC METABOLIC PANEL WITH GFR  2. Alzheimer's dementia without behavioral disturbance, unspecified timing of dementia onset (HCC) Status seems fairly stable per the son who was with her.  His input was very helpful today. Continue the Namenda medication. Emphasized the importance of preventing falls, with the appropriate things in place  to try to help do so.  She does have a life alert, and encouraged her to use the cane to get around for support, as feel that can only be helpful.  3. Thyroid disease Feel checking a TSH is important as 1 has not been checked in over a year.  Discussed concerns with low or high thyroid and signs of symptoms of each reviewed Continue supplement as await TSH check - TSH  4. Osteopenia of multiple sites Continue the vitamin D product.  She has not been taking the calcium supplement recently. Hold off on rechecking another vitamin D level at this time.  5. Mixed hyperlipidemia She is presently tolerating a statin product, and okay to continue.  Her last liver tests in December were good. Did not check another lipid panel today, with the  last one checked at the end of 2019 okay, and doubt any significant worsening since given her diet and clinical status.  If tolerating the Crestor as she is doing, we will continuing that is reasonable at this point  6. Hyperglycemia No history of diabetes in her past.  We will recheck her glucose today with the BMP.  May get an A1c in the future pending this result - BASIC METABOLIC PANEL WITH GFR  7. Nonrheumatic aortic valve insufficiency/CHF/dilated CM Input from cardiology was reviewed.  A conservative approach was felt reasonable, and the son noted that they have been discussing that as a family, with the next follow-up planned with cardiology in May.  Will schedule follow-up here in approximately 4 months, sooner as needed   Jamelle Haring, MD 05/05/19 1:20 PM

## 2019-05-06 LAB — BASIC METABOLIC PANEL WITH GFR
BUN: 11 mg/dL (ref 7–25)
CO2: 31 mmol/L (ref 20–32)
Calcium: 9.6 mg/dL (ref 8.6–10.4)
Chloride: 102 mmol/L (ref 98–110)
Creat: 0.74 mg/dL (ref 0.60–0.88)
GFR, Est African American: 83 mL/min/{1.73_m2} (ref >=60)
GFR, Est Non African American: 72 mL/min/{1.73_m2} (ref >=60)
Glucose, Bld: 85 mg/dL (ref 65–99)
Potassium: 3.7 mmol/L (ref 3.5–5.3)
Sodium: 142 mmol/L (ref 135–146)

## 2019-05-06 LAB — TSH: TSH: 1.68 mIU/L (ref 0.40–4.50)

## 2019-05-06 NOTE — Progress Notes (Signed)
Good news: The labs checked yesterday were all within the normal ranges and included a blood sugar, electrolytes, calcium, kidney function and a thyroid test (TSH).  Dr. Dorris Fetch

## 2019-06-01 ENCOUNTER — Other Ambulatory Visit: Payer: Self-pay

## 2019-06-01 DIAGNOSIS — I10 Essential (primary) hypertension: Secondary | ICD-10-CM

## 2019-06-01 MED ORDER — METOPROLOL SUCCINATE ER 25 MG PO TB24: ORAL_TABLET | ORAL | 1 refills | Status: DC

## 2019-06-18 ENCOUNTER — Other Ambulatory Visit: Payer: Self-pay | Admitting: Family Medicine

## 2019-06-18 DIAGNOSIS — E079 Disorder of thyroid, unspecified: Secondary | ICD-10-CM

## 2019-06-18 DIAGNOSIS — E782 Mixed hyperlipidemia: Secondary | ICD-10-CM

## 2019-06-18 NOTE — Telephone Encounter (Signed)
Requested medication (s) are due for refill today: yes  Requested medication (s) are on the active medication list: Euthyrox is not listed in AML; levothyroxine is name of previous prescription  Crestor is on AML  Last refill:  Levothyroxine: 08/02/18     Crestor: 03/27/19  Future visit scheduled: No  Notes to clinic:  Crestor: last lipid panel 11/19   Requested Prescriptions  Pending Prescriptions Disp Refills   EUTHYROX 75 MCG tablet [Pharmacy Med Name: Euthyrox 75 MCG Oral Tablet] 90 tablet 0    Sig: TAKE 1 TABLET BY MOUTH ONCE DAILY BEFORE BREAKFAST      Endocrinology:  Hypothyroid Agents Failed - 06/18/2019  6:41 PM      Failed - TSH needs to be rechecked within 3 months after an abnormal result. Refill until TSH is due.      Passed - TSH in normal range and within 360 days    TSH  Date Value Ref Range Status  05/05/2019 1.68 0.40 - 4.50 mIU/L Final          Passed - Valid encounter within last 12 months    Recent Outpatient Visits           1 month ago Essential hypertension   [place], [place]   10 months ago Late onset Alzheimer's disease without behavioral disturbance [facility])   [place], [place], FNP   1 year ago Essential hypertension   [place], Satira Anis, MD   1 year ago Late onset Alzheimer's disease without behavioral disturbance   [place], Satira Anis, MD   1 year ago Essential hypertension   [place], Satira Anis, MD       Future Appointments             In 2 months Towanda Malkin, MD [facility]              rosuvastatin (CRESTOR) 5 MG tablet [Pharmacy Med Name: Rosuvastatin Calcium 5 MG Oral Tablet] 90 tablet 0    Sig: TAKE 1 TABLET BY MOUTH AT BEDTIME      Cardiovascular:  Antilipid - Statins Failed - 06/18/2019  6:41 PM      Failed - Total Cholesterol in normal  range and within 360 days    Cholesterol, Total  Date Value Ref Range Status  05/07/2015 279 (H) 100 - 199 mg/dL Final   Cholesterol  Date Value Ref Range Status  02/01/2018 189 <200 mg/dL Final          Failed - LDL in normal range and within 360 days    LDL Cholesterol (Calc)  Date Value Ref Range Status  02/01/2018 86 mg/dL (calc) Final    Comment:    Reference range: <100 . Desirable range <100 mg/dL for primary prevention;   <70 mg/dL for patients with CHD or diabetic patients  with > or = 2 CHD risk factors. Marland Kitchen LDL-C is now calculated using the Martin-Hopkins  calculation, which is a validated novel method providing  better accuracy than the Friedewald equation in the  estimation of LDL-C.  Cresenciano Genre et al. Annamaria Helling. 0093;818(29): 2061-2068  (http://education.[facility].com/faq/FAQ164)           Failed - HDL in normal range and within 360 days    HDL  Date Value Ref Range Status  02/01/2018 84 >50 mg/dL Final  05/07/2015 77 >39 mg/dL Final  Failed - Triglycerides in normal range and within 360 days    Triglycerides  Date Value Ref Range Status  02/01/2018 97 <150 mg/dL Final          Passed - Patient is not pregnant      Passed - Valid encounter within last 12 months    Recent Outpatient Visits           1 month ago Essential hypertension   [facility] Welford Roche D, MD   10 months ago Late onset Alzheimer's disease without behavioral disturbance [facility])   [facility] Doren Custard, FNP   1 year ago Essential hypertension   [facility] [place], Janit Bern, MD   1 year ago Late onset Alzheimer's disease without behavioral disturbance   [facility] Lada, Janit Bern, MD   1 year ago Essential hypertension   [facility] [place], Janit Bern, MD       Future Appointments             In 2 months Jamelle Haring, MD [facility], [facility]

## 2019-07-23 ENCOUNTER — Other Ambulatory Visit: Payer: Self-pay | Admitting: Family Medicine

## 2019-07-23 DIAGNOSIS — F0280 Dementia in other diseases classified elsewhere without behavioral disturbance: Secondary | ICD-10-CM

## 2019-07-23 NOTE — Telephone Encounter (Signed)
Requested Prescriptions  Pending Prescriptions Disp Refills  . memantine (NAMENDA) 10 MG tablet [Pharmacy Med Name: Memantine HCl 10 MG Oral Tablet] 180 tablet 1    Sig: Take 1 tablet by mouth twice daily     Neurology:  Alzheimer's Agents Passed - 07/23/2019 11:56 AM      Passed - Valid encounter within last 6 months    Recent Outpatient Visits          2 months ago Essential hypertension   [facility] Welford Roche D, MD   11 months ago Late onset Alzheimer's disease without behavioral disturbance [facility])   [facility] Doren Custard, FNP   1 year ago Essential hypertension   [facility] Lada, Janit Bern, MD   1 year ago Late onset Alzheimer's disease without behavioral disturbance   [facility] Lada, Janit Bern, MD   2 years ago Essential hypertension   [facility] Lada, Janit Bern, MD      Future Appointments            In 1 month Jamelle Haring, MD [facility], [facility]

## 2019-08-30 NOTE — Progress Notes (Signed)
Patient ID: [name], female    DOB: 1927-08-08, 86 y.o.   MRN: [id number]  PCP: Deborah Malkin, MD  Chief Complaint  Patient presents with  . Follow-up  . Hyperlipidemia  . Hypertension  . Hypothyroidism    Subjective:   [name] is a 86 y.o. female, presents to clinic with CC of the following:  Chief Complaint  Patient presents with  . Follow-up  . Hyperlipidemia  . Hypertension  . Hypothyroidism    HPI:  Patient is an 86 year old female whom I first saw 05/05/2019 Her son was present at that visit and very helpful with history. She follows up today with her son present and helpful. She notes that she feels well, and things have been going well.  She followed up with cardiology on 08/07/2019 with her to continue her current medications and the cardiologist noting a conservative approach to LV dysfunction in an asymptomatic, elderly, frail female with dementia Her AI, chronic systolic CHF, HTN, Hyperlipidemia were addressed at that visit. Her last echocardiogram was performed on 04/01/2018, which revealed LVEF 25-30% with moderate aortic and mitral regurgitation, mild tricuspid and pulmonic regurgitation, moderate left atrial enlargement, and mild pulmonary hypertension. This LV function was significantly reduced from the previous echocardiogram in 2016, which revealed LVEF greater than 55%.   She had her last annual medicare wellness exam 10/21/2018: Noted has life alert, uses cane occasionally, has grab bars in BR and elevated toilet seat to help prevent falls. Has family support.  Her son and niece also live in the same house, although the patient is home alone when they are at work during the day.  The son noted she does not like to use the cane today at our visit, and I encouraged to her today to use that cane often for support and fall prevention as I do think that will be very helpful.  AI/CHF/dilated CM - as per above cardiology  note Denies CP, SOB, palp's, LE swelling not increased and stable, no orthopnea.  Activity level -remain home vast majority of time, she notes she does housework, laundry at times   HTN - medication regimen- metoprolol and Lasix,   Taking medications regularly BP checks at home - none BP Readings from Last 3 Encounters:  08/31/19 114/70  05/05/19 122/84  02/26/19 (!) 270/35    Last metabolic panel Lab Results  Component Value Date   GLUCOSE 85 05/05/2019   NA 142 05/05/2019   K 3.7 05/05/2019   CL 102 05/05/2019   CO2 31 05/05/2019   BUN 11 05/05/2019   CREATININE 0.74 05/05/2019   GFRNONAA 72 05/05/2019   GFRAA 83 05/05/2019   CALCIUM 9.6 05/05/2019   PROT 7.7 02/26/2019   ALBUMIN 3.8 02/26/2019   BILITOT 1.0 02/26/2019   ALKPHOS 50 02/26/2019   AST 15 02/26/2019   ALT 10 02/26/2019   ANIONGAP 10 02/26/2019    Hyperlipidemia - remains on crestor - 5mg , tolerating to date Lab Results  Component Value Date   CHOL 189 02/01/2018   HDL 84 02/01/2018   LDLCALC 86 02/01/2018   TRIG 97 02/01/2018   CHOLHDL 2.3 02/01/2018   Dementia: Her history is remarkable for predominantly short term memory loss. Son notes no major deterioration in recent past. She has support from her family, No falls in recent past. She is taking namenda.  She has family with her at home as above noted  Hypothyroidism: Last TSH was good. Remains on thyroid  supplement once daily Lab Results  Component Value Date   TSH 1.68 05/05/2019    Osteopenia: Last BMD 08/2016. She is taking vitamin D supplement is still ambulatory.  Last Vit D level was ok. (43 in 06/2016) No h/o pathologic fractures.   Glaucoma: Continues to see Dr. Alvester Mack and managed with drops presently  Hyperglycemia hx, no DM hx:  Last glucose check normal as above noted No results found for: HGBA1C Lab Results  Component Value Date   LDLCALC 86 02/01/2018   CREATININE 0.74 05/05/2019    Tob - never smoker   Patient  Active Problem List   Diagnosis Date Noted  . Osteopenia 06/22/2017  . HTN (hypertension) 02/10/2017  . Dementia of the Alzheimer's type without behavioral disturbance (HCC) 11/04/2015  . Gradual-onset memory impairment 10/25/2015  . Thyroid disease 05/07/2015  . Hyperlipidemia 05/07/2015  . Hyperglycemia 05/07/2015  . Aortic insufficiency 06/14/2013      Current Outpatient Medications:  .  bimatoprost (LUMIGAN) 0.01 % SOLN, Place 1 drop into both eyes at bedtime. , Disp: , Rfl:  .  brinzolamide (AZOPT) 1 % ophthalmic suspension, Place 1 drop into both eyes 2 (two) times daily. , Disp: , Rfl:  .  EUTHYROX 75 MCG tablet, TAKE 1 TABLET BY MOUTH ONCE DAILY BEFORE BREAKFAST, Disp: 90 tablet, Rfl: 3 .  furosemide (LASIX) 20 MG tablet, Take 20 mg by mouth daily., Disp: , Rfl:  .  memantine (NAMENDA) 10 MG tablet, Take 1 tablet by mouth twice daily, Disp: 180 tablet, Rfl: 1 .  metoprolol succinate (TOPROL-XL) 25 MG 24 hr tablet, Take 1 tablet by mouth in the morning and 2 nightly, Disp: 270 tablet, Rfl: 1 .  rosuvastatin (CRESTOR) 5 MG tablet, TAKE 1 TABLET BY MOUTH AT BEDTIME, Disp: 90 tablet, Rfl: 3 .  VITAMIN D, ERGOCALCIFEROL, PO, Take 2,000 tablets by mouth daily. , Disp: , Rfl:    No Known Allergies   Past Surgical History:  Procedure Laterality Date  . NO PAST SURGERIES       Family History  Problem Relation Age of Onset  . Cancer Father   . Hyperlipidemia Sister   . Heart disease Brother   . Stroke Neg Hx      Social History   Tobacco Use  . Smoking status: Never Smoker  . Smokeless tobacco: Never Used  . Tobacco comment: smoking cessation materials not required  Substance Use Topics  . Alcohol use: No    Alcohol/week: 0.0 standard drinks    With staff assistance, above reviewed with the patient/caregiver today.  ROS: As per HPI, denies any recent fevers or infectious symptoms, no abdominal pains, no black or dark stools, no difficulty urinating, otherwise no  specific complaints on a limited and focused system review   No results found for this or any previous visit (from the past 72 hour(s)).   PHQ2/9: Depression screen [facility] 2/9 08/31/2019 05/05/2019 10/21/2018 08/02/2018 10/01/2017  Decreased Interest 0 0 0 0 0  Down, Depressed, Hopeless 0 0 0 0 0  PHQ - 2 Score 0 0 0 0 0  Altered sleeping 0 0 - 0 0  Tired, decreased energy 0 0 - 0 0  Change in appetite 0 0 - 0 0  Feeling bad or failure about yourself  0 - - 0 0  Trouble concentrating 0 0 - 0 0  Moving slowly or fidgety/restless 0 0 - 0 0  Suicidal thoughts 0 0 - 0 0  PHQ-9 Score 0 0 -  0 0  Difficult doing work/chores Not difficult at all Not difficult at all - Not difficult at all Not difficult at all   PHQ-2/9 Result is neg  Fall Risk: Fall Risk  08/31/2019 05/05/2019 10/21/2018 08/02/2018 02/01/2018  Falls in the past year? 0 - 0 0 0  Number falls in past yr: 0 0 0 0 0  Injury with Fall? 0 0 0 0 -  Risk for fall due to : - - - - -  Risk for fall due to: Comment - - - - -  Follow up - - Falls prevention discussed - -      Objective:   Vitals:   08/31/19 1454  BP: 114/70  Pulse: (!) 59  Resp: 16  Temp: 98.1 F (36.7 C)  TempSrc: Temporal  SpO2: 98%  Weight: 134 lb 1.6 oz (60.8 kg)  Height: 5\' 5"  (1.651 m)    Body mass index is 22.32 kg/m.  Physical Exam   NAD, masked, pleasant HEENT - [place]/AT, sclera anicteric, + glasses, PERRL, EOMI, conj - non-inj'ed, pharynx clear Neck - supple, no adenopathy, no TM, carotids 2+ and = without bruits bilat Car - RRR with 1/6 systolic murmur present Pulm- RR and effort normal at rest, CTA without wheeze or rales Abd - soft, NT diffusely Back - no CVA tenderness Ext - 1+ LE edema on left, trace on right,  Skin -multiple small ecchymotic regions on the forearms bilateral Neuro/psychiatric - affect was not flat, appropriate with conversation             Alert              Grossly non-focal - good strength on testing extremities, sensation  intact to LT in distal extremities             DTR's 1+ and = in patella, Speech normal, could balance standing in place, Romberg neg, finger to nose deliberate, no past pointing  Results for orders placed or performed in visit on 05/05/19  TSH  Result Value Ref Range   TSH 1.68 0.40 - 4.50 mIU/L  BASIC METABOLIC PANEL WITH GFR  Result Value Ref Range   Glucose, Bld 85 65 - 99 mg/dL   BUN 11 7 - 25 mg/dL   Creat 05/07/19 0.86 - 5.78 mg/dL   GFR, Est Non African American 72 > OR = 60 mL/min/1.40m2   GFR, Est African American 83 > OR = 60 mL/min/1.57m2   BUN/Creatinine Ratio NOT APPLICABLE 6 - 22 (calc)   Sodium 142 135 - 146 mmol/L   Potassium 3.7 3.5 - 5.3 mmol/L   Chloride 102 98 - 110 mmol/L   CO2 31 20 - 32 mmol/L   Calcium 9.6 8.6 - 10.4 mg/dL       Assessment & Plan:     1. Essential hypertension Blood pressure remains well controlled Continue with meds taking presently and cardiology input reviewed Last BMP was reviewed  2. Alzheimer's dementia without behavioral disturbance, unspecified timing of dementia onset (HCC) Status seems fairly stable per the son who was with her.  His input was very helpful today. Continue the Namenda medication. Emphasized the importance of preventing falls again today, She does have a life alert, and encouraged her to use the cane to get around for support, as feel that can only be helpful.  3. Thyroid disease Last TSH was reviewed and good. Continue the thyroid supplement.  4. Osteopenia of multiple sites Continue the vitamin D supplement  5. Mixed hyperlipidemia Her last lipid panel in 2019 was reviewed and was quite good. She is on a very low dose of the statin presently, and discussed with her the potential of stopping this medicine as not sure the risk-benefit ratio favors the benefit. She was very much in favor of stopping the medicine if she could, and will stop the rosuvastatin medicine presently.  6. Hyperglycemia No  history of diabetes in her past.   Her last blood sugar was good.  7. Nonrheumatic aortic valve insufficiency/CHF/dilated CM Input from cardiology was reviewed.  A conservative approach was felt reasonable and continues..  8. Senile purpura Patient reassured  Did not feel repeating further labs was needed today, and patient will follow up again in about 5 months time, sooner as needed.     Jamelle Haring, MD 08/31/19 3:01 PM

## 2019-08-31 ENCOUNTER — Encounter: Payer: Self-pay | Admitting: Internal Medicine

## 2019-08-31 ENCOUNTER — Other Ambulatory Visit: Payer: Self-pay

## 2019-08-31 ENCOUNTER — Ambulatory Visit (INDEPENDENT_AMBULATORY_CARE_PROVIDER_SITE_OTHER): Payer: Medicare Other | Admitting: Internal Medicine

## 2019-08-31 VITALS — BP 114/70 | HR 59 | Temp 98.1°F | Resp 16 | Ht 65.0 in | Wt 134.1 lb

## 2019-08-31 DIAGNOSIS — F0280 Dementia in other diseases classified elsewhere without behavioral disturbance: Secondary | ICD-10-CM

## 2019-08-31 DIAGNOSIS — M8589 Other specified disorders of bone density and structure, multiple sites: Secondary | ICD-10-CM

## 2019-08-31 DIAGNOSIS — E079 Disorder of thyroid, unspecified: Secondary | ICD-10-CM

## 2019-08-31 DIAGNOSIS — D692 Other nonthrombocytopenic purpura: Secondary | ICD-10-CM

## 2019-08-31 DIAGNOSIS — G309 Alzheimer's disease, unspecified: Secondary | ICD-10-CM

## 2019-08-31 DIAGNOSIS — I351 Nonrheumatic aortic (valve) insufficiency: Secondary | ICD-10-CM

## 2019-08-31 DIAGNOSIS — E782 Mixed hyperlipidemia: Secondary | ICD-10-CM

## 2019-08-31 DIAGNOSIS — I10 Essential (primary) hypertension: Secondary | ICD-10-CM

## 2019-08-31 DIAGNOSIS — R739 Hyperglycemia, unspecified: Secondary | ICD-10-CM

## 2019-08-31 NOTE — Patient Instructions (Signed)
Stop the rosuvastatin (crestor).

## 2019-09-01 ENCOUNTER — Ambulatory Visit: Payer: Medicare Other | Admitting: Internal Medicine

## 2019-10-11 NOTE — Progress Notes (Signed)
Patient ID: [name], female    DOB: 1928-02-19, 86 y.o.   MRN: [id number]  PCP: Jamelle Haring, MD  Chief Complaint  Patient presents with  . Dementia    interested in getting referral for Home health, they have checked with her insurance and it is covered    Subjective:   [name] is a 86 y.o. female, presents to clinic with CC of the following:  Chief Complaint  Patient presents with  . Dementia    interested in getting referral for Home health, they have checked with her insurance and it is covered    HPI:  Patient is an 86 year old female  last visit with me was 08/31/2019 with that note reviewed. Follows up today. Her son and daughter was present with her today for her visit She notes that she feels well, and things have been going well.  The family echoed concerns with her being home alone in the early morning, often from 0700 to about 10:30 in the morning. They do not believe she is out of the house over this time, although do have concerns with her wandering and her dementia. Not entirely certain she does not leave the house. Also, they worry she is a fall risk, and has had no recent falls, but very close to falls noted. She shuffles with her gait, and refuses to use a cane despite the recommendations to do so. She at times has some shakiness with doing things, although not significantly limiting for her to do things presently. They have looked into the insurance, and noted that they can get home health covered with a referral from the provider. They are very much looking for assistance to help presently. She did to see the eye doctor yesterday, with her vision noted to be okay and no change in her prescription.   She denies any recent chest pains, palpitations, shortness of breath, or increased lower extremity swelling. Activity level remains the same, is home the vast majority of the time, does try to do some housework at times like  laundry. She last followed up with cardiology on 08/07/2019 with her to continue her current medications and the cardiologist noting a conservative approach to LV dysfunction in an asymptomatic, elderly, frail female with dementia Her AI, chronic systolicCHF, dilated CM, HTN, Hyperlipidemia were addressed at that visit. Her last echocardiogram was performed on 04/01/2018, which revealed LVEF 25-30% with moderate aortic and mitral regurgitation, mild tricuspid and pulmonic regurgitation, moderate left atrial enlargement, and mild pulmonary hypertension. This LV function was significantly reduced from the previous echocardiogram in 2016, which revealed LVEF greater than 55%.   She had her last annual medicare wellness exam 10/21/2018: Noted has life alert, uses cane occasionally although per the family not using in the recent past, has grab bars in BR and elevated toilet seat to help prevent falls.Has family support.Her son and niece also live in the same house, although the patient is home alone when they are at work during the day.   HTN-medication regimen- metoprolol and Lasix,   Taking medications regularly BP checks at home -none BP Readings from Last 3 Encounters:  10/12/19 (!) 124/60  08/31/19 114/70  05/05/19 122/84     Last metabolic panel      Lab Results  Component Value Date   GLUCOSE 85 05/05/2019   NA 142 05/05/2019   K 3.7 05/05/2019   CL 102 05/05/2019   CO2 31 05/05/2019   BUN 11  05/05/2019   CREATININE 0.74 05/05/2019   GFRNONAA 72 05/05/2019   GFRAA 83 05/05/2019   CALCIUM 9.6 05/05/2019   PROT 7.7 02/26/2019   ALBUMIN 3.8 02/26/2019   BILITOT 1.0 02/26/2019   ALKPHOS 50 02/26/2019   AST 15 02/26/2019   ALT 10 02/26/2019   ANIONGAP 10 02/26/2019    Hyperlipidemia-remains on crestor - 5mg , tolerating to date      Lab Results  Component Value Date   CHOL 189 02/01/2018   HDL 84 02/01/2018   LDLCALC 86 02/01/2018   TRIG 97  02/01/2018   CHOLHDL 2.3 02/01/2018   Dementia:Her history is remarkable forpredominantly short term memory loss She has support from her family,No falls in recent past, but concerns for near falls as noted above. She is taking namenda.  Shehas family with her at home as above noted  Hypothyroidism: Last TSH was good. Remains on thyroid supplement once daily      Lab Results  Component Value Date   TSH 1.68 05/05/2019    Osteopenia: Last BMD 08/2016. She is taking vitamin D supplement is still ambulatory. Last Vit D level was ok. (43 in 06/2016) No h/o pathologic fractures.   Glaucoma: Continues to see Dr. 07/2016 and managed with drops presently  Hyperglycemia hx, no DM hx: Last glucose check normal as above noted Recent Labs  No results found for: HGBA1C   Recent Labs       Lab Results  Component Value Date   LDLCALC 86 02/01/2018   CREATININE 0.74 05/05/2019      Tob - never smoker   Patient Active Problem List   Diagnosis Date Noted  . Chronic systolic heart failure (HCC) 10/12/2019  . At risk for fall due to comorbid condition 10/12/2019  . Senile purpura (HCC) 08/31/2019  . Osteopenia 06/22/2017  . HTN (hypertension) 02/10/2017  . Dementia of the Alzheimer's type without behavioral disturbance (HCC) 11/04/2015  . Gradual-onset memory impairment 10/25/2015  . Hypothyroidism 05/07/2015  . Hyperlipidemia 05/07/2015  . Hyperglycemia 05/07/2015  . Aortic insufficiency 06/14/2013      Current Outpatient Medications:  .  bimatoprost (LUMIGAN) 0.01 % SOLN, Place 1 drop into both eyes at bedtime. , Disp: , Rfl:  .  brinzolamide (AZOPT) 1 % ophthalmic suspension, Place 1 drop into both eyes 2 (two) times daily. , Disp: , Rfl:  .  EUTHYROX 75 MCG tablet, TAKE 1 TABLET BY MOUTH ONCE DAILY BEFORE BREAKFAST, Disp: 90 tablet, Rfl: 3 .  furosemide (LASIX) 20 MG tablet, Take 20 mg by mouth daily., Disp: , Rfl:  .  memantine (NAMENDA) 10 MG tablet, Take  1 tablet by mouth twice daily, Disp: 180 tablet, Rfl: 1 .  VITAMIN D, ERGOCALCIFEROL, PO, Take 2,000 tablets by mouth daily. , Disp: , Rfl:  .  metoprolol succinate (TOPROL-XL) 25 MG 24 hr tablet, Take 1 tablet by mouth in the morning and 2 nightly, Disp: 270 tablet, Rfl: 1 .  rosuvastatin (CRESTOR) 5 MG tablet, TAKE 1 TABLET BY MOUTH AT BEDTIME, Disp: 90 tablet, Rfl: 3   No Known Allergies   Past Surgical History:  Procedure Laterality Date  . NO PAST SURGERIES       Family History  Problem Relation Age of Onset  . Cancer Father   . Hyperlipidemia Sister   . Heart disease Brother   . Stroke Neg Hx      Social History   Tobacco Use  . Smoking status: Never Smoker  . Smokeless tobacco: Never  Used  . Tobacco comment: smoking cessation materials not required  Substance Use Topics  . Alcohol use: No    Alcohol/week: 0.0 standard drinks    With staff assistance, above reviewed with the patient/caregiver today.  ROS: As per HPI, otherwise no specific complaints on a limited and focused system review   No results found for this or any previous visit (from the past 72 hour(s)).   PHQ2/9: Depression screen [facility] 2/9 08/31/2019 05/05/2019 10/21/2018 08/02/2018 10/01/2017  Decreased Interest 0 0 0 0 0  Down, Depressed, Hopeless 0 0 0 0 0  PHQ - 2 Score 0 0 0 0 0  Altered sleeping 0 0 - 0 0  Tired, decreased energy 0 0 - 0 0  Change in appetite 0 0 - 0 0  Feeling bad or failure about yourself  0 - - 0 0  Trouble concentrating 0 0 - 0 0  Moving slowly or fidgety/restless 0 0 - 0 0  Suicidal thoughts 0 0 - 0 0  PHQ-9 Score 0 0 - 0 0  Difficult doing work/chores Not difficult at all Not difficult at all - Not difficult at all Not difficult at all   PHQ-2/9 from June reviewed  Fall Risk: Fall Risk  10/12/2019 08/31/2019 05/05/2019 10/21/2018 08/02/2018  Falls in the past year? 0 0 - 0 0  Number falls in past yr: 0 0 0 0 0  Injury with Fall? 0 0 0 0 0  Risk for fall due to : - - - -  -  Risk for fall due to: Comment - - - - -  Follow up - - - Falls prevention discussed -      Objective:   Vitals:   10/12/19 0855  BP: (!) 124/60  Pulse: 67  Resp: 16  Temp: 97.8 F (36.6 C)  TempSrc: Temporal  SpO2: 97%  Weight: 134 lb 9.6 oz (61.1 kg)  Height: 5\' 5"  (1.651 m)    Body mass index is 22.4 kg/m.  Physical Exam   NAD, masked, pleasant HEENT - [place]/AT, sclera anicteric,+ glasses,PERRL, EOMI, conj - non-inj'ed,  Neck - supple, no adenopathy, no TM, no JVD,  Car - RRR with1-II/6 systolic murmur present Pulm- RR and effort normal at rest, CTA without wheeze or rales Abd - soft, NT diffusely Ext -1+LE edema on left, trace on right, no change from previous Neuro/psychiatric - affect was not flat, appropriate with conversation Alert  Grossly non-focal  Speechnormal, could balance standing in place, shuffles with her gait, although relatively stable on brief ambulation in the office, finger to nose deliberate, no past pointing, no resting tremor noted, no intention tremor noted on finger-to-nose testing  Results for orders placed or performed in visit on 05/05/19  TSH  Result Value Ref Range   TSH 1.68 0.40 - 4.50 mIU/L  BASIC METABOLIC PANEL WITH GFR  Result Value Ref Range   Glucose, Bld 85 65 - 99 mg/dL   BUN 11 7 - 25 mg/dL   Creat 05/07/19 1.61 - 0.96 mg/dL   GFR, Est Non African American 72 > OR = 60 mL/min/1.59m2   GFR, Est African American 83 > OR = 60 mL/min/1.18m2   BUN/Creatinine Ratio NOT APPLICABLE 6 - 22 (calc)   Sodium 142 135 - 146 mmol/L   Potassium 3.7 3.5 - 5.3 mmol/L   Chloride 102 98 - 110 mmol/L   CO2 31 20 - 32 mmol/L   Calcium 9.6 8.6 - 10.4 mg/dL   Last  labs reviewed.    Assessment & Plan:    1. Alzheimer's dementia without behavioral disturbance, unspecified timing of dementia onset (HCC) Concerned with her home alone at times, as noted above by the family. Agree with those concerns, and  do feel a referral to home health is indicated, with CCM consulted to help with that and help with additional resources that may be needed. Do feel some form of OT/PT may be helpful as well with fall risk a concern. Emphasized to the patient today at length to use the cane, and the importance of that. Did just have a very recent eye exam, and do not feel vision is contributing. Remains on Namenda presently. - Ambulatory referral to [facility]  2. Essential hypertension Blood pressure remained stable.  3. Nonrheumatic aortic valve insufficiency 4. Chronic systolic heart failure [facility](HCC) Patient with known heart disease history, and monitoring presently.  7. At risk for fall due to comorbid condition  - Ambulatory referral to Chronic Care Management Services   Agree with the referral to home health services and gaining further help with potential resources presently for caregiving at home.  Keep the follow-up planned from our previous visit, follow-up sooner as needed.   Jamelle HaringLIFFORD D Monterius Rolf, MD 10/12/19 9:29 AM

## 2019-10-12 ENCOUNTER — Ambulatory Visit: Payer: Self-pay | Admitting: *Deleted

## 2019-10-12 ENCOUNTER — Encounter: Payer: Self-pay | Admitting: Internal Medicine

## 2019-10-12 ENCOUNTER — Other Ambulatory Visit: Payer: Self-pay

## 2019-10-12 ENCOUNTER — Ambulatory Visit (INDEPENDENT_AMBULATORY_CARE_PROVIDER_SITE_OTHER): Payer: Medicare Other | Admitting: Internal Medicine

## 2019-10-12 VITALS — BP 124/60 | HR 67 | Temp 97.8°F | Resp 16 | Ht 65.0 in | Wt 134.6 lb

## 2019-10-12 DIAGNOSIS — I10 Essential (primary) hypertension: Secondary | ICD-10-CM

## 2019-10-12 DIAGNOSIS — F0280 Dementia in other diseases classified elsewhere without behavioral disturbance: Secondary | ICD-10-CM

## 2019-10-12 DIAGNOSIS — G309 Alzheimer's disease, unspecified: Secondary | ICD-10-CM

## 2019-10-12 DIAGNOSIS — Z9181 History of falling: Secondary | ICD-10-CM

## 2019-10-12 DIAGNOSIS — E039 Hypothyroidism, unspecified: Secondary | ICD-10-CM | POA: Diagnosis not present

## 2019-10-12 DIAGNOSIS — I351 Nonrheumatic aortic (valve) insufficiency: Secondary | ICD-10-CM

## 2019-10-12 DIAGNOSIS — I5022 Chronic systolic (congestive) heart failure: Secondary | ICD-10-CM

## 2019-10-12 DIAGNOSIS — E782 Mixed hyperlipidemia: Secondary | ICD-10-CM | POA: Diagnosis not present

## 2019-10-12 NOTE — Chronic Care Management (AMB) (Signed)
Chronic Care Management    Clinical Social Work Follow Up Note  10/12/2019 Name: [name] MRN: [id number] DOB: Sep 13, 1927  [name] is a 86 y.o. year old female who is a primary care patient of Jamelle Haring, MD. The CCM team was consulted for assistance with [employer] .   Review of patient status, including review of consultants reports, other relevant assessments, and collaboration with appropriate care team members and the patient's provider was performed as part of comprehensive patient evaluation and provision of chronic care management services.    SDOH (Social Determinants of Health) assessments performed: No    Outpatient Encounter Medications as of 10/12/2019  Medication Sig Note  . bimatoprost (LUMIGAN) 0.01 % SOLN Place 1 drop into both eyes at bedtime.  05/07/2015: Received from: [facility]  . brinzolamide (AZOPT) 1 % ophthalmic suspension Place 1 drop into both eyes 2 (two) times daily.  05/07/2015: Received from: [facility]  . EUTHYROX 75 MCG tablet TAKE 1 TABLET BY MOUTH ONCE DAILY BEFORE BREAKFAST   . furosemide (LASIX) 20 MG tablet Take 20 mg by mouth daily.   . memantine (NAMENDA) 10 MG tablet Take 1 tablet by mouth twice daily   . metoprolol succinate (TOPROL-XL) 25 MG 24 hr tablet Take 1 tablet by mouth in the morning and 2 nightly   . rosuvastatin (CRESTOR) 5 MG tablet TAKE 1 TABLET BY MOUTH AT BEDTIME   . VITAMIN D, ERGOCALCIFEROL, PO Take 2,000 tablets by mouth daily.     No facility-administered encounter medications on file as of 10/12/2019.     Goals Addressed              This Visit's Progress   .  "I would like some resources to help take care of my mom" completed (pt-stated)        Current Barriers:  . ADL IADL limitations . Cognitive Deficits . Inability to perform ADL's independently . Inability to perform IADL's independently  Clinical Social Work Clinical Goal(s):   Marland Kitchen Over the next 30 days, client's daughter will follow up with community resources provided as directed by SW as needed   Interventions: . Provided patient's daughter with information about available community resources for day programs, in home care and educational resources for dementia care and support . Provided patient's daughter with encouragement and support related to care practices already put in place for patient.   Patient Self Care Activities:  . Currently UNABLE TO independently perform ADL's and IADL's  Initial goal documentation     .  per patient's daughter"I think my mom needs more supervison during the day"" (pt-stated)        CARE PLAN ENTRY (see longitudinal plan of care for additional care plan information)  Current Barriers:  . ADL IADL limitations  Clinical Social Work Clinical Goal(s):  Marland Kitchen Over the next 90 days, patient will work with SW to address concerns related to coordinating additional help in the home  Interventions: . Inter-disciplinary care team collaboration (see longitudinal plan of care) . Patient's daughter interviewed and appropriate assessments performed . Discussed need for supervision during the day due to patient's condition progressing . Confirmed that patient resides with her son and granddaughter, however they both work during the day. Granddaughter confirmed to prepare patient's medicines, meals and will put out patient's clothes for her to put on . Hygiene remains difficult to reinforce . Confirmed that patient has Life Alert but patient has lost the  ability to know when to use it . Provided patient's daughter  with information about Home Health Services vs. Personal Care Services . Discussed plans with patient for ongoing care management follow up and provided patient with direct contact information for care management team . Advised patient to contact her insurance coverage to confirm type of Home Health Service coverage  Patient  Self Care Activities:  . Attends all scheduled provider appointments . Unable to perform ADLs independently . Unable to perform IADLs independently  Initial goal documentation         Follow Up Plan: SW will follow up with patient by phone over the next 7-10 business days   Megyn Leng, [place] Clinical Social Worker  [facility] Care Management [phone]

## 2019-10-12 NOTE — Patient Instructions (Signed)
Referral was placed today for the chronic care management team to help acquire home health services.

## 2019-10-12 NOTE — Patient Instructions (Signed)
Thank you allowing the Chronic Care Management Team to be a part of your care! It was a pleasure speaking with you today!  1. Please call your insurance company to confirm personal care services coverage  CCM (Chronic Care Management) Team   Juanell Fairly RN, BSN Nurse Care Coordinator  [phone]  Lashina Milles [address] LCSW Clinical Social Worker [phone]  Goals Addressed              This Visit's Progress   .  "I would like some resources to help take care of my mom" completed (pt-stated)        Current Barriers:  . ADL IADL limitations . Cognitive Deficits . Inability to perform ADL's independently . Inability to perform IADL's independently  Clinical Social Work Clinical Goal(s):  Marland Kitchen Over the next 30 days, client's daughter will follow up with community resources provided as directed by SW as needed   Interventions: . Provided patient's daughter with information about available community resources for day programs, in home care and educational resources for dementia care and support . Provided patient's daughter with encouragement and support related to care practices already put in place for patient.   Patient Self Care Activities:  . Currently UNABLE TO independently perform ADL's and IADL's  Initial goal documentation     .  per patient's daughter"I think my mom needs more supervison during the day"" (pt-stated)        CARE PLAN ENTRY (see longitudinal plan of care for additional care plan information)  Current Barriers:  . ADL IADL limitations  Clinical Social Work Clinical Goal(s):  Marland Kitchen Over the next 90 days, patient will work with SW to address concerns related to coordinating additional help in the home  Interventions: . Inter-disciplinary care team collaboration (see longitudinal plan of care) . Patient's daughter interviewed and appropriate assessments performed . Discussed need for supervision during the day due to patient's condition  progressing . Confirmed that patient resides with her son and granddaughter, however they both work during the day. Granddaughter confirmed to prepare patient's medicines, meals and will put out patient's clothes for her to put on . Hygiene remains difficult to reinforce . Confirmed that patient has Life Alert but patient has lost the ability to know when to use it . Provided patient's daughter  with information about Home Health Services vs. Personal Care Services . Discussed plans with patient for ongoing care management follow up and provided patient with direct contact information for care management team . Advised patient to contact her insurance coverage to confirm type of Home Health Service coverage  Patient Self Care Activities:  . Attends all scheduled provider appointments . Unable to perform ADLs independently . Unable to perform IADLs independently  Initial goal documentation         The patient verbalized understanding of instructions provided today and declined a print copy of patient instruction materials.   Telephone follow up appointment with care management team member scheduled for: 10/18/19

## 2019-10-13 ENCOUNTER — Telehealth: Payer: Self-pay | Admitting: Internal Medicine

## 2019-10-13 NOTE — Telephone Encounter (Signed)
Copied from CRM [id number]. Topic: Medicare AWV >> Oct 13, 2019  9:42 AM Claudette Laws R wrote: Reason for CRM:  Left message for patient to call back and schedule the Medicare Annual Wellness Visit (AWV) in office or virtual  Last AWV 10/21/2018  Please schedule at anytime with [facility] Health Advisor.  40 minute appointment  Any questions, please contact me at [phone]

## 2019-10-18 ENCOUNTER — Ambulatory Visit: Payer: Self-pay | Admitting: *Deleted

## 2019-10-18 ENCOUNTER — Encounter: Payer: Self-pay | Admitting: *Deleted

## 2019-10-18 DIAGNOSIS — G309 Alzheimer's disease, unspecified: Secondary | ICD-10-CM

## 2019-10-18 DIAGNOSIS — I10 Essential (primary) hypertension: Secondary | ICD-10-CM

## 2019-10-18 NOTE — Chronic Care Management (AMB) (Signed)
°  Chronic Care Management    Clinical Social Work Follow Up Note  10/18/2019 Name: [name] MRN: [id number] DOB: Mar 15, 1928  [name] is a 86 y.o. year old female who is a primary care patient of Jamelle Haring, MD. The CCM team was consulted for assistance with [employer] .   Review of patient status, including review of consultants reports, other relevant assessments, and collaboration with appropriate care team members and the patient's provider was performed as part of comprehensive patient evaluation and provision of chronic care management services.    SDOH (Social Determinants of Health) assessments performed: No    Outpatient Encounter Medications as of 10/18/2019  Medication Sig Note   bimatoprost (LUMIGAN) 0.01 % SOLN Place 1 drop into both eyes at bedtime.  05/07/2015: Received from: [facility]   brinzolamide (AZOPT) 1 % ophthalmic suspension Place 1 drop into both eyes 2 (two) times daily.  05/07/2015: Received from: [facility]   EUTHYROX 75 MCG tablet TAKE 1 TABLET BY MOUTH ONCE DAILY BEFORE BREAKFAST    furosemide (LASIX) 20 MG tablet Take 20 mg by mouth daily.    memantine (NAMENDA) 10 MG tablet Take 1 tablet by mouth twice daily    metoprolol succinate (TOPROL-XL) 25 MG 24 hr tablet Take 1 tablet by mouth in the morning and 2 nightly    rosuvastatin (CRESTOR) 5 MG tablet TAKE 1 TABLET BY MOUTH AT BEDTIME    VITAMIN D, ERGOCALCIFEROL, PO Take 2,000 tablets by mouth daily.     No facility-administered encounter medications on file as of 10/18/2019.     Goals Addressed              This Visit's Progress     per patient's daughter"I think my mom needs more supervison during the day"" (pt-stated)        CARE PLAN ENTRY (see longitudinal plan of care for additional care plan information)  Current Barriers:   ADL IADL limitations  Clinical Social Work Clinical Goal(s):   Over the next  90 days, patient will work with SW to address concerns related to coordinating additional help in the home  Interventions:  Inter-disciplinary care team collaboration (see longitudinal plan of care)  Continued to discuss need for supervision during the day due to patient's condition progressing  Patient's daughter confirmed that she has spoken with the insurance company to confirm that patient needs custodial care, which private insurance does not cover  Patient's daughter discussed potential plan for patient's sister in law to provide care and supervision for patient daily  Patient's daughter verbalized having no additional community resource needs at this time  Discussed plans with patient's daughter for ongoing care management follow up and provided patient with direct contact information for care management team if needed in the future   Patient Self Care Activities:   Attends all scheduled provider appointments  Unable to perform ADLs independently  Unable to perform IADLs independently  Initial goal documentation         Follow Up Plan: Client will contact this social worker with any additonal community resource needs   [place], LCSW Clinical Social Worker  [facility] Care Management [phone]

## 2019-10-18 NOTE — Patient Instructions (Signed)
Thank you allowing the Chronic Care Management Team to be a part of your care! It was a pleasure speaking with you today!  1. Please call this social worker with any questions or concerns regarding your community resource needs  CCM (Chronic Care Management) Team   Juanell Fairly  RN, BSN Nurse  Care Coordinator  [phone]   Mihran Lebarron [address], LCSW Clinical Social Worker [phone]  Goals Addressed              This Visit's Progress   .  per patient's daughter"I think my mom needs more supervison during the day"" (pt-stated)        CARE PLAN ENTRY (see longitudinal plan of care for additional care plan information)  Current Barriers:  . ADL IADL limitations  Clinical Social Work Clinical Goal(s):  Marland Kitchen Over the next 90 days, patient will work with SW to address concerns related to coordinating additional help in the home  Interventions: . Inter-disciplinary care team collaboration (see longitudinal plan of care) . Continued to discuss need for supervision during the day due to patient's condition progressing . Patient's daughter confirmed that she has spoken with the insurance company to confirm that patient needs custodial care, which private insurance does not cover . Patient's daughter discussed potential plan for patient's sister in law to provide care and supervision for patient daily . Patient's daughter verbalized having no additional community resource needs at this time . Discussed plans with patient's daughter for ongoing care management follow up and provided patient with direct contact information for care management team if needed in the future   Patient Self Care Activities:  . Attends all scheduled provider appointments . Unable to perform ADLs independently . Unable to perform IADLs independently  Initial goal documentation         The patient verbalized understanding of instructions provided today and declined a print copy of patient  instruction materials.   No further follow up required: patient's daughter now looking into family members providing patient with daily care

## 2019-10-18 NOTE — Telephone Encounter (Signed)
This encounter was created in error - please disregard.

## 2019-10-27 ENCOUNTER — Telehealth: Payer: Medicare Other

## 2019-10-27 ENCOUNTER — Telehealth: Payer: Self-pay

## 2019-10-27 NOTE — Telephone Encounter (Signed)
°  Chronic Care Management   Outreach Note  10/27/2019 Name: [name] MRN: [id number] DOB: 03-26-1927  Primary Care Provider: Jamelle Haring, MD Reason for referral : Chronic Care Management    An unsuccessful telephone outreach was attempted today. [name] was referred to the case management team for assistance with care management and care coordination. Per chart review, she is currently engaged with the chronic care management team.  A HIPAA compliant voice message was left today requesting a return call.   Follow Up Plan: A member of the care management team will reach out to [name] again within the next two weeks.    [facility] Care Management [facility] [phone]

## 2019-11-29 ENCOUNTER — Other Ambulatory Visit: Payer: Self-pay | Admitting: Internal Medicine

## 2019-11-29 DIAGNOSIS — I10 Essential (primary) hypertension: Secondary | ICD-10-CM

## 2019-12-12 ENCOUNTER — Emergency Department: Payer: Medicare Other

## 2019-12-12 ENCOUNTER — Other Ambulatory Visit: Payer: Self-pay

## 2019-12-12 ENCOUNTER — Inpatient Hospital Stay
Admission: EM | Admit: 2019-12-12 | Discharge: 2019-12-18 | DRG: 481 | Disposition: A | Discharge status: Skilled Nursing Facility | Payer: Medicare Other | Attending: Internal Medicine | Admitting: Internal Medicine

## 2019-12-12 DIAGNOSIS — Z66 Do not resuscitate: Secondary | ICD-10-CM | POA: Diagnosis present

## 2019-12-12 DIAGNOSIS — E785 Hyperlipidemia, unspecified: Secondary | ICD-10-CM | POA: Diagnosis present

## 2019-12-12 DIAGNOSIS — S72141A Displaced intertrochanteric fracture of right femur, initial encounter for closed fracture: Principal | ICD-10-CM | POA: Diagnosis present

## 2019-12-12 DIAGNOSIS — E876 Hypokalemia: Secondary | ICD-10-CM | POA: Diagnosis present

## 2019-12-12 DIAGNOSIS — R339 Retention of urine, unspecified: Secondary | ICD-10-CM | POA: Diagnosis not present

## 2019-12-12 DIAGNOSIS — I5022 Chronic systolic (congestive) heart failure: Secondary | ICD-10-CM | POA: Diagnosis present

## 2019-12-12 DIAGNOSIS — D6489 Other specified anemias: Secondary | ICD-10-CM | POA: Diagnosis not present

## 2019-12-12 DIAGNOSIS — S72001S Fracture of unspecified part of neck of right femur, sequela: Secondary | ICD-10-CM | POA: Diagnosis not present

## 2019-12-12 DIAGNOSIS — Y92009 Unspecified place in unspecified non-institutional (private) residence as the place of occurrence of the external cause: Secondary | ICD-10-CM | POA: Diagnosis not present

## 2019-12-12 DIAGNOSIS — I351 Nonrheumatic aortic (valve) insufficiency: Secondary | ICD-10-CM | POA: Diagnosis present

## 2019-12-12 DIAGNOSIS — Z20822 Contact with and (suspected) exposure to covid-19: Secondary | ICD-10-CM | POA: Diagnosis present

## 2019-12-12 DIAGNOSIS — I110 Hypertensive heart disease with heart failure: Secondary | ICD-10-CM | POA: Diagnosis present

## 2019-12-12 DIAGNOSIS — I10 Essential (primary) hypertension: Secondary | ICD-10-CM | POA: Diagnosis not present

## 2019-12-12 DIAGNOSIS — S72001A Fracture of unspecified part of neck of right femur, initial encounter for closed fracture: Secondary | ICD-10-CM

## 2019-12-12 DIAGNOSIS — E039 Hypothyroidism, unspecified: Secondary | ICD-10-CM | POA: Diagnosis present

## 2019-12-12 DIAGNOSIS — F0280 Dementia in other diseases classified elsewhere without behavioral disturbance: Secondary | ICD-10-CM | POA: Diagnosis present

## 2019-12-12 DIAGNOSIS — Z23 Encounter for immunization: Secondary | ICD-10-CM | POA: Diagnosis not present

## 2019-12-12 DIAGNOSIS — Z7982 Long term (current) use of aspirin: Secondary | ICD-10-CM | POA: Diagnosis not present

## 2019-12-12 DIAGNOSIS — Z79899 Other long term (current) drug therapy: Secondary | ICD-10-CM | POA: Diagnosis not present

## 2019-12-12 DIAGNOSIS — W010XXA Fall on same level from slipping, tripping and stumbling without subsequent striking against object, initial encounter: Secondary | ICD-10-CM | POA: Diagnosis present

## 2019-12-12 DIAGNOSIS — G309 Alzheimer's disease, unspecified: Secondary | ICD-10-CM | POA: Diagnosis present

## 2019-12-12 DIAGNOSIS — I420 Dilated cardiomyopathy: Secondary | ICD-10-CM | POA: Diagnosis present

## 2019-12-12 DIAGNOSIS — Y92000 Kitchen of unspecified non-institutional (private) residence as  the place of occurrence of the external cause: Secondary | ICD-10-CM

## 2019-12-12 DIAGNOSIS — W19XXXS Unspecified fall, sequela: Secondary | ICD-10-CM | POA: Diagnosis not present

## 2019-12-12 DIAGNOSIS — M25551 Pain in right hip: Secondary | ICD-10-CM | POA: Diagnosis present

## 2019-12-12 DIAGNOSIS — W19XXXA Unspecified fall, initial encounter: Secondary | ICD-10-CM

## 2019-12-12 DIAGNOSIS — H409 Unspecified glaucoma: Secondary | ICD-10-CM | POA: Diagnosis present

## 2019-12-12 DIAGNOSIS — Z01818 Encounter for other preprocedural examination: Secondary | ICD-10-CM

## 2019-12-12 DIAGNOSIS — M810 Age-related osteoporosis without current pathological fracture: Secondary | ICD-10-CM | POA: Diagnosis present

## 2019-12-12 DIAGNOSIS — D649 Anemia, unspecified: Secondary | ICD-10-CM | POA: Diagnosis not present

## 2019-12-12 DIAGNOSIS — Z419 Encounter for procedure for purposes other than remedying health state, unspecified: Secondary | ICD-10-CM

## 2019-12-12 LAB — CBC WITH DIFFERENTIAL/PLATELET
Abs Immature Granulocytes: 0.05 10*3/uL (ref 0.00–0.07)
Basophils Absolute: 0.1 10*3/uL (ref 0.0–0.1)
Basophils Relative: 1 %
Eosinophils Absolute: 0.5 10*3/uL (ref 0.0–0.5)
Eosinophils Relative: 5 %
HCT: 36.6 % (ref 36.0–46.0)
Hemoglobin: 12.1 g/dL (ref 12.0–15.0)
Immature Granulocytes: 1 %
Lymphocytes Relative: 26 %
Lymphs Abs: 2.4 10*3/uL (ref 0.7–4.0)
MCH: 29.7 pg (ref 26.0–34.0)
MCHC: 33.1 g/dL (ref 30.0–36.0)
MCV: 89.7 fL (ref 80.0–100.0)
Monocytes Absolute: 0.9 10*3/uL (ref 0.1–1.0)
Monocytes Relative: 10 %
Neutro Abs: 5.5 10*3/uL (ref 1.7–7.7)
Neutrophils Relative %: 57 %
Platelets: 258 10*3/uL (ref 150–400)
RBC: 4.08 MIL/uL (ref 3.87–5.11)
RDW: 12.6 % (ref 11.5–15.5)
WBC: 9.4 10*3/uL (ref 4.0–10.5)
nRBC: 0 % (ref 0.0–0.2)

## 2019-12-12 LAB — BASIC METABOLIC PANEL
Anion gap: 10 (ref 5–15)
BUN: 13 mg/dL (ref 8–23)
CO2: 29 mmol/L (ref 22–32)
Calcium: 8.5 mg/dL — ABNORMAL LOW (ref 8.9–10.3)
Chloride: 101 mmol/L (ref 98–111)
Creatinine, Ser: 0.73 mg/dL (ref 0.44–1.00)
GFR calc Af Amer: >60 mL/min (ref >60)
GFR calc non Af Amer: >60 mL/min (ref >60)
Glucose, Bld: 143 mg/dL — ABNORMAL HIGH (ref 70–99)
Potassium: 2.9 mmol/L — ABNORMAL LOW (ref 3.5–5.1)
Sodium: 140 mmol/L (ref 135–145)

## 2019-12-12 LAB — RESPIRATORY PANEL BY RT PCR (FLU A&B, COVID)
Influenza A by PCR: NEGATIVE
Influenza B by PCR: NEGATIVE
SARS Coronavirus 2 by RT PCR: NEGATIVE

## 2019-12-12 MED ORDER — SODIUM CHLORIDE 0.9 % IV SOLN: INTRAVENOUS | Status: DC

## 2019-12-12 MED ORDER — POTASSIUM CHLORIDE 10 MEQ/100ML IV SOLN
10.0000 meq | INTRAVENOUS | Status: AC
  Administered 2019-12-13 (×3): 10 meq via INTRAVENOUS
  Filled 2019-12-12 (×3): qty 100

## 2019-12-12 MED ORDER — POTASSIUM CHLORIDE CRYS ER 20 MEQ PO TBCR
40.0000 meq | EXTENDED_RELEASE_TABLET | Freq: Once | ORAL | Status: AC
  Administered 2019-12-12: 40 meq via ORAL
  Filled 2019-12-12: qty 2

## 2019-12-12 MED ORDER — CEFAZOLIN SODIUM-DEXTROSE 1-4 GM/50ML-% IV SOLN
1.0000 g | Freq: Once | INTRAVENOUS | Status: AC
  Administered 2019-12-12: 1 g via INTRAVENOUS
  Filled 2019-12-12: qty 50

## 2019-12-12 MED ORDER — MORPHINE SULFATE (PF) 2 MG/ML IV SOLN
0.5000 mg | INTRAVENOUS | Status: DC | PRN
  Administered 2019-12-12 – 2019-12-13 (×2): 0.5 mg via INTRAVENOUS
  Filled 2019-12-12 (×2): qty 1

## 2019-12-12 MED ORDER — HYDROCODONE-ACETAMINOPHEN 5-325 MG PO TABS
1.0000 | ORAL_TABLET | Freq: Four times a day (QID) | ORAL | Status: DC | PRN
  Administered 2019-12-12: 1 via ORAL
  Administered 2019-12-15: 2 via ORAL
  Administered 2019-12-17: 1 via ORAL
  Filled 2019-12-12: qty 1
  Filled 2019-12-12: qty 2
  Filled 2019-12-12 (×2): qty 1

## 2019-12-12 NOTE — ED Notes (Signed)
Pharmacy messaged about missing dose of antibiotics.

## 2019-12-12 NOTE — ED Provider Notes (Signed)
[facility] Emergency Department Provider Note   ____________________________________________   I have reviewed the triage vital signs and the nursing notes.   HISTORY  Chief Complaint Fall   History limited by and level 5 caveat due to: Dementia  HPI [name] is a 86 y.o. female who presents to the emergency department today via EMS from home because of concern for an unwitnessed fall. Apparently the family heard the fall from another room. When fire department got there and went to stand her up she started having right hip and leg complaints. She was given pain medication by ems on transport. Patient herself cannot give any significant history. Does not complain of pain when I ask her if she hurts anywhere.   Records reviewed. Per medical record review patient has a history of dementia  Past Medical History:  Diagnosis Date  . Dementia (HCC)   . Glaucoma   . HTN (hypertension) 02/10/2017  . Osteoporosis   . Thyroid disease     Patient Active Problem List   Diagnosis Date Noted  . Chronic systolic heart failure (HCC) 10/12/2019  . At risk for fall due to comorbid condition 10/12/2019  . Senile purpura (HCC) 08/31/2019  . Osteopenia 06/22/2017  . HTN (hypertension) 02/10/2017  . Dementia of the Alzheimer's type without behavioral disturbance (HCC) 11/04/2015  . Gradual-onset memory impairment 10/25/2015  . Hypothyroidism 05/07/2015  . Hyperlipidemia 05/07/2015  . Hyperglycemia 05/07/2015  . Aortic insufficiency 06/14/2013    Past Surgical History:  Procedure Laterality Date  . NO PAST SURGERIES      Prior to Admission medications   Medication Sig Start Date End Date Taking? Authorizing Provider  bimatoprost (LUMIGAN) 0.01 % SOLN Place 1 drop into both eyes at bedtime.     [provider]  brinzolamide (AZOPT) 1 % ophthalmic suspension Place 1 drop into both eyes 2 (two) times daily.     [provider]  EUTHYROX  75 MCG tablet TAKE 1 TABLET BY MOUTH ONCE DAILY BEFORE BREAKFAST 06/19/19   Jamelle Haring, MD  furosemide (LASIX) 20 MG tablet Take 20 mg by mouth daily. 08/16/18   [provider]  memantine (NAMENDA) 10 MG tablet Take 1 tablet by mouth twice daily 07/23/19   Jamelle Haring, MD  metoprolol succinate (TOPROL-XL) 25 MG 24 hr tablet TAKE 1 TABLET BY MOUTH IN THE MORNING AND 2 NIGHTLY 11/30/19   Jamelle Haring, MD  rosuvastatin (CRESTOR) 5 MG tablet TAKE 1 TABLET BY MOUTH AT BEDTIME 06/19/19   Jamelle Haring, MD  VITAMIN D, ERGOCALCIFEROL, PO Take 2,000 tablets by mouth daily.     [provider]    Allergies Patient has no known allergies.  Family History  Problem Relation Age of Onset  . Cancer Father   . Hyperlipidemia Sister   . Heart disease Brother   . Stroke Neg Hx     Social History Social History   Tobacco Use  . Smoking status: Never Smoker  . Smokeless tobacco: Never Used  . Tobacco comment: smoking cessation materials not required  Vaping Use  . Vaping Use: Never used  Substance Use Topics  . Alcohol use: No    Alcohol/week: 0.0 standard drinks  . Drug use: No    Review of Systems Unable to obtain secondary to dementia.  ____________________________________________   PHYSICAL EXAM:  VITAL SIGNS: ED Triage Vitals [12/12/19 2035]  Enc Vitals Group     BP  Pulse      Resp      Temp      Temp src      SpO2      Weight 134 lb 11.2 oz (61.1 kg)     Height 5\' 5"  (1.651 m)     Head Circumference      Peak Flow      Pain Score      Pain Loc      Pain Edu?      Excl. in GC?      Constitutional: Awake and alert. Not completely oriented.  Eyes: Conjunctivae are normal.  ENT      Head: Normocephalic and atraumatic.      Nose: No congestion/rhinnorhea.      Mouth/Throat: Mucous membranes are moist.      Neck: No stridor. Hematological/Lymphatic/Immunilogical: No cervical lymphadenopathy. Cardiovascular:  Normal rate, regular rhythm.  No murmurs, rubs, or gallops.  Respiratory: Normal respiratory effort without tachypnea nor retractions. Breath sounds are clear and equal bilaterally. No wheezes/rales/rhonchi. Gastrointestinal: Soft and non tender. No rebound. No guarding.  Genitourinary: Deferred Musculoskeletal: Right leg externally rotated, shortened. Tender to manipulation. NV intact distally. Neurologic: Dementia. Sensation grossly intact. Able to move all digits.  Skin:  Skin is warm, dry and intact. No rash noted. ____________________________________________    LABS (pertinent positives/negatives)  BMP na 140, k 2.9, glu 143, ca 8.5 CBC wbc 9.4, hgb 12.1, plt 258  ____________________________________________   EKG  I, , attending physician, personally viewed and interpreted this EKG  EKG Time: 2145 Rate: 56 Rhythm: sinus bradycardia with pvc Axis: normal Intervals: qtc 434 QRS: narrow, q waves v1, LVH ST changes: no st elevation Impression: abnormal ekg ____________________________________________    RADIOLOGY  Right hip Intertrochanteric fracture  CXR Pulmonary fibrosis  CT head/cervical spine No acute traumatic finding  ____________________________________________   PROCEDURES  Procedures  ____________________________________________   INITIAL IMPRESSION / ASSESSMENT AND PLAN / ED COURSE  Pertinent labs & imaging results that were available during my care of the patient were reviewed by me and considered in my medical decision making (see chart for details).   Patient presented to the emergency department today after a fall. Concern for right hip fracture after exam. X-ray does show right hip fracture. Discussed with Dr. 2146 with surgery. Will plan on admission to the hospitalist service. Discussed findings and plan with daughter.   ____________________________________________   FINAL CLINICAL IMPRESSION(S) / ED DIAGNOSES  Final  diagnoses:  Closed fracture of right hip, initial encounter [facility])  Fall, initial encounter     Note: This dictation was prepared with [facility] dictation. Any transcriptional errors that result from this process are unintentional     Nurse, children's, MD 12/12/19 2159

## 2019-12-12 NOTE — ED Notes (Signed)
Pt returned from CT. Daughter bedside.

## 2019-12-12 NOTE — Progress Notes (Addendum)
Full consult note and discussion with patient & family to follow tomorrow AM. I  Called by ED staff. Imaging reviewed.  - Plan for surgery tomorrow, likely afternoon.  -  I have called the patient's son and discussed the findings and proposed treatment plan as well as risks, benefits, and alternatives to surgery. He is in agreement to proceed with surgery.  - NPO after midnight - Hold anticoagulation - Admit to Hospitalist team. Patient was evaluated by Dr. Darrold Junker with [facility] earlier today and no further intervention was necessary with last Echo performed in Jan 2021. I have discussed this with the Anesthesiology team on call currently. No formal cardiology consult would be required unless she had an acute change concerning for cardiac symptoms or lab abnormalities necessitating Cardiology consult.

## 2019-12-12 NOTE — ED Triage Notes (Addendum)
Pt from home via [facility]. C/o unwitnessed fall. Per EMS, family "heard a thump and she was on the ground". No LOC, denies hitting head. Pt c/o right hip pain, per EMS R anterior dislocation. Pt given 25 mcg fentanyl by EMS. Pt has hx HTN, takes aspirin daily. Hx of dementia, GCS 14 at baseline per EMS.

## 2019-12-12 NOTE — H&P (Signed)
History and Physical    [name]:[id number] DOB: 1927-11-11 DOA: 12/12/2019  PCP: Jamelle Haring, MD   Patient coming from: home  I have personally briefly reviewed patient's old medical records in [facility]  Chief Complaint: Fall  HPI: [name] is a 86 y.o. female with medical history significant for dementia, chronic systolic heart failure, with dilated cardiomyopathy, last EF 25 to 30% 04/01/2018, nonrheumatic aortic insufficiency, HTN, HLD who suffered an unwitnessed fall in her home.  According to granddaughter, she heard a thud in the kitchen and went and saw her on the floor.  She stated that a chair was out of place so it appeared that she tripped over the chair.  The fire department came to help her up and assisted her up she appeared to have pain on the right hip and they brought her to the emergency room for evaluation.  History is limited due to Alzheimer's dementia. ED Course: On arrival, she was awake and alert.  Blood work significant for hypokalemia of 2.9 otherwise unremarkable.  Extensive imaging including CT head, CT C-spine, chest x-ray and right hip x-ray significant for comminuted intertrochanteric fracture of the proximal right femur.  The emergency room provider spoke with orthopedist, Dr. Signa Kell who would like to take patient to the OR in the afternoon of 12/13/2019 EKG as reviewed by me : Sinus bradycardia 56 with no acute ST-T wave changes  Review of Systems: Unable to obtain due to dementia   Past Medical History:  Diagnosis Date  . Dementia (HCC)   . Glaucoma   . HTN (hypertension) 02/10/2017  . Osteoporosis   . Thyroid disease     Past Surgical History:  Procedure Laterality Date  . NO PAST SURGERIES       reports that she has never smoked. She has never used smokeless tobacco. She reports that she does not drink alcohol and does not use drugs.  No Known Allergies  Family History  Problem Relation Age of  Onset  . Cancer Father   . Hyperlipidemia Sister   . Heart disease Brother   . Stroke Neg Hx       Prior to Admission medications   Medication Sig Start Date End Date Taking? Authorizing Provider  bimatoprost (LUMIGAN) 0.01 % SOLN Place 1 drop into both eyes at bedtime.     [provider]  brinzolamide (AZOPT) 1 % ophthalmic suspension Place 1 drop into both eyes 2 (two) times daily.     [provider]  EUTHYROX 75 MCG tablet TAKE 1 TABLET BY MOUTH ONCE DAILY BEFORE BREAKFAST 06/19/19   Jamelle Haring, MD  furosemide (LASIX) 20 MG tablet Take 20 mg by mouth daily. 08/16/18   [provider]  memantine (NAMENDA) 10 MG tablet Take 1 tablet by mouth twice daily 07/23/19   Jamelle Haring, MD  metoprolol succinate (TOPROL-XL) 25 MG 24 hr tablet TAKE 1 TABLET BY MOUTH IN THE MORNING AND 2 NIGHTLY 11/30/19   Jamelle Haring, MD  rosuvastatin (CRESTOR) 5 MG tablet TAKE 1 TABLET BY MOUTH AT BEDTIME 06/19/19   Jamelle Haring, MD  VITAMIN D, ERGOCALCIFEROL, PO Take 2,000 tablets by mouth daily.     [provider]    Physical Exam: Vitals:   12/12/19 2035  Weight: 61.1 kg  Height: 5\' 5"  (1.651 m)     Vitals:   12/12/19 2035  Weight: 61.1 kg  Height: 5\' 5"  (1.651 m)  Constitutional: Alert and oriented to person only . Not in any apparent distress HEENT:      Head: Normocephalic and atraumatic.         Eyes: PERLA, EOMI, Conjunctivae are normal. Sclera is non-icteric.       Mouth/Throat: Mucous membranes are moist.       Neck: Supple with no signs of meningismus. Cardiovascular: Regular rate and rhythm. No murmurs, gallops, or rubs. 2+ symmetrical distal pulses are present . No JVD. No LE edema Respiratory: Respiratory effort normal .Lungs sounds clear bilaterally. No wheezes, crackles, or rhonchi.  Gastrointestinal: Soft, non tender, and non distended with positive bowel sounds. No rebound or guarding. Genitourinary:  No CVA tenderness. Musculoskeletal:Right leg externally rotated, shortened. Tender to manipulation.  Neurovascularly intact. . No cyanosis, or erythema of extremities. Neurologic:  Face is symmetric. Moving all extremities. No gross focal neurologic deficits . Skin: Skin is warm, dry.  No rash or ulcers Psychiatric: Mood normal.  No behavioral disturbance.   Labs on Admission: I have personally reviewed following labs and imaging studies  CBC: Recent Labs  Lab 12/12/19 2045  WBC 9.4  NEUTROABS 5.5  HGB 12.1  HCT 36.6  MCV 89.7  PLT 258   Basic Metabolic Panel: Recent Labs  Lab 12/12/19 2045  NA 140  K 2.9*  CL 101  CO2 29  GLUCOSE 143*  BUN 13  CREATININE 0.73  CALCIUM 8.5*   GFR: Estimated Creatinine Clearance: 42.9 mL/min (by C-G formula based on SCr of 0.73 mg/dL). Liver Function Tests: No results for input(s): AST, ALT, ALKPHOS, BILITOT, PROT, ALBUMIN in the last 168 hours. No results for input(s): LIPASE, AMYLASE in the last 168 hours. No results for input(s): AMMONIA in the last 168 hours. Coagulation Profile: No results for input(s): INR, PROTIME in the last 168 hours. Cardiac Enzymes: No results for input(s): CKTOTAL, CKMB, CKMBINDEX, TROPONINI in the last 168 hours. BNP (last 3 results) No results for input(s): PROBNP in the last 8760 hours. HbA1C: No results for input(s): HGBA1C in the last 72 hours. CBG: No results for input(s): GLUCAP in the last 168 hours. Lipid Profile: No results for input(s): CHOL, HDL, LDLCALC, TRIG, CHOLHDL, LDLDIRECT in the last 72 hours. Thyroid Function Tests: No results for input(s): TSH, T4TOTAL, FREET4, T3FREE, THYROIDAB in the last 72 hours. Anemia Panel: No results for input(s): VITAMINB12, FOLATE, FERRITIN, TIBC, IRON, RETICCTPCT in the last 72 hours. Urine analysis:    Component Value Date/Time   COLORURINE Yellow 12/23/2011 1524   APPEARANCEUR Clear 12/23/2011 1524   LABSPEC 1.013 12/23/2011 1524   PHURINE 5.0  12/23/2011 1524   GLUCOSEU Negative 12/23/2011 1524   HGBUR Negative 12/23/2011 1524   BILIRUBINUR Negative 12/23/2011 1524   KETONESUR Negative 12/23/2011 1524   PROTEINUR Negative 12/23/2011 1524   NITRITE Negative 12/23/2011 1524   LEUKOCYTESUR 1+ 12/23/2011 1524    Radiological Exams on Admission: DG Chest 1 View  Result Date: 12/12/2019 CLINICAL DATA:  Fall EXAM: CHEST  1 VIEW COMPARISON:  None. FINDINGS: Diffuse bilateral reticular opacity greatest at the bases and periphery suggesting pulmonary fibrosis. No acute consolidation, pleural effusion or pneumothorax. Cardiac size within normal limits. Retrocardiac opacity, question hiatal hernia. Mild convex right paratracheal opacity. No pneumothorax. Possible left mid clavicle deformity but limited evaluation secondary to osseous overlap. IMPRESSION: 1. Findings suspicious for pulmonary fibrosis. No acute infiltrate, edema or pneumothorax. 2. Possible fracture deformity of the mid left clavicle, age indeterminate. Consider dedicated left clavicle views if painful here. 3.  Possible hiatal hernia. 4. Slight convex opacity in the right paratracheal region, likely augmented by rotation and position, recommend dedicated two view chest to assess for persistence. Electronically Signed   By: Jasmine PangKim  Fujinaga M.D.   On: 12/12/2019 21:23   CT Head Wo Contrast  Result Date: 12/12/2019 CLINICAL DATA:  Unwitnessed fall. EXAM: CT HEAD WITHOUT CONTRAST TECHNIQUE: Contiguous axial images were obtained from the base of the skull through the vertex without intravenous contrast. COMPARISON:  December 23, 2011 FINDINGS: Brain: There is mild cerebral atrophy with widening of the extra-axial spaces and ventricular dilatation. There are areas of decreased attenuation within the white matter tracts of the supratentorial brain, consistent with microvascular disease changes. Vascular: No hyperdense vessel or unexpected calcification. Skull: Normal. Negative for fracture or  focal lesion. Sinuses/Orbits: No acute finding. Other: None. IMPRESSION: 1. Generalized cerebral atrophy. 2. No acute intracranial abnormality. Electronically Signed   By: Aram Candelahaddeus  Houston M.D.   On: 12/12/2019 21:32   CT Cervical Spine Wo Contrast  Result Date: 12/12/2019 CLINICAL DATA:  Status post fall. EXAM: CT CERVICAL SPINE WITHOUT CONTRAST TECHNIQUE: Multidetector CT imaging of the cervical spine was performed without intravenous contrast. Multiplanar CT image reconstructions were also generated. COMPARISON:  None. FINDINGS: Alignment: Normal. Skull base and vertebrae: No acute fracture. No primary bone lesion or focal pathologic process. Soft tissues and spinal canal: No prevertebral fluid or swelling. No visible canal hematoma. Disc levels: Mild endplate sclerosis is seen at the level of C5-C6. Mild to moderate severity intervertebral disc space narrowing is also seen at this level. Mild intervertebral disc space narrowing is noted throughout the remainder of the cervical spine. Mild to moderate severity bilateral multilevel facet joint hypertrophy is noted. Upper chest: Negative. Other: None. IMPRESSION: 1. Mild to moderate severity degenerative changes of the cervical spine, most prominent at the level of C5-C6. 2. No evidence of an acute fracture or subluxation. Electronically Signed   By: Aram Candelahaddeus  Houston M.D.   On: 12/12/2019 21:35   DG Hip Unilat W or Wo Pelvis 2-3 Views Right  Result Date: 12/12/2019 CLINICAL DATA:  Fall EXAM: DG HIP (WITH OR WITHOUT PELVIS) 2-3V RIGHT COMPARISON:  None. FINDINGS: There is a comminuted intertrochanteric fracture of the proximal right femur. No dislocation of the femoral head. No other pelvic fracture. IMPRESSION: Comminuted intertrochanteric fracture of the proximal right femur. Electronically Signed   By: Deatra RobinsonKevin  Herman M.D.   On: 12/12/2019 21:21     Assessment/Plan 86 year old female with history of dementia, chronic systolic heart failure, with  dilated cardiomyopathy, last EF 25 to 30% 04/01/2018, nonrheumatic aortic insufficiency, HTN, HLD presenting following an unwitnessed fall in her home sustaining right hip fracture.    Closed right hip fracture (HCC)   Fall at home, initial encounter   Preoperative clearance -Fall was unwitnessed, but suspect mechanical.  Patient was seen earlier in the day by her cardiologist, per review of chart and was in usual state of health with no complaints -Head CT with no acute findings and trauma work-up negative except for hip fracture -X-ray hip showing intertrochanteric comminuted fracture of the right hip -Seen on 9/28 by cardiologist, Dr. Darrold JunkerParaschos who recommended follow-up options including nuclear stress test for patient's EF 25 to 30% but family opted for conservative management -Cardiology consulted for further clearance of necessary -Last echocardiogram was 04/01/2018 that showed EF 25 to 30% -Echo ordered -Orthopedic repair planned for the afternoon of 9/29 -N.p.o. after midnight and further orders per Ortho  Chronic systolic heart failure (HCC)   Aortic insufficiency   Dilated cardiomyopathy (HCC) -Last echocardiogram was 04/01/2018 that showed EF 25 to 30% -Cardiology consult for preoperative clearance in view of low EF the patient shows no signs at this time of acute decompensation -Continue metoprolol -Monitor for overload as patient will be on gentle IV hydration in view of n.p.o. status  Hypokalemia -Potassium 2.9.  IV supplementation    Hypothyroidism -Continue levothyroxine    Dementia of the Alzheimer's type without behavioral disturbance (HCC) -Can hold Namenda until postop    HTN (hypertension) -Continue metoprolol  Glaucoma -Continue eyedrops    DVT prophylaxis: SCDs  code Status: DNR, as discussed with daughter Family Communication: Daughter Jackelyn Knife at bedside, and granddaughter Disposition Plan: Back to previous home environment Consults called:  Orthopedics, cardiology Status: Inpatient due to inpatient only procedure    Andris Baumann MD [facility]     12/12/2019, 10:03 PM

## 2019-12-12 NOTE — ED Notes (Signed)
Patient transported to CT 

## 2019-12-13 ENCOUNTER — Other Ambulatory Visit: Payer: Self-pay

## 2019-12-13 ENCOUNTER — Inpatient Hospital Stay: Payer: Medicare Other | Admitting: Certified Registered"

## 2019-12-13 ENCOUNTER — Encounter
Admission: EM | Disposition: A | Discharge status: Skilled Nursing Facility | Payer: Self-pay | Source: Home / Self Care | Attending: Internal Medicine

## 2019-12-13 ENCOUNTER — Inpatient Hospital Stay: Payer: Medicare Other

## 2019-12-13 ENCOUNTER — Encounter: Payer: Self-pay | Admitting: Internal Medicine

## 2019-12-13 DIAGNOSIS — F0280 Dementia in other diseases classified elsewhere without behavioral disturbance: Secondary | ICD-10-CM

## 2019-12-13 DIAGNOSIS — I10 Essential (primary) hypertension: Secondary | ICD-10-CM

## 2019-12-13 DIAGNOSIS — G309 Alzheimer's disease, unspecified: Secondary | ICD-10-CM

## 2019-12-13 DIAGNOSIS — E039 Hypothyroidism, unspecified: Secondary | ICD-10-CM

## 2019-12-13 DIAGNOSIS — I5022 Chronic systolic (congestive) heart failure: Secondary | ICD-10-CM

## 2019-12-13 DIAGNOSIS — S72001S Fracture of unspecified part of neck of right femur, sequela: Secondary | ICD-10-CM

## 2019-12-13 HISTORY — PX: INTRAMEDULLARY (IM) NAIL INTERTROCHANTERIC: SHX5875

## 2019-12-13 LAB — SURGICAL PCR SCREEN
MRSA, PCR: NEGATIVE
Staphylococcus aureus: NEGATIVE

## 2019-12-13 LAB — BASIC METABOLIC PANEL
Anion gap: 8 (ref 5–15)
BUN: 12 mg/dL (ref 8–23)
CO2: 30 mmol/L (ref 22–32)
Calcium: 8.4 mg/dL — ABNORMAL LOW (ref 8.9–10.3)
Chloride: 102 mmol/L (ref 98–111)
Creatinine, Ser: 0.66 mg/dL (ref 0.44–1.00)
GFR calc Af Amer: >60 mL/min (ref >60)
GFR calc non Af Amer: >60 mL/min (ref >60)
Glucose, Bld: 166 mg/dL — ABNORMAL HIGH (ref 70–99)
Potassium: 3.6 mmol/L (ref 3.5–5.1)
Sodium: 140 mmol/L (ref 135–145)

## 2019-12-13 LAB — CBC
HCT: 32.2 % — ABNORMAL LOW (ref 36.0–46.0)
Hemoglobin: 10.6 g/dL — ABNORMAL LOW (ref 12.0–15.0)
MCH: 30.1 pg (ref 26.0–34.0)
MCHC: 32.9 g/dL (ref 30.0–36.0)
MCV: 91.5 fL (ref 80.0–100.0)
Platelets: 220 10*3/uL (ref 150–400)
RBC: 3.52 MIL/uL — ABNORMAL LOW (ref 3.87–5.11)
RDW: 12.7 % (ref 11.5–15.5)
WBC: 10.5 10*3/uL (ref 4.0–10.5)
nRBC: 0 % (ref 0.0–0.2)

## 2019-12-13 LAB — PROTIME-INR
INR: 0.9 (ref 0.8–1.2)
Prothrombin Time: 11.7 seconds (ref 11.4–15.2)

## 2019-12-13 SURGERY — FIXATION, FRACTURE, INTERTROCHANTERIC, WITH INTRAMEDULLARY ROD
Anesthesia: Spinal | Site: Hip | Laterality: Right

## 2019-12-13 MED ORDER — METOPROLOL SUCCINATE ER 25 MG PO TB24
25.0000 mg | ORAL_TABLET | Freq: Every day | ORAL | Status: DC
  Administered 2019-12-14 – 2019-12-18 (×4): 25 mg via ORAL
  Filled 2019-12-13 (×5): qty 1

## 2019-12-13 MED ORDER — ADULT MULTIVITAMIN W/MINERALS CH
1.0000 | ORAL_TABLET | Freq: Every day | ORAL | Status: DC
  Administered 2019-12-14 – 2019-12-18 (×5): 1 via ORAL
  Filled 2019-12-13 (×5): qty 1

## 2019-12-13 MED ORDER — BUPIVACAINE HCL (PF) 0.5 % IJ SOLN
INTRAMUSCULAR | Status: AC
  Filled 2019-12-13: qty 30

## 2019-12-13 MED ORDER — CALCIUM CARBONATE-VITAMIN D 500-200 MG-UNIT PO TABS
1.0000 | ORAL_TABLET | Freq: Every day | ORAL | Status: DC
  Administered 2019-12-13 – 2019-12-18 (×5): 1 via ORAL
  Filled 2019-12-13 (×6): qty 1

## 2019-12-13 MED ORDER — PROPOFOL 10 MG/ML IV BOLUS
INTRAVENOUS | Status: AC
  Filled 2019-12-13: qty 20

## 2019-12-13 MED ORDER — PROPOFOL 500 MG/50ML IV EMUL
INTRAVENOUS | Status: DC | PRN
  Administered 2019-12-13: 20 ug/kg/min via INTRAVENOUS

## 2019-12-13 MED ORDER — METOPROLOL SUCCINATE ER 50 MG PO TB24
50.0000 mg | ORAL_TABLET | Freq: Every day | ORAL | Status: DC
  Administered 2019-12-13 – 2019-12-17 (×3): 50 mg via ORAL
  Filled 2019-12-13 (×3): qty 1

## 2019-12-13 MED ORDER — KETAMINE HCL 10 MG/ML IJ SOLN
INTRAMUSCULAR | Status: DC | PRN
  Administered 2019-12-13: 25 mg via INTRAVENOUS

## 2019-12-13 MED ORDER — FUROSEMIDE 20 MG PO TABS
20.0000 mg | ORAL_TABLET | Freq: Every day | ORAL | Status: DC
  Administered 2019-12-13 – 2019-12-18 (×6): 20 mg via ORAL
  Filled 2019-12-13 (×6): qty 1

## 2019-12-13 MED ORDER — CHLORHEXIDINE GLUCONATE CLOTH 2 % EX PADS
6.0000 | MEDICATED_PAD | Freq: Every day | CUTANEOUS | Status: DC
  Administered 2019-12-13 – 2019-12-18 (×3): 6 via TOPICAL

## 2019-12-13 MED ORDER — CEFAZOLIN SODIUM-DEXTROSE 1-4 GM/50ML-% IV SOLN
INTRAVENOUS | Status: AC
  Filled 2019-12-13: qty 50

## 2019-12-13 MED ORDER — METOPROLOL SUCCINATE ER 25 MG PO TB24: 25.0000 mg | ORAL_TABLET | Freq: Two times a day (BID) | ORAL | Status: DC

## 2019-12-13 MED ORDER — CEFAZOLIN SODIUM-DEXTROSE 2-4 GM/100ML-% IV SOLN
2.0000 g | Freq: Four times a day (QID) | INTRAVENOUS | Status: AC
  Administered 2019-12-13 – 2019-12-14 (×3): 2 g via INTRAVENOUS
  Filled 2019-12-13 (×3): qty 100

## 2019-12-13 MED ORDER — MAGNESIUM HYDROXIDE 400 MG/5ML PO SUSP: 30.0000 mL | Freq: Every day | ORAL | Status: DC | PRN

## 2019-12-13 MED ORDER — METOCLOPRAMIDE HCL 5 MG/ML IJ SOLN: 5.0000 mg | Freq: Three times a day (TID) | INTRAMUSCULAR | Status: DC | PRN

## 2019-12-13 MED ORDER — PROPOFOL 10 MG/ML IV BOLUS
INTRAVENOUS | Status: DC | PRN
  Administered 2019-12-13: 10 mg via INTRAVENOUS

## 2019-12-13 MED ORDER — FLEET ENEMA 7-19 GM/118ML RE ENEM: 1.0000 | ENEMA | Freq: Once | RECTAL | Status: DC | PRN

## 2019-12-13 MED ORDER — ONDANSETRON HCL 4 MG PO TABS: 4.0000 mg | ORAL_TABLET | Freq: Four times a day (QID) | ORAL | Status: DC | PRN

## 2019-12-13 MED ORDER — CEFAZOLIN SODIUM-DEXTROSE 1-4 GM/50ML-% IV SOLN
INTRAVENOUS | Status: DC | PRN
  Administered 2019-12-13: 1 g via INTRAVENOUS

## 2019-12-13 MED ORDER — PHENYLEPHRINE HCL (PRESSORS) 10 MG/ML IV SOLN
INTRAVENOUS | Status: DC | PRN
  Administered 2019-12-13 (×2): 100 ug via INTRAVENOUS

## 2019-12-13 MED ORDER — FENTANYL CITRATE (PF) 100 MCG/2ML IJ SOLN
INTRAMUSCULAR | Status: DC | PRN
Start: 2019-12-13 — End: 2019-12-13
  Administered 2019-12-13: 25 ug via INTRAVENOUS

## 2019-12-13 MED ORDER — BISACODYL 10 MG RE SUPP: 10.0000 mg | Freq: Every day | RECTAL | Status: DC | PRN

## 2019-12-13 MED ORDER — METOCLOPRAMIDE HCL 10 MG PO TABS: 5.0000 mg | ORAL_TABLET | Freq: Three times a day (TID) | ORAL | Status: DC | PRN

## 2019-12-13 MED ORDER — ONDANSETRON HCL 4 MG/2ML IJ SOLN: 4.0000 mg | Freq: Once | INTRAMUSCULAR | Status: DC | PRN

## 2019-12-13 MED ORDER — ACETAMINOPHEN 500 MG PO TABS
500.0000 mg | ORAL_TABLET | Freq: Four times a day (QID) | ORAL | Status: AC
  Administered 2019-12-13 – 2019-12-14 (×3): 500 mg via ORAL
  Filled 2019-12-13 (×4): qty 1

## 2019-12-13 MED ORDER — BUPIVACAINE-EPINEPHRINE (PF) 0.5% -1:200000 IJ SOLN
INTRAMUSCULAR | Status: DC | PRN
  Administered 2019-12-13: 30 mL via PERINEURAL

## 2019-12-13 MED ORDER — BUPIVACAINE HCL (PF) 0.5 % IJ SOLN
INTRAMUSCULAR | Status: DC | PRN
  Administered 2019-12-13: 2.5 mL

## 2019-12-13 MED ORDER — LEVOTHYROXINE SODIUM 75 MCG PO TABS
75.0000 ug | ORAL_TABLET | Freq: Every day | ORAL | Status: DC
  Administered 2019-12-14 – 2019-12-18 (×5): 75 ug via ORAL
  Filled 2019-12-13 (×3): qty 3
  Filled 2019-12-13 (×5): qty 1
  Filled 2019-12-13: qty 3
  Filled 2019-12-13: qty 1

## 2019-12-13 MED ORDER — BOOST / RESOURCE BREEZE PO LIQD CUSTOM
1.0000 | Freq: Three times a day (TID) | ORAL | Status: DC
  Administered 2019-12-13 – 2019-12-18 (×12): 1 via ORAL

## 2019-12-13 MED ORDER — DOCUSATE SODIUM 100 MG PO CAPS
100.0000 mg | ORAL_CAPSULE | Freq: Two times a day (BID) | ORAL | Status: DC
  Administered 2019-12-13 – 2019-12-18 (×8): 100 mg via ORAL
  Filled 2019-12-13 (×8): qty 1

## 2019-12-13 MED ORDER — TRAMADOL HCL 50 MG PO TABS
50.0000 mg | ORAL_TABLET | Freq: Four times a day (QID) | ORAL | Status: DC | PRN
  Administered 2019-12-13 – 2019-12-17 (×8): 50 mg via ORAL
  Filled 2019-12-13 (×9): qty 1

## 2019-12-13 MED ORDER — GLYCOPYRROLATE 0.2 MG/ML IJ SOLN
INTRAMUSCULAR | Status: DC | PRN
  Administered 2019-12-13: .2 mg via INTRAVENOUS

## 2019-12-13 MED ORDER — ONDANSETRON HCL 4 MG/2ML IJ SOLN: 4.0000 mg | Freq: Four times a day (QID) | INTRAMUSCULAR | Status: DC | PRN

## 2019-12-13 MED ORDER — ENOXAPARIN SODIUM 40 MG/0.4ML ~~LOC~~ SOLN
40.0000 mg | SUBCUTANEOUS | Status: DC
  Administered 2019-12-14 – 2019-12-18 (×5): 40 mg via SUBCUTANEOUS
  Filled 2019-12-13 (×5): qty 0.4

## 2019-12-13 MED ORDER — ACETAMINOPHEN 325 MG PO TABS
325.0000 mg | ORAL_TABLET | Freq: Four times a day (QID) | ORAL | Status: DC | PRN
  Administered 2019-12-17: 650 mg via ORAL
  Filled 2019-12-13: qty 2

## 2019-12-13 MED ORDER — KETAMINE HCL 50 MG/ML IJ SOLN
INTRAMUSCULAR | Status: AC
  Filled 2019-12-13: qty 10

## 2019-12-13 MED ORDER — MEMANTINE HCL 10 MG PO TABS
10.0000 mg | ORAL_TABLET | Freq: Two times a day (BID) | ORAL | Status: DC
  Administered 2019-12-13 – 2019-12-18 (×10): 10 mg via ORAL
  Filled 2019-12-13 (×4): qty 1
  Filled 2019-12-13: qty 2
  Filled 2019-12-13 (×2): qty 1
  Filled 2019-12-13 (×3): qty 2
  Filled 2019-12-13: qty 1
  Filled 2019-12-13: qty 2
  Filled 2019-12-13 (×3): qty 1
  Filled 2019-12-13 (×2): qty 2
  Filled 2019-12-13 (×2): qty 1
  Filled 2019-12-13 (×2): qty 2

## 2019-12-13 MED ORDER — FENTANYL CITRATE (PF) 100 MCG/2ML IJ SOLN: 25.0000 ug | INTRAMUSCULAR | Status: DC | PRN

## 2019-12-13 MED ORDER — DIPHENHYDRAMINE HCL 12.5 MG/5ML PO ELIX
12.5000 mg | ORAL_SOLUTION | ORAL | Status: DC | PRN
  Administered 2019-12-14 – 2019-12-17 (×3): 25 mg via ORAL
  Filled 2019-12-13 (×3): qty 10

## 2019-12-13 MED ORDER — FENTANYL CITRATE (PF) 100 MCG/2ML IJ SOLN
INTRAMUSCULAR | Status: AC
  Filled 2019-12-13: qty 2

## 2019-12-13 SURGICAL SUPPLY — 45 items
BIT DRILL 4.3MMS DISTAL GRDTED (BIT) ×1 IMPLANT
BNDG COHESIVE 4X5 TAN STRL (GAUZE/BANDAGES/DRESSINGS) ×3 IMPLANT
BNDG COHESIVE 6X5 TAN STRL LF (GAUZE/BANDAGES/DRESSINGS) ×3 IMPLANT
CANISTER SUCT 1200ML W/VALVE (MISCELLANEOUS) ×3 IMPLANT
CHLORAPREP W/TINT 26 (MISCELLANEOUS) ×6 IMPLANT
CORTICAL BONE SCR 5.0MM X 46MM (Screw) ×3 IMPLANT
COVER WAND RF STERILE (DRAPES) ×3 IMPLANT
DRAPE 3/4 80X56 (DRAPES) ×3 IMPLANT
DRAPE C-ARMOR (DRAPES) ×3 IMPLANT
DRILL 4.3MMS DISTAL GRADUATED (BIT) ×3
DRSG MEPILEX SACRM 8.7X9.8 (GAUZE/BANDAGES/DRESSINGS) ×3 IMPLANT
DRSG OPSITE POSTOP 3X4 (GAUZE/BANDAGES/DRESSINGS) ×6 IMPLANT
DRSG OPSITE POSTOP 4X6 (GAUZE/BANDAGES/DRESSINGS) ×3 IMPLANT
ELECT CAUTERY BLADE 6.4 (BLADE) ×3 IMPLANT
ELECT REM PT RETURN 9FT ADLT (ELECTROSURGICAL) ×3
ELECTRODE REM PT RTRN 9FT ADLT (ELECTROSURGICAL) ×1 IMPLANT
GAUZE SPONGE 4X4 12PLY STRL (GAUZE/BANDAGES/DRESSINGS) ×3 IMPLANT
GLOVE BIO SURGEON STRL SZ8 (GLOVE) ×6 IMPLANT
GLOVE INDICATOR 8.0 STRL GRN (GLOVE) ×3 IMPLANT
GOWN STRL REUS W/ TWL LRG LVL3 (GOWN DISPOSABLE) ×1 IMPLANT
GOWN STRL REUS W/ TWL XL LVL3 (GOWN DISPOSABLE) ×1 IMPLANT
GOWN STRL REUS W/TWL LRG LVL3 (GOWN DISPOSABLE) ×2
GOWN STRL REUS W/TWL XL LVL3 (GOWN DISPOSABLE) ×2
GUIDEWIRE BALL NOSE 100CM (WIRE) ×3 IMPLANT
HIP FRAC NAIL LAG SCR 10.5X100 (Orthopedic Implant) ×2 IMPLANT
MAT ABSORB  FLUID 56X50 GRAY (MISCELLANEOUS) ×2
MAT ABSORB FLUID 56X50 GRAY (MISCELLANEOUS) ×1 IMPLANT
NAIL HIP FRAC RT 130 11MX400M (Nail) ×3 IMPLANT
NEEDLE FILTER BLUNT 18X 1/2SAF (NEEDLE) ×2
NEEDLE FILTER BLUNT 18X1 1/2 (NEEDLE) ×1 IMPLANT
NEEDLE HYPO 22GX1.5 SAFETY (NEEDLE) ×3 IMPLANT
NS IRRIG 500ML POUR BTL (IV SOLUTION) ×3 IMPLANT
PACK HIP COMPR (MISCELLANEOUS) ×3 IMPLANT
SCREW BONE CORTICAL 5.0X42 (Screw) ×3 IMPLANT
SCREW CANN THRD AFF 10.5X100 (Orthopedic Implant) ×1 IMPLANT
SCREW CORTICL BON 5.0MM X 46MM (Screw) ×1 IMPLANT
STAPLER SKIN PROX 35W (STAPLE) ×3 IMPLANT
STRAP SAFETY 5IN WIDE (MISCELLANEOUS) ×3 IMPLANT
SUT VIC AB 0 CT1 36 (SUTURE) ×6 IMPLANT
SUT VIC AB 1 CT1 36 (SUTURE) ×3 IMPLANT
SUT VIC AB 2-0 CT1 (SUTURE) ×6 IMPLANT
SYR 10ML LL (SYRINGE) ×3 IMPLANT
SYR 30ML LL (SYRINGE) ×3 IMPLANT
SYR BULB IRRIG 60ML STRL (SYRINGE) ×3 IMPLANT
TAPE MICROFOAM 4IN (TAPE) IMPLANT

## 2019-12-13 NOTE — Transfer of Care (Signed)
Immediate Anesthesia Transfer of Care Note  Patient: [name]  Procedure(s) Performed: INTRAMEDULLARY (IM) NAIL INTERTROCHANTRIC (Right Hip)  Patient Location: PACU  Anesthesia Type:Spinal  Level of Consciousness: awake, alert  and oriented  Airway & Oxygen Therapy: Patient Spontanous Breathing  Post-op Assessment: Report given to RN and Post -op Vital signs reviewed and stable  Post vital signs: Reviewed and stable  Last Vitals:  Vitals Value Taken Time  BP    Temp    Pulse    Resp    SpO2      Last Pain:  Vitals:   12/13/19 0823  TempSrc: Oral  PainSc:          Complications: No complications documented.

## 2019-12-13 NOTE — Anesthesia Preprocedure Evaluation (Addendum)
Anesthesia Evaluation  Patient identified by MRN, date of birth, ID band Patient awake    Reviewed: Allergy & Precautions, NPO status , Patient's Chart, lab work & pertinent test results  Airway Mallampati: III       Dental   Pulmonary neg pulmonary ROS,           Cardiovascular hypertension, + Peripheral Vascular Disease       Neuro/Psych PSYCHIATRIC DISORDERS Dementia negative neurological ROS     GI/Hepatic negative GI ROS, Neg liver ROS,   Endo/Other  Hypothyroidism   Renal/GU negative Renal ROS  negative genitourinary   Musculoskeletal   Abdominal   Peds negative pediatric ROS (+)  Hematology negative hematology ROS (+)   Anesthesia Other Findings Past Medical History: No date: Dementia (HCC) No date: Glaucoma 02/10/2017: HTN (hypertension) No date: Osteoporosis No date: Thyroid disease Echo 2020 with EF of 25-30%  Reproductive/Obstetrics                            Anesthesia Physical Anesthesia Plan  ASA: III  Anesthesia Plan: Spinal   Post-op Pain Management:    Induction: Intravenous  PONV Risk Score and Plan:   Airway Management Planned: Nasal Cannula  Additional Equipment:   Intra-op Plan:   Post-operative Plan:   Informed Consent: I have reviewed the patients History and Physical, chart, labs and discussed the procedure including the risks, benefits and alternatives for the proposed anesthesia with the patient or authorized representative who has indicated his/her understanding and acceptance.     Dental advisory given  Plan Discussed with: CRNA and Surgeon  Anesthesia Plan Comments:         Anesthesia Quick Evaluation

## 2019-12-13 NOTE — Anesthesia Procedure Notes (Signed)
Spinal  Patient location during procedure: OR Start time: 12/13/2019 10:30 AM End time: 12/13/2019 10:35 AM Staffing Performed: anesthesiologist  Anesthesiologist: Alvin Critchley, MD Preanesthetic Checklist Completed: patient identified, IV checked, site marked, risks and benefits discussed, surgical consent, monitors and equipment checked, pre-op evaluation and timeout performed Spinal Block Patient position: sitting Prep: Betadine Patient monitoring: heart rate, continuous pulse ox, blood pressure and cardiac monitor Approach: right paramedian Location: L3-4 Injection technique: single-shot Needle Needle type: Quincke  Needle gauge: 25 G Needle length: 9 cm Additional Notes Negative paresthesia. Negative blood return. Positive free-flowing CSF. Expiration date of kit checked and confirmed. Patient tolerated procedure well, without complications.

## 2019-12-13 NOTE — Consult Note (Signed)
[facility] Cardiology Consultation Note  Patient ID: [name], MRN: [id number], DOB/AGE: 1927/08/18 86 y.o. Admit date: 12/12/2019   Date of Consult: 12/13/2019 Primary Physician: Jamelle Haring, MD Primary Cardiologist: Paraschos  Chief Complaint:  Chief Complaint  Patient presents with  . Fall   Reason for Consult: Heart failure  HPI: 86 y.o. female with known chronic systolic dysfunction congestive heart failure with ejection fraction of 25 to 30% on appropriate previous medication management.  The patient has not had any recent exacerbation of heart failure or anginal symptoms.  Patient has been on appropriate medication management with good fluid balances with furosemide and metoprolol.  The patient does have hyperlipidemia as well on appropriate lipid management.  The patient has had new onset of mechanical fall and hip fracture for which the patient will need surgery.  Current EKG is unchanged but abnormal at sinus bradycardia with left ventricular hypertrophy and occasional preventricular contractions.  The patient has had no evidence of anginal symptoms or heart failure at this time and no evidence of chest x-ray changes or concerns for issues.  Therefore she is at the lowest risk possible for cardiovascular complication with surgical intervention of hip fracture.  Past Medical History:  Diagnosis Date  . Dementia (HCC)   . Glaucoma   . HTN (hypertension) 02/10/2017  . Osteoporosis   . Thyroid disease       Surgical History:  Past Surgical History:  Procedure Laterality Date  . NO PAST SURGERIES       Home Meds: Prior to Admission medications   Medication Sig Start Date End Date Taking? Authorizing Provider  aspirin EC 81 MG tablet Take 81 mg by mouth daily. Swallow whole.   Yes [provider]  Calcium 600-200 MG-UNIT tablet Take 1 tablet by mouth daily.   Yes [provider]  EUTHYROX 75 MCG tablet TAKE 1 TABLET BY MOUTH ONCE  DAILY BEFORE BREAKFAST 06/19/19  Yes Jamelle Haring, MD  furosemide (LASIX) 20 MG tablet Take 20 mg by mouth daily. 08/16/18  Yes [provider]  memantine (NAMENDA) 10 MG tablet Take 1 tablet by mouth twice daily Patient taking differently: Take 5 mg by mouth 2 (two) times daily.  07/23/19  Yes Welford Roche D, MD  metoprolol succinate (TOPROL-XL) 25 MG 24 hr tablet TAKE 1 TABLET BY MOUTH IN THE MORNING AND 2 NIGHTLY 11/30/19  Yes Welford Roche D, MD  bimatoprost (LUMIGAN) 0.01 % SOLN Place 1 drop into both eyes at bedtime.  Patient not taking: Reported on 12/12/2019    [provider]  brinzolamide (AZOPT) 1 % ophthalmic suspension Place 1 drop into both eyes 2 (two) times daily.  Patient not taking: Reported on 12/12/2019    [provider]  rosuvastatin (CRESTOR) 5 MG tablet TAKE 1 TABLET BY MOUTH AT BEDTIME Patient not taking: Reported on 12/12/2019 06/19/19   Jamelle Haring, MD  VITAMIN D, ERGOCALCIFEROL, PO Take 2,000 tablets by mouth daily.  Patient not taking: Reported on 12/12/2019    [provider]    Inpatient Medications:   . sodium chloride 50 mL/hr at 12/12/19 2253    Allergies: No Known Allergies  Social History   Socioeconomic History  . Marital status: Widowed    Spouse name: Not on file  . Number of children: 6  . Years of education: Not on file  . Highest education level: 10th grade  Occupational History    Employer: RETIRED  Tobacco Use  .  Smoking status: Never Smoker  . Smokeless tobacco: Never Used  . Tobacco comment: smoking cessation materials not required  Vaping Use  . Vaping Use: Never used  Substance and Sexual Activity  . Alcohol use: No    Alcohol/week: 0.0 standard drinks  . Drug use: No  . Sexual activity: Not Currently  Other Topics Concern  . Not on file  Social History Narrative  . Not on file   Social Determinants of Health   Financial Resource Strain:   . Difficulty of  Paying Living Expenses: Not on file  Food Insecurity:   . Worried About Programme researcher, broadcasting/film/video in the Last Year: Not on file  . Ran Out of Food in the Last Year: Not on file  Transportation Needs:   . Lack of Transportation (Medical): Not on file  . Lack of Transportation (Non-Medical): Not on file  Physical Activity:   . Days of Exercise per Week: Not on file  . Minutes of Exercise per Session: Not on file  Stress:   . Feeling of Stress : Not on file  Social Connections:   . Frequency of Communication with Friends and Family: Not on file  . Frequency of Social Gatherings with Friends and Family: Not on file  . Attends Religious Services: Not on file  . Active Member of Clubs or Organizations: Not on file  . Attends Banker Meetings: Not on file  . Marital Status: Not on file  Intimate Partner Violence:   . Fear of Current or Ex-Partner: Not on file  . Emotionally Abused: Not on file  . Physically Abused: Not on file  . Sexually Abused: Not on file     Family History  Problem Relation Age of Onset  . Cancer Father   . Hyperlipidemia Sister   . Heart disease Brother   . Stroke Neg Hx      Review of Systems Cannot assess due to dementia Labs: No results for input(s): CKTOTAL, CKMB, TROPONINI in the last 72 hours. Lab Results  Component Value Date   WBC 10.5 12/13/2019   HGB 10.6 (L) 12/13/2019   HCT 32.2 (L) 12/13/2019   MCV 91.5 12/13/2019   PLT 220 12/13/2019    Recent Labs  Lab 12/13/19 0545  NA 140  K 3.6  CL 102  CO2 30  BUN 12  CREATININE 0.66  CALCIUM 8.4*  GLUCOSE 166*   Lab Results  Component Value Date   CHOL 189 02/01/2018   HDL 84 02/01/2018   LDLCALC 86 02/01/2018   TRIG 97 02/01/2018   No results found for: DDIMER  Radiology/Studies:  DG Chest 1 View  Result Date: 12/12/2019 CLINICAL DATA:  Fall EXAM: CHEST  1 VIEW COMPARISON:  None. FINDINGS: Diffuse bilateral reticular opacity greatest at the bases and periphery suggesting  pulmonary fibrosis. No acute consolidation, pleural effusion or pneumothorax. Cardiac size within normal limits. Retrocardiac opacity, question hiatal hernia. Mild convex right paratracheal opacity. No pneumothorax. Possible left mid clavicle deformity but limited evaluation secondary to osseous overlap. IMPRESSION: 1. Findings suspicious for pulmonary fibrosis. No acute infiltrate, edema or pneumothorax. 2. Possible fracture deformity of the mid left clavicle, age indeterminate. Consider dedicated left clavicle views if painful here. 3. Possible hiatal hernia. 4. Slight convex opacity in the right paratracheal region, likely augmented by rotation and position, recommend dedicated two view chest to assess for persistence. Electronically Signed   By: Jasmine Pang M.D.   On: 12/12/2019 21:23   CT Head  Wo Contrast  Result Date: 12/12/2019 CLINICAL DATA:  Unwitnessed fall. EXAM: CT HEAD WITHOUT CONTRAST TECHNIQUE: Contiguous axial images were obtained from the base of the skull through the vertex without intravenous contrast. COMPARISON:  December 23, 2011 FINDINGS: Brain: There is mild cerebral atrophy with widening of the extra-axial spaces and ventricular dilatation. There are areas of decreased attenuation within the white matter tracts of the supratentorial brain, consistent with microvascular disease changes. Vascular: No hyperdense vessel or unexpected calcification. Skull: Normal. Negative for fracture or focal lesion. Sinuses/Orbits: No acute finding. Other: None. IMPRESSION: 1. Generalized cerebral atrophy. 2. No acute intracranial abnormality. Electronically Signed   By: Aram Candela M.D.   On: 12/12/2019 21:32   CT Cervical Spine Wo Contrast  Result Date: 12/12/2019 CLINICAL DATA:  Status post fall. EXAM: CT CERVICAL SPINE WITHOUT CONTRAST TECHNIQUE: Multidetector CT imaging of the cervical spine was performed without intravenous contrast. Multiplanar CT image reconstructions were also generated.  COMPARISON:  None. FINDINGS: Alignment: Normal. Skull base and vertebrae: No acute fracture. No primary bone lesion or focal pathologic process. Soft tissues and spinal canal: No prevertebral fluid or swelling. No visible canal hematoma. Disc levels: Mild endplate sclerosis is seen at the level of C5-C6. Mild to moderate severity intervertebral disc space narrowing is also seen at this level. Mild intervertebral disc space narrowing is noted throughout the remainder of the cervical spine. Mild to moderate severity bilateral multilevel facet joint hypertrophy is noted. Upper chest: Negative. Other: None. IMPRESSION: 1. Mild to moderate severity degenerative changes of the cervical spine, most prominent at the level of C5-C6. 2. No evidence of an acute fracture or subluxation. Electronically Signed   By: Aram Candela M.D.   On: 12/12/2019 21:35   DG Hip Unilat W or Wo Pelvis 2-3 Views Right  Result Date: 12/12/2019 CLINICAL DATA:  Fall EXAM: DG HIP (WITH OR WITHOUT PELVIS) 2-3V RIGHT COMPARISON:  None. FINDINGS: There is a comminuted intertrochanteric fracture of the proximal right femur. No dislocation of the femoral head. No other pelvic fracture. IMPRESSION: Comminuted intertrochanteric fracture of the proximal right femur. Electronically Signed   By: Deatra Robinson M.D.   On: 12/12/2019 21:21    EKG: Sinus bradycardia with left ventricular hypertrophy and preventricular contractions  Weights: Filed Weights   12/12/19 2035  Weight: 61.1 kg     Physical Exam: Blood pressure (!) 164/84, pulse 62, temperature 98.1 F (36.7 C), temperature source Oral, resp. rate 16, height 5\' 5"  (1.651 m), weight 61.1 kg, SpO2 98 %. Body mass index is 22.42 kg/m. General: Well developed, well nourished, in no acute distress. Head eyes ears nose throat: Normocephalic, atraumatic, sclera non-icteric, no xanthomas, nares are without discharge. No apparent thyromegaly and/or mass  Lungs: Normal respiratory  effort.  Few wheezes, no rales, no rhonchi.  Few basilar crackles Heart: Irregular with normal S1 S2. no murmur gallop, no rub, PMI is normal size and placement, carotid upstroke normal without bruit, jugular venous pressure is normal Abdomen: Soft, non-tender, non-distended with normoactive bowel sounds. No hepatomegaly. No rebound/guarding. No obvious abdominal masses. Abdominal aorta is normal size without bruit Extremities: No edema. no cyanosis, no clubbing, no ulcers  Peripheral : 2+ bilateral upper extremity pulses, 2+ bilateral femoral pulses, 2+ bilateral dorsal pedal pulse Neuro: Not alert and oriented. No facial asymmetry. No focal deficit. Moves all extremities spontaneously. Musculoskeletal: Normal muscle tone without kyphosis Psych: Does not responds to questions appropriately with a normal affect.    Assessment: 86 year old female with  known chronic systolic dysfunction congestive heart failure on appropriate medication management currently without evidence of congestive heart failure symptoms or anginal symptoms or myocardial infarction with a mechanical fall needing surgical intervention.  Patient is at lowest risk possible with appropriate medication management for current cardiovascular concerns for orthopedic surgery  Plan: 1.  Proceed to orthopedic surgery for hip fracture due to mechanical fall 2.  Continue furosemide orally for fluid balances watching for any congestive heart failure hypoxia symptoms 3.  Continue metoprolol at current dose watching for any bradycardia 4.  No further cardiac diagnostics necessary at this time for treatment options 5.  No restrictions to rehabilitation  Signed, Lamar BlinksBruce J Dyneshia Baccam M.D. [facility] 12/13/2019, 8:17 AM

## 2019-12-13 NOTE — Consult Note (Signed)
ORTHOPAEDIC CONSULTATION  REQUESTING PHYSICIAN: Lynn Ito, MD  Chief Complaint:   Right hip pain.  History of Present Illness: [name] is a 86 y.o. female with a history of hypertension, osteoporosis, hypothyroidism, and dementia who lives with her granddaughter was in her usual state of health until last evening when she apparently lost her balance and fell while in her kitchen, landing on her right side.  She was brought to the emergency room where x-rays demonstrated a displaced subtrochanteric fracture of the right hip.  The patient denies any associated injuries.  She did not strike her head or lose consciousness.  The patient also denies any lightheadedness, dizziness, chest pain, shortness of breath, or other symptoms which may have precipitated her fall.  Past Medical History:  Diagnosis Date  . Dementia (HCC)   . Glaucoma   . HTN (hypertension) 02/10/2017  . Osteoporosis   . Thyroid disease    Past Surgical History:  Procedure Laterality Date  . NO PAST SURGERIES     Social History   Socioeconomic History  . Marital status: Widowed    Spouse name: Not on file  . Number of children: 6  . Years of education: Not on file  . Highest education level: 10th grade  Occupational History    Employer: RETIRED  Tobacco Use  . Smoking status: Never Smoker  . Smokeless tobacco: Never Used  . Tobacco comment: smoking cessation materials not required  Vaping Use  . Vaping Use: Never used  Substance and Sexual Activity  . Alcohol use: No    Alcohol/week: 0.0 standard drinks  . Drug use: No  . Sexual activity: Not Currently  Other Topics Concern  . Not on file  Social History Narrative  . Not on file   Social Determinants of Health   Financial Resource Strain:   . Difficulty of Paying Living Expenses: Not on file  Food Insecurity:   . Worried About Programme researcher, broadcasting/film/video in the Last Year: Not on  file  . Ran Out of Food in the Last Year: Not on file  Transportation Needs:   . Lack of Transportation (Medical): Not on file  . Lack of Transportation (Non-Medical): Not on file  Physical Activity:   . Days of Exercise per Week: Not on file  . Minutes of Exercise per Session: Not on file  Stress:   . Feeling of Stress : Not on file  Social Connections:   . Frequency of Communication with Friends and Family: Not on file  . Frequency of Social Gatherings with Friends and Family: Not on file  . Attends Religious Services: Not on file  . Active Member of Clubs or Organizations: Not on file  . Attends Banker Meetings: Not on file  . Marital Status: Not on file   Family History  Problem Relation Age of Onset  . Cancer Father   . Hyperlipidemia Sister   . Heart disease Brother   . Stroke Neg Hx    No Known Allergies Prior to Admission medications   Medication Sig Start Date End Date Taking? Authorizing Provider  aspirin EC 81 MG tablet Take 81 mg by mouth daily. Swallow whole.   Yes [provider]  Calcium 600-200 MG-UNIT tablet Take 1 tablet by mouth daily.   Yes [provider]  EUTHYROX 75 MCG tablet TAKE 1 TABLET BY MOUTH ONCE DAILY BEFORE BREAKFAST 06/19/19  Yes Jamelle Haring, MD  furosemide (LASIX) 20 MG tablet Take 20 mg  by mouth daily. 08/16/18  Yes [provider]  memantine (NAMENDA) 10 MG tablet Take 1 tablet by mouth twice daily Patient taking differently: Take 5 mg by mouth 2 (two) times daily.  07/23/19  Yes Welford Roche D, MD  metoprolol succinate (TOPROL-XL) 25 MG 24 hr tablet TAKE 1 TABLET BY MOUTH IN THE MORNING AND 2 NIGHTLY 11/30/19  Yes Welford Roche D, MD  bimatoprost (LUMIGAN) 0.01 % SOLN Place 1 drop into both eyes at bedtime.  Patient not taking: Reported on 12/12/2019    [provider]  brinzolamide (AZOPT) 1 % ophthalmic suspension Place 1 drop into both eyes 2 (two) times daily.   Patient not taking: Reported on 12/12/2019    [provider]  rosuvastatin (CRESTOR) 5 MG tablet TAKE 1 TABLET BY MOUTH AT BEDTIME Patient not taking: Reported on 12/12/2019 06/19/19   Jamelle Haring, MD  VITAMIN D, ERGOCALCIFEROL, PO Take 2,000 tablets by mouth daily.  Patient not taking: Reported on 12/12/2019    [provider]   DG Chest 1 View  Result Date: 12/12/2019 CLINICAL DATA:  Fall EXAM: CHEST  1 VIEW COMPARISON:  None. FINDINGS: Diffuse bilateral reticular opacity greatest at the bases and periphery suggesting pulmonary fibrosis. No acute consolidation, pleural effusion or pneumothorax. Cardiac size within normal limits. Retrocardiac opacity, question hiatal hernia. Mild convex right paratracheal opacity. No pneumothorax. Possible left mid clavicle deformity but limited evaluation secondary to osseous overlap. IMPRESSION: 1. Findings suspicious for pulmonary fibrosis. No acute infiltrate, edema or pneumothorax. 2. Possible fracture deformity of the mid left clavicle, age indeterminate. Consider dedicated left clavicle views if painful here. 3. Possible hiatal hernia. 4. Slight convex opacity in the right paratracheal region, likely augmented by rotation and position, recommend dedicated two view chest to assess for persistence. Electronically Signed   By: Jasmine Pang M.D.   On: 12/12/2019 21:23   CT Head Wo Contrast  Result Date: 12/12/2019 CLINICAL DATA:  Unwitnessed fall. EXAM: CT HEAD WITHOUT CONTRAST TECHNIQUE: Contiguous axial images were obtained from the base of the skull through the vertex without intravenous contrast. COMPARISON:  December 23, 2011 FINDINGS: Brain: There is mild cerebral atrophy with widening of the extra-axial spaces and ventricular dilatation. There are areas of decreased attenuation within the white matter tracts of the supratentorial brain, consistent with microvascular disease changes. Vascular: No hyperdense vessel or unexpected  calcification. Skull: Normal. Negative for fracture or focal lesion. Sinuses/Orbits: No acute finding. Other: None. IMPRESSION: 1. Generalized cerebral atrophy. 2. No acute intracranial abnormality. Electronically Signed   By: Aram Candela M.D.   On: 12/12/2019 21:32   CT Cervical Spine Wo Contrast  Result Date: 12/12/2019 CLINICAL DATA:  Status post fall. EXAM: CT CERVICAL SPINE WITHOUT CONTRAST TECHNIQUE: Multidetector CT imaging of the cervical spine was performed without intravenous contrast. Multiplanar CT image reconstructions were also generated. COMPARISON:  None. FINDINGS: Alignment: Normal. Skull base and vertebrae: No acute fracture. No primary bone lesion or focal pathologic process. Soft tissues and spinal canal: No prevertebral fluid or swelling. No visible canal hematoma. Disc levels: Mild endplate sclerosis is seen at the level of C5-C6. Mild to moderate severity intervertebral disc space narrowing is also seen at this level. Mild intervertebral disc space narrowing is noted throughout the remainder of the cervical spine. Mild to moderate severity bilateral multilevel facet joint hypertrophy is noted. Upper chest: Negative. Other: None. IMPRESSION: 1. Mild to moderate severity degenerative changes of the cervical spine, most prominent at the level of  C5-C6. 2. No evidence of an acute fracture or subluxation. Electronically Signed   By: Aram Candela M.D.   On: 12/12/2019 21:35   DG Hip Unilat W or Wo Pelvis 2-3 Views Right  Result Date: 12/12/2019 CLINICAL DATA:  Fall EXAM: DG HIP (WITH OR WITHOUT PELVIS) 2-3V RIGHT COMPARISON:  None. FINDINGS: There is a comminuted intertrochanteric fracture of the proximal right femur. No dislocation of the femoral head. No other pelvic fracture. IMPRESSION: Comminuted intertrochanteric fracture of the proximal right femur. Electronically Signed   By: Deatra Robinson M.D.   On: 12/12/2019 21:21    Positive ROS: All other systems have been reviewed  and were otherwise negative with the exception of those mentioned in the HPI and as above.  Physical Exam: General:  Alert, no acute distress Psychiatric:  Patient is not competent for consent, but exhibits a normal mood and affect   Cardiovascular:  No pedal edema Respiratory:  No wheezing, non-labored breathing GI:  Abdomen is soft and non-tender Skin:  No lesions in the area of chief complaint Neurologic:  Sensation intact distally Lymphatic:  No axillary or cervical lymphadenopathy  Orthopedic Exam:  Orthopedic examination is limited to the right hip and lower extremity.  The right lower extremity somewhat shortened and externally rotated as compared to the left.  Skin inspection around the right hip is notable for moderate swelling, but otherwise is unremarkable.  No erythema, ecchymosis, abrasions, or other skin abnormalities are identified.  She has moderate tenderness to palpation over the lateral aspect of the proximal thigh.  She has more severe pain with any attempted active or passive motion of the right leg.  She is neurovascularly intact to the right lower extremity and foot.  X-rays:  Recent x-rays of the pelvis and right hip are available for review and have been reviewed by myself.  These films demonstrate a displaced intertrochanteric/subtrochanteric fracture.  No significant degenerative changes of the right hip joint are noted.  No lytic lesions or other bony abnormalities are identified.  Assessment: Closed displaced intertrochanteric/subtrochanteric fracture right hip.  Plan: The treatment options, including both surgical and nonsurgical choices, have been discussed in detail with the patient and her family. The patient and her family would like to proceed with surgical intervention to include an intramedullary nailing of the intertrochanteric/subtrochanteric right hip fracture. The risks (including bleeding, infection, nerve and/or blood vessel injury, persistent or  recurrent pain, loosening or failure of the components, leg length inequality, dislocation, need for further surgery, blood clots, strokes, heart attacks or arrhythmias, pneumonia, etc.) and benefits of the surgical procedure were discussed. The patient states his/her understanding and agrees to proceed. She agrees to a blood transfusion if necessary. A formal written consent will be obtained by the nursing staff.  Thank you for asking me to participate in the care of this most pleasant yet unfortunate woman.  I will be happy to follow her with you.   Maryagnes Amos, MD  Beeper #:  [phone]  12/13/2019 12:22 PM

## 2019-12-13 NOTE — Progress Notes (Signed)
Initial Nutrition Assessment  DOCUMENTATION CODES:   Not applicable  INTERVENTION:  Boost Breeze po TID, each supplement provides 250 kcal and 9 grams of protein  MVI with minerals daily  Will provide Ensure Enlive supplement with diet advancement   NUTRITION DIAGNOSIS:   Increased nutrient needs related to post-op healing, hip fracture as evidenced by estimated needs.  GOAL:   Patient will meet greater than or equal to 90% of their needs    MONITOR:   Diet advancement, Labs, Weight trends, Supplement acceptance, PO intake, Skin  REASON FOR ASSESSMENT:   Consult Hip fracture protocol  ASSESSMENT:   86 year old female with history of HTN, osteoporosis, hypothyroidism, and dementia admitted with right hip fracture.  Patient off floor for procedure at RD attempt to see pt and unable to obtain history at this time. She underwent reduction and internal fixation of displaced subtrochanteric right hip fracture today. She was NPO for procedure, diet advanced to clears this afternoon.  Will order Boost Breeze and MVI to aid with meeting needs.   Per chart, weights have been fairly stable 59.4-61.1 kg over the past 7 months.  Medications reviewed and include: Oscal with D, Colace, Lasix 20 mg daily, Namenda, Ancef 2mg  IV every 6 hrs Labs reviewed  NUTRITION - FOCUSED PHYSICAL EXAM: Unable to perform at this time, pt off floor   Diet Order:   Diet Order            Diet clear liquid Room service appropriate? Yes; Fluid consistency: Thin  Diet effective now                 EDUCATION NEEDS:   No education needs have been identified at this time  Skin:  Skin Assessment: Skin Integrity Issues: Skin Integrity Issues:: Incisions, Other (Comment) Incisions: closed; right leg Other: abraison; right arm  Last BM:  pta  Height:   Ht Readings from Last 1 Encounters:  12/12/19 5\' 5"  (1.651 m)    Weight:   Wt Readings from Last 1 Encounters:  12/12/19 61.1 kg     Ideal Body Weight:  56.8 kg  BMI:  Body mass index is 22.42 kg/m.  Estimated Nutritional Needs:   Kcal:  1600-1800  Protein:  75-85  Fluid:  >1.5 L/day   , RD, LDN Clinical Nutrition After Hours/Weekend Pager # in Amion

## 2019-12-13 NOTE — Plan of Care (Signed)

## 2019-12-13 NOTE — Progress Notes (Signed)
PROGRESS NOTE    [name]  TIR:[id number] DOB: [birth year] DOA: 12/12/2019 PCP: Jamelle Haring, MD    Brief Narrative:  [name] is a 86 y.o. female with medical history significant for dementia, chronic systolic heart failure, with dilated cardiomyopathy, last EF 25 to 30% 04/01/2018, nonrheumatic aortic insufficiency, HTN, HLD who suffered an unwitnessed fall in her home.  According to granddaughter, she heard a thud in the kitchen and went and saw her on the floor.  She stated that a chair was out of place so it appeared that she tripped over the chair.  The fire department came to help her up and assisted her up she appeared to have pain on the right hip and they brought her to the emergency room for evaluation.  History is limited due to Alzheimer's dementia.   Consultants:     Procedures:   Antimicrobials:       Subjective: Daughter at bedside. Pt sleepy, but has no complaints when responds to my questions.   Objective: Vitals:   12/13/19 1509 12/13/19 1637 12/13/19 1736 12/13/19 1841  BP: (!) 137/53 (!) 141/64 (!) 153/63 (!) 137/59  Pulse: 64 73 79 78  Resp: 18 17 18 15   Temp: (!) 97.4 F (36.3 C) 97.6 F (36.4 C)  (!) 97.5 F (36.4 C)  TempSrc: Axillary Oral  Oral  SpO2: 100% 98% 99% 97%  Weight:      Height:        Intake/Output Summary (Last 24 hours) at 12/13/2019 2124 Last data filed at 12/13/2019 1856 Gross per 24 hour  Intake 250 ml  Output 275 ml  Net -25 ml   Filed Weights   12/12/19 2035  Weight: 61.1 kg    Examination:  General exam: Appears calm and comfortable  Respiratory system: Clear to auscultation. Respiratory effort normal. Cardiovascular system: S1 & S2 heard, RRR. No JVD, murmurs, rubs, gallops or clicks. No pedal edema. Gastrointestinal system: Abdomen is nondistended, soft and nontender. No organomegaly or masses felt. Normal bowel sounds heard. Central nervous system: Alert and oriented. No focal  neurological deficits. Extremities:no edema Skin: warm, dry Psychiatry: Judgement and insight appear normal. Mood & affect appropriate.     Data Reviewed: I have personally reviewed following labs and imaging studies  CBC: Recent Labs  Lab 12/12/19 2045 12/13/19 0545  WBC 9.4 10.5  NEUTROABS 5.5  --   HGB 12.1 10.6*  HCT 36.6 32.2*  MCV 89.7 91.5  PLT 258 220   Basic Metabolic Panel: Recent Labs  Lab 12/12/19 2045 12/13/19 0545  NA 140 140  K 2.9* 3.6  CL 101 102  CO2 29 30  GLUCOSE 143* 166*  BUN 13 12  CREATININE 0.73 0.66  CALCIUM 8.5* 8.4*   GFR: Estimated Creatinine Clearance: 42.9 mL/min (by C-G formula based on SCr of 0.66 mg/dL). Liver Function Tests: No results for input(s): AST, ALT, ALKPHOS, BILITOT, PROT, ALBUMIN in the last 168 hours. No results for input(s): LIPASE, AMYLASE in the last 168 hours. No results for input(s): AMMONIA in the last 168 hours. Coagulation Profile: Recent Labs  Lab 12/13/19 0545  INR 0.9   Cardiac Enzymes: No results for input(s): CKTOTAL, CKMB, CKMBINDEX, TROPONINI in the last 168 hours. BNP (last 3 results) No results for input(s): PROBNP in the last 8760 hours. HbA1C: No results for input(s): HGBA1C in the last 72 hours. CBG: No results for input(s): GLUCAP in the last 168 hours. Lipid Profile: No results for input(s): CHOL,  HDL, LDLCALC, TRIG, CHOLHDL, LDLDIRECT in the last 72 hours. Thyroid Function Tests: No results for input(s): TSH, T4TOTAL, FREET4, T3FREE, THYROIDAB in the last 72 hours. Anemia Panel: No results for input(s): VITAMINB12, FOLATE, FERRITIN, TIBC, IRON, RETICCTPCT in the last 72 hours. Sepsis Labs: No results for input(s): PROCALCITON, LATICACIDVEN in the last 168 hours.  Recent Results (from the past 240 hour(s))  Respiratory Panel by RT PCR (Flu A&B, Covid) - Nasopharyngeal Swab     Status: None   Collection Time: 12/12/19  9:42 PM   Specimen: Nasopharyngeal Swab  Result Value Ref Range  Status   SARS Coronavirus 2 by RT PCR NEGATIVE NEGATIVE Final    Comment: (NOTE) SARS-CoV-2 target nucleic acids are NOT DETECTED.  The SARS-CoV-2 RNA is generally detectable in upper respiratoy specimens during the acute phase of infection. The lowest concentration of SARS-CoV-2 viral copies this assay can detect is 131 copies/mL. A negative result does not preclude SARS-Cov-2 infection and should not be used as the sole basis for treatment or other patient management decisions. A negative result may occur with  improper specimen collection/handling, submission of specimen other than nasopharyngeal swab, presence of viral mutation(s) within the areas targeted by this assay, and inadequate number of viral copies (<131 copies/mL). A negative result must be combined with clinical observations, patient history, and epidemiological information. The expected result is Negative.  Fact Sheet for Patients:  https://www.moore.com/  Fact Sheet for Healthcare Providers:  https://www.young.biz/  This test is no t yet approved or cleared by the [place] FDA and  has been authorized for detection and/or diagnosis of SARS-CoV-2 by FDA under an Emergency Use Authorization (EUA). This EUA will remain  in effect (meaning this test can be used) for the duration of the COVID-19 declaration under Section 564(b)(1) of the Act, 21 U.S.C. section 360bbb-3(b)(1), unless the authorization is terminated or revoked sooner.     Influenza A by PCR NEGATIVE NEGATIVE Final   Influenza B by PCR NEGATIVE NEGATIVE Final    Comment: (NOTE) The Xpert Xpress SARS-CoV-2/FLU/RSV assay is intended as an aid in  the diagnosis of influenza from Nasopharyngeal swab specimens and  should not be used as a sole basis for treatment. Nasal washings and  aspirates are unacceptable for Xpert Xpress SARS-CoV-2/FLU/RSV  testing.  Fact Sheet for  Patients: https://www.moore.com/  Fact Sheet for Healthcare Providers: https://www.young.biz/  This test is not yet approved or cleared by the [place] FDA and  has been authorized for detection and/or diagnosis of SARS-CoV-2 by  FDA under an Emergency Use Authorization (EUA). This EUA will remain  in effect (meaning this test can be used) for the duration of the  Covid-19 declaration under Section 564(b)(1) of the Act, 21  U.S.C. section 360bbb-3(b)(1), unless the authorization is  terminated or revoked. Performed at [facility], [address] [place], [place] [address]   Surgical PCR screen     Status: None   Collection Time: 12/13/19  1:22 AM   Specimen: Nasal Mucosa; Nasal Swab  Result Value Ref Range Status   MRSA, PCR NEGATIVE NEGATIVE Final   Staphylococcus aureus NEGATIVE NEGATIVE Final    Comment: (NOTE) The Xpert SA Assay (FDA approved for NASAL specimens in patients 34 years of age and older), is one component of a comprehensive surveillance program. It is not intended to diagnose infection nor to guide or monitor treatment. Performed at [facility], [address], [place], [place] [address]  Radiology Studies: DG Chest 1 View  Result Date: 12/12/2019 CLINICAL DATA:  Fall EXAM: CHEST  1 VIEW COMPARISON:  None. FINDINGS: Diffuse bilateral reticular opacity greatest at the bases and periphery suggesting pulmonary fibrosis. No acute consolidation, pleural effusion or pneumothorax. Cardiac size within normal limits. Retrocardiac opacity, question hiatal hernia. Mild convex right paratracheal opacity. No pneumothorax. Possible left mid clavicle deformity but limited evaluation secondary to osseous overlap. IMPRESSION: 1. Findings suspicious for pulmonary fibrosis. No acute infiltrate, edema or pneumothorax. 2. Possible fracture deformity of the mid left clavicle, age indeterminate. Consider  dedicated left clavicle views if painful here. 3. Possible hiatal hernia. 4. Slight convex opacity in the right paratracheal region, likely augmented by rotation and position, recommend dedicated two view chest to assess for persistence. Electronically Signed   By: Jasmine PangKim  Fujinaga M.D.   On: 12/12/2019 21:23   CT Head Wo Contrast  Result Date: 12/12/2019 CLINICAL DATA:  Unwitnessed fall. EXAM: CT HEAD WITHOUT CONTRAST TECHNIQUE: Contiguous axial images were obtained from the base of the skull through the vertex without intravenous contrast. COMPARISON:  December 23, 2011 FINDINGS: Brain: There is mild cerebral atrophy with widening of the extra-axial spaces and ventricular dilatation. There are areas of decreased attenuation within the white matter tracts of the supratentorial brain, consistent with microvascular disease changes. Vascular: No hyperdense vessel or unexpected calcification. Skull: Normal. Negative for fracture or focal lesion. Sinuses/Orbits: No acute finding. Other: None. IMPRESSION: 1. Generalized cerebral atrophy. 2. No acute intracranial abnormality. Electronically Signed   By: Aram Candelahaddeus  Houston M.D.   On: 12/12/2019 21:32   CT Cervical Spine Wo Contrast  Result Date: 12/12/2019 CLINICAL DATA:  Status post fall. EXAM: CT CERVICAL SPINE WITHOUT CONTRAST TECHNIQUE: Multidetector CT imaging of the cervical spine was performed without intravenous contrast. Multiplanar CT image reconstructions were also generated. COMPARISON:  None. FINDINGS: Alignment: Normal. Skull base and vertebrae: No acute fracture. No primary bone lesion or focal pathologic process. Soft tissues and spinal canal: No prevertebral fluid or swelling. No visible canal hematoma. Disc levels: Mild endplate sclerosis is seen at the level of C5-C6. Mild to moderate severity intervertebral disc space narrowing is also seen at this level. Mild intervertebral disc space narrowing is noted throughout the remainder of the cervical spine.  Mild to moderate severity bilateral multilevel facet joint hypertrophy is noted. Upper chest: Negative. Other: None. IMPRESSION: 1. Mild to moderate severity degenerative changes of the cervical spine, most prominent at the level of C5-C6. 2. No evidence of an acute fracture or subluxation. Electronically Signed   By: Aram Candelahaddeus  Houston M.D.   On: 12/12/2019 21:35   DG HIP OPERATIVE UNILAT W OR W/O PELVIS RIGHT  Result Date: 12/13/2019 CLINICAL DATA:  Proximal right femoral fracture EXAM: OPERATIVE RIGHT HIP WITH PELVIS COMPARISON:  12/12/2019 FLUOROSCOPY TIME:  Radiation Exposure Index (as provided by the fluoroscopic device): 12.2 mGy If the device does not provide the exposure index: Fluoroscopy Time:  1 minutes 12 seconds Number of Acquired Images:  4 FINDINGS: Four spot films were obtained intraoperatively and reveal a medullary rod in the right femur. Fracture fragments have been reduced. Proximal and distal fixation screws are noted. IMPRESSION: Status post ORIF of proximal right femoral fracture. Electronically Signed   By: Alcide CleverMark  Lukens M.D.   On: 12/13/2019 13:28   DG Hip Unilat W or Wo Pelvis 2-3 Views Right  Result Date: 12/12/2019 CLINICAL DATA:  Fall EXAM: DG HIP (WITH OR WITHOUT PELVIS) 2-3V RIGHT COMPARISON:  None. FINDINGS: There  is a comminuted intertrochanteric fracture of the proximal right femur. No dislocation of the femoral head. No other pelvic fracture. IMPRESSION: Comminuted intertrochanteric fracture of the proximal right femur. Electronically Signed   By: Deatra Robinson M.D.   On: 12/12/2019 21:21        Scheduled Meds: . acetaminophen  500 mg Oral Q6H  . calcium-vitamin D  1 tablet Oral Daily  . Chlorhexidine Gluconate Cloth  6 each Topical Daily  . docusate sodium  100 mg Oral BID  . [START ON 12/14/2019] enoxaparin (LOVENOX) injection  40 mg Subcutaneous Q24H  . feeding supplement  1 Container Oral TID BM  . furosemide  20 mg Oral Daily  . [START ON 12/14/2019]  levothyroxine  75 mcg Oral Q0600  . memantine  10 mg Oral BID  . [START ON 12/14/2019] metoprolol succinate  25 mg Oral Q0600  . metoprolol succinate  50 mg Oral Q2200  . multivitamin with minerals  1 tablet Oral Daily   Continuous Infusions: . sodium chloride 50 mL/hr at 12/12/19 2253  .  ceFAZolin (ANCEF) IV 2 g (12/13/19 1728)    Assessment & Plan:   Principal Problem:   Closed right hip fracture (HCC) Active Problems:   Hypothyroidism   Dementia of the Alzheimer's type without behavioral disturbance (HCC)   HTN (hypertension)   Aortic insufficiency   Chronic systolic heart failure (HCC)   Fall at home, initial encounter   Preoperative clearance   Dilated cardiomyopathy (HCC)   Closed right hip fracture (HCC)   Fall at home, initial encounter   Preoperative clearance -Fall was unwitnessed, but suspect mechanical.  S/p surgery today Pain mx Bowel regimen PT when cleared by surgery    Chronic systolic heart failure (HCC)   Aortic insufficiency   Dilated cardiomyopathy (HCC) -Last echocardiogram was 04/01/2018 that showed EF 25 to 30% D/c IVF to avoid fluid overload Continue beta blk Monitor I/o  Hypokalemia Supplemented and now stable. K 3.6    Hypothyroidism Continue synthroid    Dementia of the Alzheimer's type without behavioral disturbance (HCC) Resume Namenda    HTN (hypertension) -Continue metoprolol  Glaucoma -Continue eyedrops      DVT prophylaxis: scd, then lovenox Code Status:DNR Family Communication: daughter at bedside  Status is: Inpatient  Remains inpatient appropriate because:Unsafe d/c plan   Dispo: The patient is from: Home              Anticipated d/c is to: snf vs home              Anticipated d/c date is: 1 day              Patient currently is not medically stable to d/c.PT needs to evaluate as pt is s/p surgery today            LOS: 1 day   Time spent: with >50% on coc    Lynn Ito, MD [facility] Pager [phone]  If 7PM-7AM, please contact night-coverage [email or web address] Password [facility] 12/13/2019, 9:24 PM

## 2019-12-13 NOTE — Op Note (Signed)
12/13/2019  12:19 PM  Patient:   [name]  Pre-Op Diagnosis:   Closed displaced subtrochanteric fracture, right hip.  Post-Op Diagnosis:   Same  Procedure:   Reduction and internal fixation of displaced subtrochanteric right hip fracture with Biomet Affixis TFN nail.  Surgeon:   Maryagnes Amos, MD  Assistant:   None  Anesthesia:   Spinal  Findings:   As above  Complications:   None  EBL:   75 cc  Fluids:   300 cc crystalloid  UOP:   200 cc  TT:   None  Drains:   None  Closure:   Staples  Implants:   Biomet Affixis 11 x 400 mm TFN with a 100 mm lag screw and 44 mm and 46 mm distal interlocking screws  Brief Clinical Note:   The patient is an 86 year old female who sustained the above-noted injury last evening when she tripped and fell in her kitchen. She was brought to the emergency room where x-rays demonstrated the above-noted injury. The patient has been cleared medically and presents at this time for reduction and internal fixation of the displaced subtrochanteric right hip fracture.  Procedure:   The patient was brought into the operating room. After adequate spinal anesthesia was obtained, the patient was lain in the supine position on the fracture table. The uninjured leg was placed in a flexed and abducted position while the injured lower extremity was placed in longitudinal traction. The fracture was reduced using longitudinal traction and internal rotation. The adequacy of reduction was verified fluoroscopically in AP and lateral projections and found to be near anatomic. The lateral aspects of the right hip and thigh were prepped with ChloraPrep solution before being draped sterilely. Preoperative antibiotics were administered. A timeout was performed to verify the appropriate surgical site. The greater trochanter was identified fluoroscopically and an approximately 5 cm incision made about 2-3 fingerbreadths above the tip of the greater trochanter. The  incision was carried down through the subcutaneous tissues to expose the gluteal fascia. This was split the length of the incision, providing access to the tip of the trochanter. Under fluoroscopic guidance, a guidewire was drilled through the tip of the trochanter into the proximal metaphysis to the level of the lesser trochanter. After verifying its position fluoroscopically in AP and lateral projections, it was overreamed with the initial reamer to the depth of the lesser trochanter. A guidewire was passed down through the femoral canal to the supracondylar region. The adequacy of guidewire position was verified fluoroscopically in AP and lateral projections before the length of the guidewire within the canal was measured and found to be 430 mm. Therefore, a 400 mm length nail was selected. The guidewire was overreamed sequentially using the flexible reamers, beginning with a 9.5 mm reamer and progressing to a 12.5 mm reamer. This provided good cortical chatter. The 11 x 400 mm Biomet Affixis TFN rod was selected and advanced to the appropriate depth, as verified fluoroscopically.   The guide system for the lag screw was positioned and advanced through an approximately 2 cm stab incision over the lateral aspect of the proximal femur. The guidewire was drilled up through the trochanteric femoral nail and into the femoral neck to rest within 5 mm of subchondral bone. After verifying its position in the femoral neck and head in both AP and lateral projections, the guidewire was measured and found to be optimally replicated by a 100 mm lag screw. The guidewire was overreamed to the appropriate  depth before the lag screw was inserted and advanced to the appropriate depth as verified fluoroscopically in AP and lateral projections. The locking screw was advanced, then backed off a quarter turn to set the lag screw. Again the adequacy of hardware position and fracture reduction was verified fluoroscopically in AP and  lateral projections and found to be excellent.  Attention was directed distally. Using the "perfect circle" technique, the leg and fluoroscopy machine were positioned appropriately. An approximately 1.5 cm stab incision was made over the skin at the appropriate point before the drill bit was advanced through the cortex and across the static hole of the nail. The appropriate length of the screw was determined before the 44 mm distal interlocking screw was positioned, then advanced and tightened securely. Given that this was a subtrochanteric fracture, it was elected to place a second bicortical screw distally. Using the same technique, a 46 mm distal interlocking screw was placed through the dynamic hole. Again the adequacy of screw position was verified fluoroscopically in AP and lateral projections and found to be excellent.  The wounds were irrigated thoroughly with sterile saline solution before the abductor fascia was reapproximated using #1 Vicryl interrupted sutures. The subcutaneous tissues were closed using 2-0 Vicryl interrupted sutures. The skin was closed using staples. A total of 30 cc of 0.5% Sensorcaine with epinephrine was injected in and around all incisions. Sterile occlusive dressings were applied to all wounds before the patient was transferred back to his/her hospital bed. The patient was then transferred to the recovery room in satisfactory condition after tolerating the procedure well.

## 2019-12-14 ENCOUNTER — Encounter: Payer: Self-pay | Admitting: Surgery

## 2019-12-14 DIAGNOSIS — W19XXXA Unspecified fall, initial encounter: Secondary | ICD-10-CM

## 2019-12-14 DIAGNOSIS — Y92009 Unspecified place in unspecified non-institutional (private) residence as the place of occurrence of the external cause: Secondary | ICD-10-CM

## 2019-12-14 DIAGNOSIS — I420 Dilated cardiomyopathy: Secondary | ICD-10-CM

## 2019-12-14 DIAGNOSIS — I351 Nonrheumatic aortic (valve) insufficiency: Secondary | ICD-10-CM

## 2019-12-14 LAB — CBC
HCT: 24.5 % — ABNORMAL LOW (ref 36.0–46.0)
Hemoglobin: 8.4 g/dL — ABNORMAL LOW (ref 12.0–15.0)
MCH: 30.4 pg (ref 26.0–34.0)
MCHC: 34.3 g/dL (ref 30.0–36.0)
MCV: 88.8 fL (ref 80.0–100.0)
Platelets: 205 10*3/uL (ref 150–400)
RBC: 2.76 MIL/uL — ABNORMAL LOW (ref 3.87–5.11)
RDW: 13 % (ref 11.5–15.5)
WBC: 10.6 10*3/uL — ABNORMAL HIGH (ref 4.0–10.5)
nRBC: 0 % (ref 0.0–0.2)

## 2019-12-14 LAB — BASIC METABOLIC PANEL
Anion gap: 7 (ref 5–15)
BUN: 14 mg/dL (ref 8–23)
CO2: 29 mmol/L (ref 22–32)
Calcium: 8.4 mg/dL — ABNORMAL LOW (ref 8.9–10.3)
Chloride: 103 mmol/L (ref 98–111)
Creatinine, Ser: 0.86 mg/dL (ref 0.44–1.00)
GFR calc Af Amer: >60 mL/min (ref >60)
GFR calc non Af Amer: 60 mL/min — ABNORMAL LOW (ref >60)
Glucose, Bld: 138 mg/dL — ABNORMAL HIGH (ref 70–99)
Potassium: 3.6 mmol/L (ref 3.5–5.1)
Sodium: 139 mmol/L (ref 135–145)

## 2019-12-14 NOTE — Progress Notes (Signed)
[facility] Encounter Note  Patient: [name] / Admit Date: 12/12/2019 / Date of Encounter: 12/14/2019, 9:06 AM   Subjective: Patient overall has done very well with surgical intervention of fracture with anesthesia and early rehabilitation.  No evidence of significant changes in EKG rhythm disturbances chest pain shortness of breath or chronic systolic dysfunction congestive heart failure.  Patient remains on home medications for chronic systolic dysfunction heart failure including metoprolol with some bradycardia but no symptoms.  Additionally the patient is remaining on oral Lasix which is appropriate.  Review of Systems: Positive for: None Negative for: Vision change, hearing change, syncope, dizziness, nausea, vomiting,diarrhea, bloody stool, stomach pain, cough, congestion, diaphoresis, urinary frequency, urinary pain,skin lesions, skin rashes Others previously listed  Objective: Telemetry: Sinus bradycardia Physical Exam: Blood pressure (!) 150/65, pulse 68, temperature 98.1 F (36.7 C), temperature source Oral, resp. rate 17, height 5\' 5"  (1.651 m), weight 61.1 kg, SpO2 95 %. Body mass index is 22.42 kg/m. General: Well developed, well nourished, in no acute distress. Head: Normocephalic, atraumatic, sclera non-icteric, no xanthomas, nares are without discharge. Neck: No apparent masses Lungs: Normal respirations with no wheezes, no rhonchi, no rales , no crackles   Heart: Regular rate and rhythm, normal S1 S2, no murmur, no rub, no gallop, PMI is normal size and placement, carotid upstroke normal without bruit, jugular venous pressure normal Abdomen: Soft, non-tender, non-distended with normoactive bowel sounds. No hepatosplenomegaly. Abdominal aorta is normal size without bruit Extremities: Trace edema, no clubbing, no cyanosis, no ulcers,  Peripheral: 2+ radial, 2+ femoral, 2+ dorsal pedal pulses Neuro: Alert and not oriented. Moves all  extremities spontaneously. Psych: Does not responds to questions appropriately with a normal affect.   Intake/Output Summary (Last 24 hours) at 12/14/2019 0906 Last data filed at 12/14/2019 0602 Gross per 24 hour  Intake 556.06 ml  Output 575 ml  Net -18.94 ml    Inpatient Medications:   acetaminophen  500 mg Oral Q6H   calcium-vitamin D  1 tablet Oral Daily   Chlorhexidine Gluconate Cloth  6 each Topical Daily   docusate sodium  100 mg Oral BID   enoxaparin (LOVENOX) injection  40 mg Subcutaneous Q24H   feeding supplement  1 Container Oral TID BM   furosemide  20 mg Oral Daily   levothyroxine  75 mcg Oral Q0600   memantine  10 mg Oral BID   metoprolol succinate  25 mg Oral Q0600   metoprolol succinate  50 mg Oral Q2200   multivitamin with minerals  1 tablet Oral Daily   Infusions:   sodium chloride 50 mL/hr at 12/14/19 0200    Labs: Recent Labs    12/13/19 0545 12/14/19 0503  NA 140 139  K 3.6 3.6  CL 102 103  CO2 30 29  GLUCOSE 166* 138*  BUN 12 14  CREATININE 0.66 0.86  CALCIUM 8.4* 8.4*   No results for input(s): AST, ALT, ALKPHOS, BILITOT, PROT, ALBUMIN in the last 72 hours. Recent Labs    12/12/19 2045 12/12/19 2045 12/13/19 0545 12/14/19 0503  WBC 9.4   < > 10.5 10.6*  NEUTROABS 5.5  --   --   --   HGB 12.1   < > 10.6* 8.4*  HCT 36.6   < > 32.2* 24.5*  MCV 89.7   < > 91.5 88.8  PLT 258   < > 220 205   < > = values in this interval not displayed.   No results for  input(s): CKTOTAL, CKMB, TROPONINI in the last 72 hours. Invalid input(s): POCBNP No results for input(s): HGBA1C in the last 72 hours.   Weights: Filed Weights   12/12/19 2035  Weight: 61.1 kg     Radiology/Studies:  DG Chest 1 View  Result Date: 12/12/2019 CLINICAL DATA:  Fall EXAM: CHEST  1 VIEW COMPARISON:  None. FINDINGS: Diffuse bilateral reticular opacity greatest at the bases and periphery suggesting pulmonary fibrosis. No acute consolidation, pleural effusion  or pneumothorax. Cardiac size within normal limits. Retrocardiac opacity, question hiatal hernia. Mild convex right paratracheal opacity. No pneumothorax. Possible left mid clavicle deformity but limited evaluation secondary to osseous overlap. IMPRESSION: 1. Findings suspicious for pulmonary fibrosis. No acute infiltrate, edema or pneumothorax. 2. Possible fracture deformity of the mid left clavicle, age indeterminate. Consider dedicated left clavicle views if painful here. 3. Possible hiatal hernia. 4. Slight convex opacity in the right paratracheal region, likely augmented by rotation and position, recommend dedicated two view chest to assess for persistence. Electronically Signed   By: Jasmine Pang M.D.   On: 12/12/2019 21:23   CT Head Wo Contrast  Result Date: 12/12/2019 CLINICAL DATA:  Unwitnessed fall. EXAM: CT HEAD WITHOUT CONTRAST TECHNIQUE: Contiguous axial images were obtained from the base of the skull through the vertex without intravenous contrast. COMPARISON:  December 23, 2011 FINDINGS: Brain: There is mild cerebral atrophy with widening of the extra-axial spaces and ventricular dilatation. There are areas of decreased attenuation within the white matter tracts of the supratentorial brain, consistent with microvascular disease changes. Vascular: No hyperdense vessel or unexpected calcification. Skull: Normal. Negative for fracture or focal lesion. Sinuses/Orbits: No acute finding. Other: None. IMPRESSION: 1. Generalized cerebral atrophy. 2. No acute intracranial abnormality. Electronically Signed   By: Aram Candela M.D.   On: 12/12/2019 21:32   CT Cervical Spine Wo Contrast  Result Date: 12/12/2019 CLINICAL DATA:  Status post fall. EXAM: CT CERVICAL SPINE WITHOUT CONTRAST TECHNIQUE: Multidetector CT imaging of the cervical spine was performed without intravenous contrast. Multiplanar CT image reconstructions were also generated. COMPARISON:  None. FINDINGS: Alignment: Normal. Skull base  and vertebrae: No acute fracture. No primary bone lesion or focal pathologic process. Soft tissues and spinal canal: No prevertebral fluid or swelling. No visible canal hematoma. Disc levels: Mild endplate sclerosis is seen at the level of C5-C6. Mild to moderate severity intervertebral disc space narrowing is also seen at this level. Mild intervertebral disc space narrowing is noted throughout the remainder of the cervical spine. Mild to moderate severity bilateral multilevel facet joint hypertrophy is noted. Upper chest: Negative. Other: None. IMPRESSION: 1. Mild to moderate severity degenerative changes of the cervical spine, most prominent at the level of C5-C6. 2. No evidence of an acute fracture or subluxation. Electronically Signed   By: Aram Candela M.D.   On: 12/12/2019 21:35   DG HIP OPERATIVE UNILAT W OR W/O PELVIS RIGHT  Result Date: 12/13/2019 CLINICAL DATA:  Proximal right femoral fracture EXAM: OPERATIVE RIGHT HIP WITH PELVIS COMPARISON:  12/12/2019 FLUOROSCOPY TIME:  Radiation Exposure Index (as provided by the fluoroscopic device): 12.2 mGy If the device does not provide the exposure index: Fluoroscopy Time:  1 minutes 12 seconds Number of Acquired Images:  4 FINDINGS: Four spot films were obtained intraoperatively and reveal a medullary rod in the right femur. Fracture fragments have been reduced. Proximal and distal fixation screws are noted. IMPRESSION: Status post ORIF of proximal right femoral fracture. Electronically Signed   By: Eulah Pont.D.  On: 12/13/2019 13:28   DG Hip Unilat W or Wo Pelvis 2-3 Views Right  Result Date: 12/12/2019 CLINICAL DATA:  Fall EXAM: DG HIP (WITH OR WITHOUT PELVIS) 2-3V RIGHT COMPARISON:  None. FINDINGS: There is a comminuted intertrochanteric fracture of the proximal right femur. No dislocation of the femoral head. No other pelvic fracture. IMPRESSION: Comminuted intertrochanteric fracture of the proximal right femur. Electronically Signed   By:  Deatra Robinson M.D.   On: 12/12/2019 21:21     Assessment and Recommendation  86 y.o. female with known chronic systolic dysfunction congestive heart failure hypertension hyperlipidemia bradycardia stable at this time status post orthopedic surgery and no evidence of myocardial infarction. 1.  Continuation of furosemide daily orally at 20 mg each day for chronic systolic dysfunction heart failure 2.  Continue metoprolol for heart dysfunction hypertension controlled 3.  No further cardiac diagnostics necessary at this time 4.  Begin ambulation and physical rehabilitation for treatment of recent surgery without restriction at this time.  No further evaluation is needed from the cardiac standpoint and if ambulating well okay for discharge to home or rehabilitation as needed.  Please call if further questions otherwise assuming patient has been discharged  Signed, Arnoldo Hooker M.D. FACC

## 2019-12-14 NOTE — Progress Notes (Signed)
PROGRESS NOTE    [name]  WUJ:[id number] DOB: 12-04-1927 DOA: 12/12/2019 PCP: Jamelle HaringHendrickson, Clifford D, MD    Brief Narrative:  Deborah Coffineraldine N Griffinis a 86 y.o.femalewith medical history significant fordementia, chronic systolic heart failure, with dilated cardiomyopathy, last EF 25 to 30% 04/01/2018, nonrheumatic aortic insufficiency, HTN, HLD who suffered an unwitnessed fall in her home. According to granddaughter, sheheard a thud in the kitchenand went and saw her on the floor.She stated that a chair was out of place so it appeared that she tripped over the chair.The fire department came to help her up and assisted her up she appeared to have pain on the right hip and they brought her to the emergency room for evaluation. History is limited due to Alzheimer's dementia.    Consultants:   orthopedics  Procedures:   Antimicrobials:      Subjective: Has no complaints. Reports "feeling sore". No sob.  Objective: Vitals:   12/13/19 1841 12/14/19 0022 12/14/19 0414 12/14/19 0738  BP: (!) 137/59 (!) 126/45 (!) 122/42 (!) 150/65  Pulse: 78 77 74 68  Resp: 15 16 16 17   Temp: (!) 97.5 F (36.4 C) 98.2 F (36.8 C) 98.4 F (36.9 C) 98.1 F (36.7 C)  TempSrc: Oral Oral Oral Oral  SpO2: 97% 95% 93% 95%  Weight:      Height:        Intake/Output Summary (Last 24 hours) at 12/14/2019 0847 Last data filed at 12/14/2019 0602 Gross per 24 hour  Intake 556.06 ml  Output 575 ml  Net -18.94 ml   Filed Weights   12/12/19 2035  Weight: 61.1 kg    Examination:  Comfortable, NAD  family at bedside cta no r/r/w Regular s1/s2 no m/r/r Soft benign +bs No edema  Awake and alert x2, not to place. Per family this is her baseline Mood and affect appropriate    Data Reviewed: I have personally reviewed following labs and imaging studies  CBC: Recent Labs  Lab 12/12/19 2045 12/13/19 0545 12/14/19 0503  WBC 9.4 10.5 10.6*  NEUTROABS 5.5  --   --   HGB  12.1 10.6* 8.4*  HCT 36.6 32.2* 24.5*  MCV 89.7 91.5 88.8  PLT 258 220 205   Basic Metabolic Panel: Recent Labs  Lab 12/12/19 2045 12/13/19 0545 12/14/19 0503  NA 140 140 139  K 2.9* 3.6 3.6  CL 101 102 103  CO2 29 30 29   GLUCOSE 143* 166* 138*  BUN 13 12 14   CREATININE 0.73 0.66 0.86  CALCIUM 8.5* 8.4* 8.4*   GFR: Estimated Creatinine Clearance: 39.9 mL/min (by C-G formula based on SCr of 0.86 mg/dL). Liver Function Tests: No results for input(s): AST, ALT, ALKPHOS, BILITOT, PROT, ALBUMIN in the last 168 hours. No results for input(s): LIPASE, AMYLASE in the last 168 hours. No results for input(s): AMMONIA in the last 168 hours. Coagulation Profile: Recent Labs  Lab 12/13/19 0545  INR 0.9   Cardiac Enzymes: No results for input(s): CKTOTAL, CKMB, CKMBINDEX, TROPONINI in the last 168 hours. BNP (last 3 results) No results for input(s): PROBNP in the last 8760 hours. HbA1C: No results for input(s): HGBA1C in the last 72 hours. CBG: No results for input(s): GLUCAP in the last 168 hours. Lipid Profile: No results for input(s): CHOL, HDL, LDLCALC, TRIG, CHOLHDL, LDLDIRECT in the last 72 hours. Thyroid Function Tests: No results for input(s): TSH, T4TOTAL, FREET4, T3FREE, THYROIDAB in the last 72 hours. Anemia Panel: No results for input(s): VITAMINB12, FOLATE, FERRITIN,  TIBC, IRON, RETICCTPCT in the last 72 hours. Sepsis Labs: No results for input(s): PROCALCITON, LATICACIDVEN in the last 168 hours.  Recent Results (from the past 240 hour(s))  Respiratory Panel by RT PCR (Flu A&B, Covid) - Nasopharyngeal Swab     Status: None   Collection Time: 12/12/19  9:42 PM   Specimen: Nasopharyngeal Swab  Result Value Ref Range Status   SARS Coronavirus 2 by RT PCR NEGATIVE NEGATIVE Final    Comment: (NOTE) SARS-CoV-2 target nucleic acids are NOT DETECTED.  The SARS-CoV-2 RNA is generally detectable in upper respiratoy specimens during the acute phase of infection. The  lowest concentration of SARS-CoV-2 viral copies this assay can detect is 131 copies/mL. A negative result does not preclude SARS-Cov-2 infection and should not be used as the sole basis for treatment or other patient management decisions. A negative result may occur with  improper specimen collection/handling, submission of specimen other than nasopharyngeal swab, presence of viral mutation(s) within the areas targeted by this assay, and inadequate number of viral copies (<131 copies/mL). A negative result must be combined with clinical observations, patient history, and epidemiological information. The expected result is Negative.  Fact Sheet for Patients:  https://www.moore.com/  Fact Sheet for Healthcare Providers:  https://www.young.biz/  This test is no t yet approved or cleared by the [place] FDA and  has been authorized for detection and/or diagnosis of SARS-CoV-2 by FDA under an Emergency Use Authorization (EUA). This EUA will remain  in effect (meaning this test can be used) for the duration of the COVID-19 declaration under Section 564(b)(1) of the Act, 21 U.S.C. section 360bbb-3(b)(1), unless the authorization is terminated or revoked sooner.     Influenza A by PCR NEGATIVE NEGATIVE Final   Influenza B by PCR NEGATIVE NEGATIVE Final    Comment: (NOTE) The Xpert Xpress SARS-CoV-2/FLU/RSV assay is intended as an aid in  the diagnosis of influenza from Nasopharyngeal swab specimens and  should not be used as a sole basis for treatment. Nasal washings and  aspirates are unacceptable for Xpert Xpress SARS-CoV-2/FLU/RSV  testing.  Fact Sheet for Patients: https://www.moore.com/  Fact Sheet for Healthcare Providers: https://www.young.biz/  This test is not yet approved or cleared by the [place] FDA and  has been authorized for detection and/or diagnosis of SARS-CoV-2 by  FDA under  an Emergency Use Authorization (EUA). This EUA will remain  in effect (meaning this test can be used) for the duration of the  Covid-19 declaration under Section 564(b)(1) of the Act, 21  U.S.C. section 360bbb-3(b)(1), unless the authorization is  terminated or revoked. Performed at [facility], [address] [place], [place] [address]   Surgical PCR screen     Status: None   Collection Time: 12/13/19  1:22 AM   Specimen: Nasal Mucosa; Nasal Swab  Result Value Ref Range Status   MRSA, PCR NEGATIVE NEGATIVE Final   Staphylococcus aureus NEGATIVE NEGATIVE Final    Comment: (NOTE) The Xpert SA Assay (FDA approved for NASAL specimens in patients 27 years of age and older), is one component of a comprehensive surveillance program. It is not intended to diagnose infection nor to guide or monitor treatment. Performed at [facility], [address], [place], [place] [address]          Radiology Studies: DG Chest 1 View  Result Date: 12/12/2019 CLINICAL DATA:  Fall EXAM: CHEST  1 VIEW COMPARISON:  None. FINDINGS: Diffuse bilateral reticular opacity greatest at the bases and periphery  suggesting pulmonary fibrosis. No acute consolidation, pleural effusion or pneumothorax. Cardiac size within normal limits. Retrocardiac opacity, question hiatal hernia. Mild convex right paratracheal opacity. No pneumothorax. Possible left mid clavicle deformity but limited evaluation secondary to osseous overlap. IMPRESSION: 1. Findings suspicious for pulmonary fibrosis. No acute infiltrate, edema or pneumothorax. 2. Possible fracture deformity of the mid left clavicle, age indeterminate. Consider dedicated left clavicle views if painful here. 3. Possible hiatal hernia. 4. Slight convex opacity in the right paratracheal region, likely augmented by rotation and position, recommend dedicated two view chest to assess for persistence. Electronically Signed   By: Jasmine Pang M.D.   On:  12/12/2019 21:23   CT Head Wo Contrast  Result Date: 12/12/2019 CLINICAL DATA:  Unwitnessed fall. EXAM: CT HEAD WITHOUT CONTRAST TECHNIQUE: Contiguous axial images were obtained from the base of the skull through the vertex without intravenous contrast. COMPARISON:  December 23, 2011 FINDINGS: Brain: There is mild cerebral atrophy with widening of the extra-axial spaces and ventricular dilatation. There are areas of decreased attenuation within the white matter tracts of the supratentorial brain, consistent with microvascular disease changes. Vascular: No hyperdense vessel or unexpected calcification. Skull: Normal. Negative for fracture or focal lesion. Sinuses/Orbits: No acute finding. Other: None. IMPRESSION: 1. Generalized cerebral atrophy. 2. No acute intracranial abnormality. Electronically Signed   By: Aram Candela M.D.   On: 12/12/2019 21:32   CT Cervical Spine Wo Contrast  Result Date: 12/12/2019 CLINICAL DATA:  Status post fall. EXAM: CT CERVICAL SPINE WITHOUT CONTRAST TECHNIQUE: Multidetector CT imaging of the cervical spine was performed without intravenous contrast. Multiplanar CT image reconstructions were also generated. COMPARISON:  None. FINDINGS: Alignment: Normal. Skull base and vertebrae: No acute fracture. No primary bone lesion or focal pathologic process. Soft tissues and spinal canal: No prevertebral fluid or swelling. No visible canal hematoma. Disc levels: Mild endplate sclerosis is seen at the level of C5-C6. Mild to moderate severity intervertebral disc space narrowing is also seen at this level. Mild intervertebral disc space narrowing is noted throughout the remainder of the cervical spine. Mild to moderate severity bilateral multilevel facet joint hypertrophy is noted. Upper chest: Negative. Other: None. IMPRESSION: 1. Mild to moderate severity degenerative changes of the cervical spine, most prominent at the level of C5-C6. 2. No evidence of an acute fracture or  subluxation. Electronically Signed   By: Aram Candela M.D.   On: 12/12/2019 21:35   DG HIP OPERATIVE UNILAT W OR W/O PELVIS RIGHT  Result Date: 12/13/2019 CLINICAL DATA:  Proximal right femoral fracture EXAM: OPERATIVE RIGHT HIP WITH PELVIS COMPARISON:  12/12/2019 FLUOROSCOPY TIME:  Radiation Exposure Index (as provided by the fluoroscopic device): 12.2 mGy If the device does not provide the exposure index: Fluoroscopy Time:  1 minutes 12 seconds Number of Acquired Images:  4 FINDINGS: Four spot films were obtained intraoperatively and reveal a medullary rod in the right femur. Fracture fragments have been reduced. Proximal and distal fixation screws are noted. IMPRESSION: Status post ORIF of proximal right femoral fracture. Electronically Signed   By: Alcide Clever M.D.   On: 12/13/2019 13:28   DG Hip Unilat W or Wo Pelvis 2-3 Views Right  Result Date: 12/12/2019 CLINICAL DATA:  Fall EXAM: DG HIP (WITH OR WITHOUT PELVIS) 2-3V RIGHT COMPARISON:  None. FINDINGS: There is a comminuted intertrochanteric fracture of the proximal right femur. No dislocation of the femoral head. No other pelvic fracture. IMPRESSION: Comminuted intertrochanteric fracture of the proximal right femur. Electronically Signed  By: Deatra Robinson M.D.   On: 12/12/2019 21:21        Scheduled Meds: . acetaminophen  500 mg Oral Q6H  . calcium-vitamin D  1 tablet Oral Daily  . Chlorhexidine Gluconate Cloth  6 each Topical Daily  . docusate sodium  100 mg Oral BID  . enoxaparin (LOVENOX) injection  40 mg Subcutaneous Q24H  . feeding supplement  1 Container Oral TID BM  . furosemide  20 mg Oral Daily  . levothyroxine  75 mcg Oral Q0600  . memantine  10 mg Oral BID  . metoprolol succinate  25 mg Oral Q0600  . metoprolol succinate  50 mg Oral Q2200  . multivitamin with minerals  1 tablet Oral Daily   Continuous Infusions: . sodium chloride 50 mL/hr at 12/14/19 0200    Assessment & Plan:   Principal Problem:    Closed right hip fracture [facility]) Active Problems:   Hypothyroidism   Dementia of the Alzheimer's type without behavioral disturbance (HCC)   HTN (hypertension)   Aortic insufficiency   Chronic systolic heart failure (HCC)   Fall at home, initial encounter   Preoperative clearance   Dilated cardiomyopathy (HCC)    Closed right hip fracture (HCC) Fall at home, initial encounter Preoperative clearance -Fall was unwitnessed, but suspect mechanical.  S/p ORIF Pain and bowel management. PT rec. SNF lovenox for dvt ppx  Urinary Retention-post op Need to make sure has bm. Bladder scan q4 hrs , I/o cath >300   Chronic systolic heart failure (HCC) Aortic insufficiency Dilated cardiomyopathy (HCC) -Last echocardiogram was 04/01/2018 that showed EF 25 to 30% euvolemic on exam, asx. Continue beta blk and lasix Continue monitor vol. Status.    Hypokalemia-supplemented and stable.  Hypothyroidism Continue with synthroid  Dementia of the Alzheimer's type without behavioral disturbance (HCC) Resume Namenda  HTN (hypertension) -Continue metoprolol  Glaucoma -Continue eyedrops   DVT prophylaxis: lovenox Code Status:dnr Family Communication: Grandaughter at bedside  Status is: Inpatient  Remains inpatient appropriate because:Unsafe d/c plan   Dispo: The patient is from: Home              Anticipated d/c is to: SNF              Anticipated d/c date is: 1 day              Patient currently is medically stable to d/c.            LOS: 2 days   Time spent: 35 min with >50% on co    Lynn Ito, MD [facility] Pager [phone]  If 7PM-7AM, please contact night-coverage [email or web address] Password [facility] 12/14/2019, 8:47 AM

## 2019-12-14 NOTE — TOC Initial Note (Signed)
Transition of Care [facility]) - Initial/Assessment Note    Patient Details  Name: [name] MRN: [id number] Date of Birth: [birth year]  Transition of Care [facility]) CM/SW Contact:    Maud Deed, LCSW Phone Number: 12/14/2019, 7:14 PM  Clinical Narrative:                 CSW spoke with pt's daughter and granddaughter about discharge plans and PT recommendations. Family is agreeable to SNF for pt, pt came from home where she had a fall in the home she lives with her daughter and brother. CSW explained the SNF process and answered families questions. Family would like pt to go to [facility] but are open to other facilities within the area. CSW completed FL2 and PASRR review and faxed to surrounding facilities.   TOC will continue to follow  Expected Discharge Plan: Skilled Nursing Facility Barriers to Discharge: Continued Medical Work up   Patient Goals and CMS Choice Patient states their goals for this hospitalization and ongoing recovery are:: Daughter states to return home after rehab CMS Medicare.gov Compare Post Acute Care list provided to:: Patient Represenative (must comment) Choice offered to / list presented to : Adult Children  Expected Discharge Plan and Services Expected Discharge Plan: Skilled Nursing Facility       Living arrangements for the past 2 months: Single Family Home                                      Prior Living Arrangements/Services Living arrangements for the past 2 months: Single Family Home Lives with:: Adult Children, Siblings Patient language and need for interpreter reviewed:: Yes        Need for Family Participation in Patient Care: Yes (Comment) (Dementia) Care giver support system in place?: Yes (comment) (adult children, granddaughter)   Criminal Activity/Legal Involvement Pertinent to Current Situation/Hospitalization: No - Comment as needed  Activities of Daily Living Home Assistive Devices/Equipment: None ADL Screening  (condition at time of admission) Patient's cognitive ability adequate to safely complete daily activities?: Yes Is the patient deaf or have difficulty hearing?: No Does the patient have difficulty seeing, even when wearing glasses/contacts?: No Does the patient have difficulty concentrating, remembering, or making decisions?: Yes Patient able to express need for assistance with ADLs?: No Does the patient have difficulty dressing or bathing?: Yes Independently performs ADLs?: No Communication: Independent Dressing (OT): Needs assistance Is this a change from baseline?: Change from baseline, expected to last <3days Grooming: Needs assistance Is this a change from baseline?: Change from baseline, expected to last <3 days Feeding: Needs assistance Is this a change from baseline?: Change from baseline, expected to last <3 days Bathing: Needs assistance Is this a change from baseline?: Change from baseline, expected to last <3 days Toileting: Needs assistance Is this a change from baseline?: Change from baseline, expected to last <3 days In/Out Bed: Needs assistance Is this a change from baseline?: Change from baseline, expected to last <3 days Walks in Home: Needs assistance Is this a change from baseline?: Change from baseline, expected to last <3 days Does the patient have difficulty walking or climbing stairs?: Yes Weakness of Legs: Both Weakness of Arms/Hands: None  Permission Sought/Granted   Permission granted to share information with : No  Share Information with NAME: Kathie Rhodes     Permission granted to share info w Relationship: daughter  Permission granted to share info  w Contact Information: [phone]  Emotional Assessment Appearance:: Other (Comment Required (unable to assess) Attitude/Demeanor/Rapport: Unable to Assess Affect (typically observed): Unable to Assess Orientation: :  (Disoriented- Dementia) Alcohol / Substance Use: Not Applicable Psych Involvement: No  (comment)  Admission diagnosis:  Fall [W19.XXXA] Closed right hip fracture (HCC) [S72.001A] Fall, initial encounter [W19.XXXA] Closed fracture of right hip, initial encounter [facility]) [S72.001A] Patient Active Problem List   Diagnosis Date Noted  . Closed right hip fracture (HCC) 12/12/2019  . Fall at home, initial encounter 12/12/2019  . Preoperative clearance 12/12/2019  . Chronic systolic heart failure (HCC) 10/12/2019  . At risk for fall due to comorbid condition 10/12/2019  . Senile purpura (HCC) 08/31/2019  . Dilated cardiomyopathy (HCC) 05/03/2019  . Osteopenia 06/22/2017  . HTN (hypertension) 02/10/2017  . Dementia of the Alzheimer's type without behavioral disturbance (HCC) 11/04/2015  . Gradual-onset memory impairment 10/25/2015  . Hypothyroidism 05/07/2015  . Hyperlipidemia 05/07/2015  . Hyperglycemia 05/07/2015  . Aortic insufficiency 06/14/2013   PCP:  Jamelle Haring, MD Pharmacy:   [facility] [address] [place] - [address] [place] [address] Phone: [phone] Fax: [phone]     Social Determinants of Health (SDOH) Interventions    Readmission Risk Interventions No flowsheet data found.

## 2019-12-14 NOTE — Anesthesia Postprocedure Evaluation (Signed)
Anesthesia Post Note  Patient: [name]  Procedure(s) Performed: INTRAMEDULLARY (IM) NAIL INTERTROCHANTRIC (Right Hip)  Patient location during evaluation: Nursing Unit Anesthesia Type: Spinal Level of consciousness: awake, awake and alert and oriented Pain management: pain level controlled Vital Signs Assessment: post-procedure vital signs reviewed and stable Respiratory status: spontaneous breathing, respiratory function stable and nonlabored ventilation Cardiovascular status: blood pressure returned to baseline and stable Postop Assessment: no headache and no backache Anesthetic complications: no   No complications documented.   Last Vitals:  Vitals:   12/14/19 0414 12/14/19 0738  BP: (!) 122/42 (!) 150/65  Pulse: 74 68  Resp: 16 17  Temp: 36.9 C 36.7 C  SpO2: 93% 95%    Last Pain:  Vitals:   12/14/19 0738  TempSrc: Oral  PainSc:                  Ginger Carne

## 2019-12-14 NOTE — [place] FL2 (Signed)
[place] MEDICAID FL2 LEVEL OF CARE SCREENING TOOL     IDENTIFICATION  Patient Name: [name] Birthdate: October 27, 1927 Sex: female Admission Date (Current Location): 12/12/2019  [place] and [place] Number:  Chiropodist and Address:  [facility], [address], [place], [place] [address]      Provider Number: [id number]  Attending Physician Name and Address:  Lynn Ito, MD  Relative Name and Phone Number:  [phone]    Current Level of Care: Hospital Recommended Level of Care: Skilled Nursing Facility Prior Approval Number:    Date Approved/Denied:   PASRR Number: [id number]  Discharge Plan: SNF    Current Diagnoses: Patient Active Problem List   Diagnosis Date Noted  . Closed right hip fracture (HCC) 12/12/2019  . Fall at home, initial encounter 12/12/2019  . Preoperative clearance 12/12/2019  . Chronic systolic heart failure (HCC) 10/12/2019  . At risk for fall due to comorbid condition 10/12/2019  . Senile purpura (HCC) 08/31/2019  . Dilated cardiomyopathy (HCC) 05/03/2019  . Osteopenia 06/22/2017  . HTN (hypertension) 02/10/2017  . Dementia of the Alzheimer's type without behavioral disturbance (HCC) 11/04/2015  . Gradual-onset memory impairment 10/25/2015  . Hypothyroidism 05/07/2015  . Hyperlipidemia 05/07/2015  . Hyperglycemia 05/07/2015  . Aortic insufficiency 06/14/2013    Orientation RESPIRATION BLADDER Height & Weight     Self (Dementia- late stages)  Normal Incontinent Weight: 134 lb 11.2 oz (61.1 kg) Height:  5\' 5"  (165.1 cm)  BEHAVIORAL SYMPTOMS/MOOD NEUROLOGICAL BOWEL NUTRITION STATUS      Incontinent    AMBULATORY STATUS COMMUNICATION OF NEEDS Skin   Extensive Assist Verbally Normal, Surgical wounds (right hip)                       Personal Care Assistance Level of Assistance  Bathing, Feeding, Dressing Bathing Assistance: Maximum assistance Feeding assistance: Limited  assistance Dressing Assistance: Maximum assistance     Functional Limitations Info  Sight, Hearing, Speech Sight Info: Adequate Hearing Info: Adequate Speech Info: Adequate    SPECIAL CARE FACTORS FREQUENCY  PT (By licensed PT), OT (By licensed OT)     PT Frequency: 5x week OT Frequency: 5x week            Contractures Contractures Info: Not present    Additional Factors Info  Code Status Code Status Info: DNR             Current Medications (12/14/2019):  This is the current hospital active medication list Current Facility-Administered Medications  Medication Dose Route Frequency Provider Last Rate Last Admin  . 0.9 %  sodium chloride infusion   Intravenous Continuous Poggi, 12/16/2019, MD 50 mL/hr at 12/14/19 0200 Rate Verify at 12/14/19 0200  . acetaminophen (TYLENOL) tablet 325-650 mg  325-650 mg Oral Q6H PRN Poggi, 12/16/19, MD      . bisacodyl (DULCOLAX) suppository 10 mg  10 mg Rectal Daily PRN Poggi, Excell Seltzer, MD      . calcium-vitamin D (OSCAL WITH D) 500-200 MG-UNIT per tablet 1 tablet  1 tablet Oral Daily Poggi, Excell Seltzer, MD   1 tablet at 12/13/19 1552  . Chlorhexidine Gluconate Cloth 2 % PADS 6 each  6 each Topical Daily 12/15/19, MD   6 each at 12/13/19 1526  . diphenhydrAMINE (BENADRYL) 12.5 MG/5ML elixir 12.5-25 mg  12.5-25 mg Oral Q4H PRN Poggi, 04-15-1984, MD      . docusate sodium (COLACE) capsule 100 mg  100  mg Oral BID Christena Flake, MD   100 mg at 12/14/19 6333  . enoxaparin (LOVENOX) injection 40 mg  40 mg Subcutaneous Q24H Poggi, Excell Seltzer, MD   40 mg at 12/14/19 5456  . feeding supplement (BOOST / RESOURCE BREEZE) liquid 1 Container  1 Container Oral TID BM Lynn Ito, MD   1 Container at 12/14/19 0846  . furosemide (LASIX) tablet 20 mg  20 mg Oral Daily Poggi, Excell Seltzer, MD   20 mg at 12/14/19 2563  . HYDROcodone-acetaminophen (NORCO/VICODIN) 5-325 MG per tablet 1-2 tablet  1-2 tablet Oral Q6H PRN Poggi, Excell Seltzer, MD   1 tablet at 12/12/19 2302  . levothyroxine  (SYNTHROID) tablet 75 mcg  75 mcg Oral Q0600 Christena Flake, MD   75 mcg at 12/14/19 0555  . magnesium hydroxide (MILK OF MAGNESIA) suspension 30 mL  30 mL Oral Daily PRN Poggi, Excell Seltzer, MD      . memantine [facility]) tablet 10 mg  10 mg Oral BID Christena Flake, MD   10 mg at 12/14/19 0837  . metoCLOPramide (REGLAN) tablet 5-10 mg  5-10 mg Oral Q8H PRN Poggi, Excell Seltzer, MD       Or  . metoCLOPramide (REGLAN) injection 5-10 mg  5-10 mg Intravenous Q8H PRN Poggi, Excell Seltzer, MD      . metoprolol succinate (TOPROL-XL) 24 hr tablet 25 mg  25 mg Oral S9373 Lynn Ito, MD   25 mg at 12/14/19 0837  . metoprolol succinate (TOPROL-XL) 24 hr tablet 50 mg  50 mg Oral Q2200 Lynn Ito, MD   50 mg at 12/13/19 2108  . morphine 2 MG/ML injection 0.5 mg  0.5 mg Intravenous Q2H PRN Poggi, Excell Seltzer, MD   0.5 mg at 12/13/19 0437  . multivitamin with minerals tablet 1 tablet  1 tablet Oral Daily Lynn Ito, MD   1 tablet at 12/14/19 [phone]  . ondansetron (ZOFRAN) tablet 4 mg  4 mg Oral Q6H PRN Poggi, Excell Seltzer, MD       Or  . ondansetron (ZOFRAN) injection 4 mg  4 mg Intravenous Q6H PRN Poggi, Excell Seltzer, MD      . sodium phosphate (FLEET) 7-19 GM/118ML enema 1 enema  1 enema Rectal Once PRN Poggi, Excell Seltzer, MD      . traMADol Janean Sark) tablet 50 mg  50 mg Oral Q6H PRN Poggi, Excell Seltzer, MD   50 mg at 12/14/19 1428     Discharge Medications: Please see discharge summary for a list of discharge medications.  Relevant Imaging Results:  Relevant Lab Results:   Additional Information SS: [id number]  Maud Deed, LCSW

## 2019-12-14 NOTE — Evaluation (Signed)
Physical Therapy Evaluation Patient Details Name: [name] MRN: [id number] DOB: [birth year] Today's Date: 12/14/2019   History of Present Illness  [name] is a 86 year old female with a history of HTN, osteoporosis, hypothyrodism, and dementia. She was admitted after a mechanical fall and landed on her R side. X-rays demonstrated a discplaced subtrochanteric fx of the R hip. She is status post ORIF performed 12/13/19.     Clinical Impression  Pt presents in bed on arrival to room with daughter and granddaughter bedside. She is agreeable to PT evaluation. Patient is A&Ox1 as she is only oriented to herself, she is able to answer simple questions about her house and family is present to verify information. She in unaware that she had surgery on R hip and keeps saying, "it's my bad leg, don't get old." Pt denies any pain upon rest, but with movement she notes soreness in RLE. Pt able to come sit EOB with minA+1 with RLE and VC/tactiles cues for hand placement. She is able to bring herself up on her own and scoot forward so her feet are touching the floor. Granddaughter notes that this is her baseline as she takes increased time at home, but able to do it herself. Pt instructed how to use RW with demonstration before using it for gait. Sit to stand transfer attempted twice. The first time was with RW but pt RLE buckling and unable to stand up straight. The second time was using stand pivot technique blocking RLE. Pt cued to stand up straight multiple times as her knee keeps buckling. Pt able to take a couple of steps over to recliner chair but demonstrates decreased strength and balance. She used shuffling technique and weight shift between feet, however, RLE buckles under her with each step. Once seated in chair, performed exercises seated where she is left with all needs. Pt will benefit from skilled PT services and SNF upon discharge to address deficits in strength, balance, and decrease  risk for future falls.    Follow Up Recommendations SNF    Equipment Recommendations  Rolling walker with 5" wheels    Recommendations for Other Services       Precautions / Restrictions Precautions Precautions: Fall Restrictions Weight Bearing Restrictions: No      Mobility  Bed Mobility Overal bed mobility: Needs Assistance Bed Mobility: Supine to Sit     Supine to sit: Min assist     General bed mobility comments: Pt able to come sit EOB with min assist with RLE and VCs/tactile cues for hand placement. Pt able to bring herself up on her own and scoot forward so her feet are touching the floor  Transfers Overall transfer level: Needs assistance Equipment used: Rolling walker (2 wheeled);None Transfers: Sit to/from [employer] Sit to Stand: Mod assist;+2 physical assistance Stand pivot transfers: Mod assist;+2 physical assistance       General transfer comment: Attempted sit to stand with RW but pt RLE buckling and unable to stand up straight. Performed transfer again this time stand pivot blocking RLE. Pt cued to stand up straight multiple times as her knee keeps buckling  Ambulation/Gait Ambulation/Gait assistance: Mod assist;+2 physical assistance Gait Distance (Feet): 2 Feet Assistive device: None Gait Pattern/deviations: Step-to pattern;Decreased step length - right;Decreased step length - left;Shuffle     General Gait Details: Pt able to shuffle her feet from the bed to the recliner chair with modA+2 and cues to stand up straight during process. Pt able to shift  weight between feet, but RLE buckling under her with each step  Stairs            Wheelchair Mobility    Modified Rankin (Stroke Patients Only)       Balance Overall balance assessment: Needs assistance Sitting-balance support: Bilateral upper extremity supported;Feet supported Sitting balance-Leahy Scale: Poor Sitting balance - Comments: Pt able to maintain balance but  needs both UE supported with a R lateral/posterior lean. She is unable to maintain balance when reaching within BOS Postural control: Posterior lean;Right lateral lean Standing balance support: Bilateral upper extremity supported Standing balance-Leahy Scale: Poor Standing balance comment: Pt needs bilateral UE supported and unable to stand up straight without VCs. Pt's R knee buckles under when she stands up due to weakness, so assistance needed to block R knee                             Pertinent Vitals/Pain Pain Assessment: Faces Faces Pain Scale: Hurts a little bit (No pain at rest, but notes soreness when moving) Pain Location: R hip Pain Descriptors / Indicators: Sore Pain Intervention(s): Premedicated before session;Monitored during session;Limited activity within patient's tolerance    Home Living Family/patient expects to be discharged to:: Private residence Living Arrangements: Other relatives Available Help at Discharge: Family;Available PRN/intermittently Type of Home: House Home Access: Stairs to enter Entrance Stairs-Rails: Left (Bad rail, not most supportive) Entrance Stairs-Number of Steps: 2 Home Layout: One level Home Equipment: Cane - single point Additional Comments: Pt lives with son and grandaughter, but they are at work during the day with no other help available. Pt has SPC but doesn't use it    Prior Function Level of Independence: Independent         Comments: Pt able to get dressed by herself and do simple ADLs but unable to stand up without assistance. She has only fallen a couple of times but has a lot of stumbles. This fall that caused hip fx is the only one she has gotten injured.      Hand Dominance        Extremity/Trunk Assessment   Upper Extremity Assessment Upper Extremity Assessment: Overall WFL for tasks assessed (Generally 4 to 4+/5 strength in UE)    Lower Extremity Assessment Lower Extremity Assessment: Generalized  weakness;RLE deficits/detail (LLE generally 3+ to 4/5) RLE Deficits / Details: Surgery on R hip and unable to assess strength in RLE RLE: Unable to fully assess due to pain RLE Sensation: WNL       Communication   Communication: No difficulties  Cognition Arousal/Alertness: Awake/alert Behavior During Therapy: WFL for tasks assessed/performed Overall Cognitive Status: History of cognitive impairments - at baseline                                 General Comments: Pt has dementia. She is only oriented to herself, but able to follow short commands.      General Comments      Exercises General Exercises - Lower Extremity Ankle Circles/Pumps: AROM;Both;10 reps;Supine Quad Sets: AROM;5 reps;Both;Seated Gluteal Sets: AROM;Both;5 reps;Seated Heel Slides: AROM;Both;5 reps;Supine Hip ABduction/ADduction: AROM;Right;5 reps;Supine;Seated Straight Leg Raises: AROM;Both;5 reps;Supine Hip Flexion/Marching: AROM;Both;Other reps (comment);Seated (2 reps)   Assessment/Plan    PT Assessment Patient needs continued PT services  PT Problem List Decreased strength;Decreased range of motion;Decreased activity tolerance;Decreased balance;Decreased mobility;Decreased coordination;Decreased knowledge of use of DME;Decreased safety  awareness;Pain       PT Treatment Interventions DME instruction;Gait training;Stair training;Functional mobility training;Therapeutic activities;Therapeutic exercise;Balance training;Neuromuscular re-education    PT Goals (Current goals can be found in the Care Plan section)  Acute Rehab PT Goals Patient Stated Goal: "to go home" PT Goal Formulation: Patient unable to participate in goal setting Time For Goal Achievement: 12/28/19 Potential to Achieve Goals: Fair    Frequency BID   Barriers to discharge Decreased caregiver support Patient lives with family who are not around 24/7 due to work    Co-evaluation               AM-PAC PT "6  Clicks" Mobility  Outcome Measure Help needed turning from your back to your side while in a flat bed without using bedrails?: A Lot Help needed moving from lying on your back to sitting on the side of a flat bed without using bedrails?: A Lot Help needed moving to and from a bed to a chair (including a wheelchair)?: Total Help needed standing up from a chair using your arms (e.g., wheelchair or bedside chair)?: Total Help needed to walk in hospital room?: Total Help needed climbing 3-5 steps with a railing? : Total 6 Click Score: 8    End of Session Equipment Utilized During Treatment: Gait belt Activity Tolerance: Patient tolerated treatment well Patient left: in chair;with call bell/phone within reach;with chair alarm set;with family/visitor present Nurse Communication: Mobility status PT Visit Diagnosis: Unsteadiness on feet (R26.81);Other abnormalities of gait and mobility (R26.89);Repeated falls (R29.6);Muscle weakness (generalized) (M62.81);History of falling (Z91.81);Ataxic gait (R26.0);Difficulty in walking, not elsewhere classified (R26.2);Pain Pain - Right/Left: Right Pain - part of body: Hip    Time: 0912-0946 PT Time Calculation (min) (ACUTE ONLY): 34 min   Charges:              Katherine Basset, SPT Baker Pierini 12/14/2019, 10:49 AM

## 2019-12-14 NOTE — Progress Notes (Signed)
Physical Therapy Treatment Patient Details Name: [name] MRN: [id number] DOB: 1927-08-09 Today's Date: 12/14/2019    History of Present Illness  Lunette Tapp is a 86 year old female with a history of HTN, osteoporosis, hypothyrodism, and dementia. She was admitted after a mechanical fall and landed on her R side. X-rays demonstrated a discplaced subtrochanteric fx of the R hip. She is status post ORIF performed 12/13/19.     PT Comments    Pt presents in bed on arrival to room with daughter and granddaughter bedside. She is agreeable to PT treatment. Performed supine exercises in bed to strength RLE before sitting EOB. Pt sits EOB with modA+2 for assistance with trunk and LEs. She needed help scooting forward to be in better position EOB. She performed 2 sit to stands with maxA+1. She pushed from the bed rail to stand up and was able to stand up straight for 5 seconds before needing to sit back down. Pt's RLE still buckling so blocking was needed for LE.Marland Kitchen Pt notes pain and soreness in RLE throughout session. Pt will benefit from skilled PT services and SNF upon discharge to address deficits in strength, balance, and decrease risk for future falls.   Follow Up Recommendations  SNF     Equipment Recommendations  Rolling walker with 5" wheels    Recommendations for Other Services       Precautions / Restrictions Precautions Precautions: Fall Restrictions Weight Bearing Restrictions: Yes RLE Weight Bearing: Weight bearing as tolerated    Mobility  Bed Mobility Overal bed mobility: Needs Assistance Bed Mobility: Supine to Sit;Sit to Supine     Supine to sit: +2 for physical assistance;Mod assist Sit to supine: Mod assist;+2 for physical assistance   General bed mobility comments: Pt was modA+2 with assistance with trunk and LE to sit EOB. Pt attempted but having difficulty sitting up herself and seemed confused what we were asking her.   Transfers Overall  transfer level: Needs assistance Equipment used: None Transfers: Sit to/from Stand Sit to Stand: Max assist         General transfer comment: Pt performed 2 sit to stands with maxA+1. Pt able to push up with bed rail to stand up and once standing, she was able to stand up straight. Pt's RLE still buckling so blocking was needed for LE. She was able to stand for 5 seconds before needing to sit back down.  Ambulation/Gait             General Gait Details: Did not attempt due to pt being weak, fatigued, and in pain.   Stairs             Wheelchair Mobility    Modified Rankin (Stroke Patients Only)       Balance Overall balance assessment: Needs assistance Sitting-balance support: Bilateral upper extremity supported;Feet supported Sitting balance-Leahy Scale: Poor Sitting balance - Comments: Pt able to maintain balance but needs both UE supported with a R lateral/posterior lean. She is unable to maintain balance when reaching within BOS Postural control: Posterior lean;Right lateral lean Standing balance support: Bilateral upper extremity supported Standing balance-Leahy Scale: Poor Standing balance comment: Pt needs bilateral UE supported and pt able to stand up straight with VCs. Pt's R knee buckles under when she stands up due to weakness, so assistance needed to block R knee  Cognition Arousal/Alertness: Awake/alert Behavior During Therapy: WFL for tasks assessed/performed Overall Cognitive Status: History of cognitive impairments - at baseline                                 General Comments: Pt has dementia and seemed more confused this afternoon then this morning.      Exercises General Exercises - Lower Extremity Ankle Circles/Pumps: AROM;Strengthening;Both;5 reps;Supine (manual resistance on eccentric motion) Quad Sets: AROM;Right;Both;5 reps;Supine Short Arc Quad: AROM;Both;10 reps;Supine Heel Slides:  AAROM;Both;10 reps;Supine Hip ABduction/ADduction: AAROM;Both;10 reps;Supine Straight Leg Raises: AAROM;Both;5 reps;Supine    General Comments        Pertinent Vitals/Pain Pain Assessment: Faces Faces Pain Scale: Hurts even more Pain Location: R hip Pain Descriptors / Indicators: Sore Pain Intervention(s): Limited activity within patient's tolerance;Premedicated before session    Home Living                      Prior Function            PT Goals (current goals can now be found in the care plan section) Acute Rehab PT Goals Patient Stated Goal: "to go home" PT Goal Formulation: Patient unable to participate in goal setting Time For Goal Achievement: 12/28/19 Potential to Achieve Goals: Fair Progress towards PT goals: Progressing toward goals    Frequency    BID      PT Plan Current plan remains appropriate    Co-evaluation              AM-PAC PT "6 Clicks" Mobility   Outcome Measure  Help needed turning from your back to your side while in a flat bed without using bedrails?: A Lot Help needed moving from lying on your back to sitting on the side of a flat bed without using bedrails?: A Lot Help needed moving to and from a bed to a chair (including a wheelchair)?: Total Help needed standing up from a chair using your arms (e.g., wheelchair or bedside chair)?: Total Help needed to walk in hospital room?: Total Help needed climbing 3-5 steps with a railing? : Total 6 Click Score: 8    End of Session Equipment Utilized During Treatment: Gait belt Activity Tolerance: Patient tolerated treatment well Patient left: in bed;with call bell/phone within reach;with family/visitor present Nurse Communication: Mobility status PT Visit Diagnosis: Unsteadiness on feet (R26.81);Other abnormalities of gait and mobility (R26.89);Repeated falls (R29.6);Muscle weakness (generalized) (M62.81);History of falling (Z91.81);Ataxic gait (R26.0);Difficulty in walking, not  elsewhere classified (R26.2);Pain Pain - Right/Left: Right Pain - part of body: Leg     Time: 3151-7616 PT Time Calculation (min) (ACUTE ONLY): 28 min  Charges:                         Katherine Basset, SPT [facility] 12/14/2019, 3:54 PM

## 2019-12-15 DIAGNOSIS — D649 Anemia, unspecified: Secondary | ICD-10-CM

## 2019-12-15 LAB — BASIC METABOLIC PANEL
Anion gap: 5 (ref 5–15)
BUN: 14 mg/dL (ref 8–23)
CO2: 29 mmol/L (ref 22–32)
Calcium: 8.5 mg/dL — ABNORMAL LOW (ref 8.9–10.3)
Chloride: 102 mmol/L (ref 98–111)
Creatinine, Ser: 0.78 mg/dL (ref 0.44–1.00)
GFR calc Af Amer: >60 mL/min (ref >60)
GFR calc non Af Amer: >60 mL/min (ref >60)
Glucose, Bld: 141 mg/dL — ABNORMAL HIGH (ref 70–99)
Potassium: 2.8 mmol/L — ABNORMAL LOW (ref 3.5–5.1)
Sodium: 136 mmol/L (ref 135–145)

## 2019-12-15 LAB — CBC
HCT: 22.5 % — ABNORMAL LOW (ref 36.0–46.0)
Hemoglobin: 7.4 g/dL — ABNORMAL LOW (ref 12.0–15.0)
MCH: 30.6 pg (ref 26.0–34.0)
MCHC: 32.9 g/dL (ref 30.0–36.0)
MCV: 93 fL (ref 80.0–100.0)
Platelets: 186 10*3/uL (ref 150–400)
RBC: 2.42 MIL/uL — ABNORMAL LOW (ref 3.87–5.11)
RDW: 12.9 % (ref 11.5–15.5)
WBC: 10 10*3/uL (ref 4.0–10.5)
nRBC: 0 % (ref 0.0–0.2)

## 2019-12-15 MED ORDER — POLYETHYLENE GLYCOL 3350 17 G PO PACK
17.0000 g | PACK | Freq: Every day | ORAL | Status: DC
  Administered 2019-12-15 – 2019-12-18 (×4): 17 g via ORAL
  Filled 2019-12-15 (×4): qty 1

## 2019-12-15 MED ORDER — ENOXAPARIN SODIUM 40 MG/0.4ML ~~LOC~~ SOLN: 40.0000 mg | SUBCUTANEOUS | 0 refills | Status: DC

## 2019-12-15 MED ORDER — POTASSIUM CHLORIDE 20 MEQ PO PACK
40.0000 meq | PACK | Freq: Two times a day (BID) | ORAL | Status: AC
  Administered 2019-12-15 (×2): 40 meq via ORAL
  Filled 2019-12-15 (×2): qty 2

## 2019-12-15 MED ORDER — SENNA 8.6 MG PO TABS
2.0000 | ORAL_TABLET | Freq: Every day | ORAL | Status: DC
  Administered 2019-12-15 – 2019-12-18 (×4): 17.2 mg via ORAL
  Filled 2019-12-15 (×4): qty 2

## 2019-12-15 MED ORDER — BISACODYL 10 MG RE SUPP
10.0000 mg | Freq: Once | RECTAL | Status: DC
  Filled 2019-12-15: qty 1

## 2019-12-15 MED ORDER — TRAMADOL HCL 50 MG PO TABS
50.0000 mg | ORAL_TABLET | Freq: Four times a day (QID) | ORAL | 0 refills | Status: DC | PRN
Start: 2019-12-15 — End: 2020-01-22

## 2019-12-15 NOTE — Evaluation (Signed)
Occupational Therapy Evaluation Patient Details Name: [name] MRN: [id number] DOB: 01/04/1928 Today's Date: 12/15/2019    History of Present Illness  [name] is a 86 year old female with a history of HTN, osteoporosis, hypothyrodism, and dementia. She was admitted after a mechanical fall and landed on her R side. X-rays demonstrated a discplaced subtrochanteric fx of the R hip. She is status post ORIF performed 12/13/19.    Clinical Impression   [name] was seen for OT evaluation this date. Prior to hospital admission, pt was MOD I for ADLs and required assist for mobility (refused to use SPC, frequent stumbles). Pt lives c son and granddaughter available PRN in 1 level home. Pt presents to acute OT demonstrating impaired ADL performance, functional cognition, and functional mobility 2/2 decreased safety awareness, functional strength/ROM/balance deficits, pain, and decreased activity tolerance. Pt oriented to self only - knows she is not at home and is waiting to see a doctor but unable to state location. Pt currently requires MOD I for self-feeding finger foods at bed level. MAX A for LBD at bed level. MAX A + VCs for L rolling. Pt would benefit from skilled OT to address noted impairments and functional limitations (see below for any additional details) in order to maximize safety and independence while minimizing falls risk and caregiver burden. Upon hospital discharge, recommend STR to maximize pt safety and return to PLOF.     Follow Up Recommendations  SNF    Equipment Recommendations  3 in 1 bedside commode    Recommendations for Other Services       Precautions / Restrictions Precautions Precautions: Fall Restrictions Weight Bearing Restrictions: Yes RLE Weight Bearing: Weight bearing as tolerated      Mobility Bed Mobility Overal bed mobility: Needs Assistance Bed Mobility: Rolling Rolling: Max assist     General bed mobility comments: VCs for  sequencing log roll, MAX A to achieve sidelying   Transfers   General transfer comment: Not attempted        ADL either performed or assessed with clinical judgement   ADL Overall ADL's : Needs assistance/impaired        General ADL Comments: MOD I for self-feeding finger foods at bed level. MAX A for LBD at bed level. Anticipate +2 for toileting bed level                   Pertinent Vitals/Pain Pain Assessment: Faces Faces Pain Scale: Hurts whole lot Pain Location: R LE Pain Descriptors / Indicators: Aching;Grimacing;Operative site guarding;Moaning Pain Intervention(s): Limited activity within patient's tolerance;Repositioned;Premedicated before session     Hand Dominance Right   Extremity/Trunk Assessment Upper Extremity Assessment Upper Extremity Assessment: Overall WFL for tasks assessed   Lower Extremity Assessment Lower Extremity Assessment: RLE deficits/detail;Generalized weakness RLE: Unable to fully assess due to pain       Communication Communication Communication: No difficulties   Cognition Arousal/Alertness: Awake/alert Behavior During Therapy: WFL for tasks assessed/performed Overall Cognitive Status: History of cognitive impairments - at baseline        General Comments: Dementia at basline - per DTR/gddtr in room pt typically knows location/place. Currently knows she is not at home and is waiting to see a doctor.   General Comments       Exercises Exercises: Other exercises General Exercises - Lower Extremity Ankle Circles/Pumps: AROM;Both;10 reps;Supine Other Exercises Other Exercises: Pt and family educated re: OT role, DME recs, d/c recs, IS (frequency and use), pain mgmt techniques Other  Exercises: Self-feeding, LBD, rolling, bed mobility    Shoulder Instructions      Home Living Family/patient expects to be discharged to:: Private residence Living Arrangements: Other relatives Available Help at Discharge: Family;Available  PRN/intermittently Type of Home: House Home Access: Stairs to enter [employer] of Steps: 2   Home Layout: One level     Bathroom Shower/Tub: Tub/shower unit (Pt doesnt use)   Bathroom Toilet: Standard Bathroom Accessibility: Yes   Home Equipment: Cane - single point   Additional Comments: Pt lives with son and grandaughter, but they are at work during the day with no other help available. Pt has SPC but doesn't use it      Prior Functioning/Environment Level of Independence: Independent        Comments: SUP for ADLs, sponge bathes in standing at sink. Refuses to use SPC and stumbles if no assist for mobility. Dementia more limiting ADLs than physical strength. She has fallen a couple of times but this is the only one she has gotten injured.         OT Problem List: Decreased strength;Decreased range of motion;Decreased activity tolerance;Impaired balance (sitting and/or standing);Decreased knowledge of use of DME or AE;Decreased safety awareness;Pain      OT Treatment/Interventions: Therapeutic exercise;Self-care/ADL training;Energy conservation;DME and/or AE instruction;Therapeutic activities;Patient/family education;Balance training    OT Goals(Current goals can be found in the care plan section) Acute Rehab OT Goals Patient Stated Goal: "to go home" OT Goal Formulation: With patient/family Time For Goal Achievement: 12/29/19 Potential to Achieve Goals: Fair ADL Goals Pt Will Perform Grooming: with min assist;sitting Pt Will Perform Lower Body Dressing: with mod assist;bed level Pt Will Transfer to Toilet: with max assist;squat pivot transfer;with transfer board;bedside commode (c LRAD PRN)  OT Frequency: Min 2X/week   Barriers to D/C: Inaccessible home environment;Decreased caregiver support             AM-PAC OT "6 Clicks" Daily Activity     Outcome Measure Help from another person eating meals?: None Help from another person taking care of  personal grooming?: A Little Help from another person toileting, which includes using toliet, bedpan, or urinal?: A Lot Help from another person bathing (including washing, rinsing, drying)?: A Lot Help from another person to put on and taking off regular upper body clothing?: A Little Help from another person to put on and taking off regular lower body clothing?: A Lot 6 Click Score: 16   End of Session    Activity Tolerance: Patient limited by pain Patient left: in bed;with call bell/phone within reach;with family/visitor present  OT Visit Diagnosis: Other abnormalities of gait and mobility (R26.89);Repeated falls (R29.6)                Time: 9563-8756 OT Time Calculation (min): 22 min Charges:  OT General Charges $OT Visit: 1 Visit OT Evaluation $OT Eval Low Complexity: 1 Low OT Treatments $Self Care/Home Management : 8-22 mins  Kathie Dike, M.S. OTR/L  12/15/19, 3:48 PM  ascom [phone]

## 2019-12-15 NOTE — Progress Notes (Signed)
  Subjective: 2 Days Post-Op Procedure(s) (LRB): INTRAMEDULLARY (IM) NAIL INTERTROCHANTRIC (Right) Patient does not give a pain score this AM. Patient is well, and has had no acute complaints or problems Continue to work with PT, will need SNF upon discharge. Negative for chest pain and shortness of breath Fever: no Gastrointestinal:Negative for nausea and vomiting  Objective: Vital signs in last 24 hours: Temp:  [97.8 F (36.6 C)] 97.8 F (36.6 C) (10/01 0740) Pulse Rate:  [64-74] 64 (10/01 0740) Resp:  [15-17] 15 (10/01 0740) BP: (121-134)/(35) 121/35 (10/01 0740) SpO2:  [94 %-95 %] 94 % (10/01 0740)  Intake/Output from previous day:  Intake/Output Summary (Last 24 hours) at 12/15/2019 0749 Last data filed at 12/15/2019 0700 Gross per 24 hour  Intake 240 ml  Output 700 ml  Net -460 ml    Intake/Output this shift: No intake/output data recorded.  Labs: Recent Labs    12/12/19 2045 12/13/19 0545 12/14/19 0503 12/15/19 0549  HGB 12.1 10.6* 8.4* 7.4*   Recent Labs    12/14/19 0503 12/15/19 0549  WBC 10.6* 10.0  RBC 2.76* 2.42*  HCT 24.5* 22.5*  PLT 205 186   Recent Labs    12/14/19 0503 12/15/19 0549  NA 139 136  K 3.6 2.8*  CL 103 102  CO2 29 29  BUN 14 14  CREATININE 0.86 0.78  GLUCOSE 138* 141*  CALCIUM 8.4* 8.5*   Recent Labs    12/13/19 0545  INR 0.9     EXAM General - Patient is alert but drowsy this AM.  Can be awaken when spoken to. Extremity - ABD soft Sensation intact distally Dorsiflexion/Plantar flexion intact Incision: dressing C/D/I No cellulitis present Dressing/Incision - clean, dry, no drainage Motor Function - intact, moving foot and toes well on exam.  Bowel sounds intact, abdomen soft to palpation.  Past Medical History:  Diagnosis Date  . Dementia (HCC)   . Glaucoma   . HTN (hypertension) 02/10/2017  . Osteoporosis   . Thyroid disease     Assessment/Plan: 2 Days Post-Op Procedure(s) (LRB): INTRAMEDULLARY (IM)  NAIL INTERTROCHANTRIC (Right) Principal Problem:   Closed right hip fracture (HCC) Active Problems:   Hypothyroidism   Dementia of the Alzheimer's type without behavioral disturbance (HCC)   HTN (hypertension)   Aortic insufficiency   Chronic systolic heart failure (HCC)   Fall at home, initial encounter   Preoperative clearance   Dilated cardiomyopathy (HCC)  Estimated body mass index is 22.42 kg/m as calculated from the following:   Height as of this encounter: 5\' 5"  (1.651 m).   Weight as of this encounter: 61.1 kg. Advance diet Up with therapy D/C IV fluids  When tolerating po intake.  Labs reviewed.  Hg 7.4 this AM, continue to monitor.  CBC tomorrow morning. K+ 2.8, will supplement potassium today. Up with therapy today. Patient has had a BM. Will need SNF upon discharge.  DVT Prophylaxis - Lovenox and TED hose Weight-Bearing as tolerated to right leg  J. , PA-C [facility] 12/15/2019, 7:49 AM

## 2019-12-15 NOTE — Progress Notes (Signed)
Physical Therapy Treatment Patient Details Name: [name] MRN: [id number] DOB: 29-Sep-1927 Today's Date: 12/15/2019    History of Present Illness  [name] is a 86 year old female with a history of HTN, osteoporosis, hypothyrodism, and dementia. She was admitted after a mechanical fall and landed on her R side. X-rays demonstrated a discplaced subtrochanteric fx of the R hip. She is status post ORIF performed 12/13/19.     PT Comments    Patient presents in recliner chair with family beside on arrival to room. She notes RLE hurts with rest and increases to 8/10 on faces pain scale with movement. Attempted sit to stand in recliner chair but patient yelling in pain. Attempted sliding transfer to bed with maxA+2, but pt guarded by pain and yelling with any movement. Performed squat pivot transfer maxA+2 from chair to EOB. Patient performed supine heel pumps in bed as that is the only movement she can tolerate. Pt left in bed with all needs. Downgrading frequency to 7x/week as she cannot tolerate BID due to pain/fatigue. Pt will benefit from skilled PT servicesto address deficits in strength, balance, and decrease risk for future falls.   Follow Up Recommendations  SNF     Equipment Recommendations  Rolling walker with 5" wheels    Recommendations for Other Services OT consult     Precautions / Restrictions Precautions Precautions: Fall Restrictions Weight Bearing Restrictions: Yes RLE Weight Bearing: Weight bearing as tolerated    Mobility  Bed Mobility Overal bed mobility: Needs Assistance Bed Mobility: Sit to Supine     Supine to sit: +2 for physical assistance;Mod assist Sit to supine: Mod assist;+2 for physical assistance   General bed mobility comments: Pt was modA+2 with assistance with trunk and LE to lie supine in bed. Pt limited due to RLE pain  Transfers Overall transfer level: Needs assistance Equipment used: None Transfers: Squat Pivot  Transfers     Squat pivot transfers: +2 physical assistance;Max assist    Lateral/Scoot Transfers: Max assist;+2 physical assistance General transfer comment: Pt attempted sit to stands in recliner chair, but pt unable to perform and screaming in pain. Attemped sliding transfer with linen chucks but pt guarded by pain and yelling with any movement. Performed squat pivot transfer maxA+2 from chair to EOB.  Ambulation/Gait             General Gait Details: Did not attempt as patient not able to stand due to being weak, fatigued, and in pain.   Stairs             Wheelchair Mobility    Modified Rankin (Stroke Patients Only)       Balance Overall balance assessment: Needs assistance Sitting-balance support: Bilateral upper extremity supported;Feet supported Sitting balance-Leahy Scale: Poor Sitting balance - Comments: Pt able to maintain balance but needs both UE supported with a R lateral/posterior lean. She is unable to maintain balance when reaching within BOS Postural control: Posterior lean;Right lateral lean     Standing balance comment: Not able to assess due to not being able to stand                            Cognition Arousal/Alertness: Awake/alert Behavior During Therapy: [facility] for tasks assessed/performed Overall Cognitive Status: History of cognitive impairments - at baseline  General Comments: Pt has dementia and more awake this afternoon than this morning      Exercises General Exercises - Lower Extremity Ankle Circles/Pumps: AROM;Both;10 reps;Supine Quad Sets: AROM;Both;5 reps;Supine Long Arc Quad: AROM;Both;5 reps;Seated Hip ABduction/ADduction: AAROM;Right;5 reps;Supine Straight Leg Raises: AAROM;Both;5 reps;Supine    General Comments        Pertinent Vitals/Pain Pain Assessment: Faces Faces Pain Scale: Hurts whole lot Pain Location: R LE Pain Descriptors / Indicators:  Aching;Sore Pain Intervention(s): Limited activity within patient's tolerance;Monitored during session    Home Living                      Prior Function            PT Goals (current goals can now be found in the care plan section) Acute Rehab PT Goals Patient Stated Goal: "to go home" PT Goal Formulation: Patient unable to participate in goal setting Time For Goal Achievement: 12/28/19 Potential to Achieve Goals: Fair Progress towards PT goals: Not progressing toward goals - comment (Due to cognition status)    Frequency    7X/week      PT Plan Frequency needs to be updated    Co-evaluation              AM-PAC PT "6 Clicks" Mobility   Outcome Measure  Help needed turning from your back to your side while in a flat bed without using bedrails?: A Lot Help needed moving from lying on your back to sitting on the side of a flat bed without using bedrails?: A Lot Help needed moving to and from a bed to a chair (including a wheelchair)?: Total Help needed standing up from a chair using your arms (e.g., wheelchair or bedside chair)?: Total Help needed to walk in hospital room?: Total Help needed climbing 3-5 steps with a railing? : Total 6 Click Score: 8    End of Session Equipment Utilized During Treatment: Gait belt Activity Tolerance: Patient limited by pain Patient left: in bed;with bed alarm set;with call bell/phone within reach;with family/visitor present Nurse Communication: Mobility status PT Visit Diagnosis: Unsteadiness on feet (R26.81);Other abnormalities of gait and mobility (R26.89);Repeated falls (R29.6);Muscle weakness (generalized) (M62.81);History of falling (Z91.81);Ataxic gait (R26.0);Difficulty in walking, not elsewhere classified (R26.2);Pain Pain - Right/Left: Right Pain - part of body: Leg     Time: 8841-6606 PT Time Calculation (min) (ACUTE ONLY): 30 min  Charges:  $Therapeutic Exercise: 8-22 mins $Therapeutic Activity: 8-22  mins                      Katherine Basset, SPT [facility] 12/15/2019, 1:44 PM

## 2019-12-15 NOTE — Care Management Important Message (Signed)
Important Message  Patient Details  Name: [name] MRN: [id number] Date of Birth: 1927/09/18   Medicare Important Message Given:  Yes  Initial Medicare IM given by Patient Access Associate on 12/14/2019 at 11:24am.     [name] 12/15/2019, 9:42 AM

## 2019-12-15 NOTE — Discharge Instructions (Signed)

## 2019-12-15 NOTE — Progress Notes (Addendum)
PROGRESS NOTE    [name]  FWY:[id number] DOB: January 18, 1928 DOA: 12/12/2019 PCP: Jamelle Haring, MD    Brief Narrative:  Deborah Coffin Griffinis a 86 y.o.femalewith medical history significant fordementia, chronic systolic heart failure, with dilated cardiomyopathy, last EF 25 to 30% 04/01/2018, nonrheumatic aortic insufficiency, HTN, HLD who suffered an unwitnessed fall in her home. According to granddaughter, sheheard a thud in the kitchenand went and saw her on the floor.She stated that a chair was out of place so it appeared that she tripped over the chair.The fire department came to help her up and assisted her up she appeared to have pain on the right hip and they brought her to the emergency room for evaluation. History is limited due to Alzheimer's dementia.  20/1-had BM. K 2.8   Consultants:   orthopedics  Procedures:   Antimicrobials:   Subjective: Pleasantly at baseline confusion.  Family at bedside.  She has no complaints of pain, shortness of breath, orthopnea or chest pain  Objective: Vitals:   12/14/19 0414 12/14/19 0738 12/15/19 0554 12/15/19 0740  BP: (!) 122/42 (!) 150/65 (!) 134/35 (!) 121/35  Pulse: 74 68 74 64  Resp: 16 17 17 15   Temp: 98.4 F (36.9 C) 98.1 F (36.7 C)  97.8 F (36.6 C)  TempSrc: Oral Oral  Oral  SpO2: 93% 95% 95% 94%  Weight:      Height:        Intake/Output Summary (Last 24 hours) at 12/15/2019 0810 Last data filed at 12/15/2019 0700 Gross per 24 hour  Intake 240 ml  Output 700 ml  Net -460 ml   Filed Weights   12/12/19 2035  Weight: 61.1 kg    Examination:  General exam: Appears calm and comfortable  Respiratory system: Clear to auscultation. Respiratory effort normal. Cardiovascular system: S1 & S2 heard, RRR. No JVD, murmurs, rubs, gallops or clicks.  Gastrointestinal system: Abdomen is nondistended, soft and nontender. No organomegaly or masses felt. Normal bowel sounds heard. Central  nervous system: Alert and oriented x2 but not to place.  Grossly intact Extremities: No edema Skin: Warm dry Psychiatry: Judgement and insight appear normal. Mood & affect appropriate.     Data Reviewed: I have personally reviewed following labs and imaging studies  CBC: Recent Labs  Lab 12/12/19 2045 12/13/19 0545 12/14/19 0503 12/15/19 0549  WBC 9.4 10.5 10.6* 10.0  NEUTROABS 5.5  --   --   --   HGB 12.1 10.6* 8.4* 7.4*  HCT 36.6 32.2* 24.5* 22.5*  MCV 89.7 91.5 88.8 93.0  PLT 258 220 205 186   Basic Metabolic Panel: Recent Labs  Lab 12/12/19 2045 12/13/19 0545 12/14/19 0503 12/15/19 0549  NA 140 140 139 136  K 2.9* 3.6 3.6 2.8*  CL 101 102 103 102  CO2 29 30 29 29   GLUCOSE 143* 166* 138* 141*  BUN 13 12 14 14   CREATININE 0.73 0.66 0.86 0.78  CALCIUM 8.5* 8.4* 8.4* 8.5*   GFR: Estimated Creatinine Clearance: 42.9 mL/min (by C-G formula based on SCr of 0.78 mg/dL). Liver Function Tests: No results for input(s): AST, ALT, ALKPHOS, BILITOT, PROT, ALBUMIN in the last 168 hours. No results for input(s): LIPASE, AMYLASE in the last 168 hours. No results for input(s): AMMONIA in the last 168 hours. Coagulation Profile: Recent Labs  Lab 12/13/19 0545  INR 0.9   Cardiac Enzymes: No results for input(s): CKTOTAL, CKMB, CKMBINDEX, TROPONINI in the last 168 hours. BNP (last 3 results) No results  for input(s): PROBNP in the last 8760 hours. HbA1C: No results for input(s): HGBA1C in the last 72 hours. CBG: No results for input(s): GLUCAP in the last 168 hours. Lipid Profile: No results for input(s): CHOL, HDL, LDLCALC, TRIG, CHOLHDL, LDLDIRECT in the last 72 hours. Thyroid Function Tests: No results for input(s): TSH, T4TOTAL, FREET4, T3FREE, THYROIDAB in the last 72 hours. Anemia Panel: No results for input(s): VITAMINB12, FOLATE, FERRITIN, TIBC, IRON, RETICCTPCT in the last 72 hours. Sepsis Labs: No results for input(s): PROCALCITON, LATICACIDVEN in the last  168 hours.  Recent Results (from the past 240 hour(s))  Respiratory Panel by RT PCR (Flu A&B, Covid) - Nasopharyngeal Swab     Status: None   Collection Time: 12/12/19  9:42 PM   Specimen: Nasopharyngeal Swab  Result Value Ref Range Status   SARS Coronavirus 2 by RT PCR NEGATIVE NEGATIVE Final    Comment: (NOTE) SARS-CoV-2 target nucleic acids are NOT DETECTED.  The SARS-CoV-2 RNA is generally detectable in upper respiratoy specimens during the acute phase of infection. The lowest concentration of SARS-CoV-2 viral copies this assay can detect is 131 copies/mL. A negative result does not preclude SARS-Cov-2 infection and should not be used as the sole basis for treatment or other patient management decisions. A negative result may occur with  improper specimen collection/handling, submission of specimen other than nasopharyngeal swab, presence of viral mutation(s) within the areas targeted by this assay, and inadequate number of viral copies (<131 copies/mL). A negative result must be combined with clinical observations, patient history, and epidemiological information. The expected result is Negative.  Fact Sheet for Patients:  https://www.moore.com/  Fact Sheet for Healthcare Providers:  https://www.young.biz/  This test is no t yet approved or cleared by the [place] FDA and  has been authorized for detection and/or diagnosis of SARS-CoV-2 by FDA under an Emergency Use Authorization (EUA). This EUA will remain  in effect (meaning this test can be used) for the duration of the COVID-19 declaration under Section 564(b)(1) of the Act, 21 U.S.C. section 360bbb-3(b)(1), unless the authorization is terminated or revoked sooner.     Influenza A by PCR NEGATIVE NEGATIVE Final   Influenza B by PCR NEGATIVE NEGATIVE Final    Comment: (NOTE) The Xpert Xpress SARS-CoV-2/FLU/RSV assay is intended as an aid in  the diagnosis of influenza from  Nasopharyngeal swab specimens and  should not be used as a sole basis for treatment. Nasal washings and  aspirates are unacceptable for Xpert Xpress SARS-CoV-2/FLU/RSV  testing.  Fact Sheet for Patients: https://www.moore.com/  Fact Sheet for Healthcare Providers: https://www.young.biz/  This test is not yet approved or cleared by the [place] FDA and  has been authorized for detection and/or diagnosis of SARS-CoV-2 by  FDA under an Emergency Use Authorization (EUA). This EUA will remain  in effect (meaning this test can be used) for the duration of the  Covid-19 declaration under Section 564(b)(1) of the Act, 21  U.S.C. section 360bbb-3(b)(1), unless the authorization is  terminated or revoked. Performed at [facility], [address], [place], [place] [address]   Surgical PCR screen     Status: None   Collection Time: 12/13/19  1:22 AM   Specimen: Nasal Mucosa; Nasal Swab  Result Value Ref Range Status   MRSA, PCR NEGATIVE NEGATIVE Final   Staphylococcus aureus NEGATIVE NEGATIVE Final    Comment: (NOTE) The Xpert SA Assay (FDA approved for NASAL specimens in patients 30 years of age and older), is one  component of a comprehensive surveillance program. It is not intended to diagnose infection nor to guide or monitor treatment. Performed at [facility], [address] [place], [place] [id number]          Radiology Studies: DG HIP OPERATIVE UNILAT W OR W/O PELVIS RIGHT  Result Date: 12/13/2019 CLINICAL DATA:  Proximal right femoral fracture EXAM: OPERATIVE RIGHT HIP WITH PELVIS COMPARISON:  12/12/2019 FLUOROSCOPY TIME:  Radiation Exposure Index (as provided by the fluoroscopic device): 12.2 mGy If the device does not provide the exposure index: Fluoroscopy Time:  1 minutes 12 seconds Number of Acquired Images:  4 FINDINGS: Four spot films were obtained intraoperatively and reveal a medullary rod in the right  femur. Fracture fragments have been reduced. Proximal and distal fixation screws are noted. IMPRESSION: Status post ORIF of proximal right femoral fracture. Electronically Signed   By: Alcide CleverMark  Lukens M.D.   On: 12/13/2019 13:28        Scheduled Meds: . calcium-vitamin D  1 tablet Oral Daily  . Chlorhexidine Gluconate Cloth  6 each Topical Daily  . docusate sodium  100 mg Oral BID  . enoxaparin (LOVENOX) injection  40 mg Subcutaneous Q24H  . feeding supplement  1 Container Oral TID BM  . furosemide  20 mg Oral Daily  . levothyroxine  75 mcg Oral Q0600  . memantine  10 mg Oral BID  . metoprolol succinate  25 mg Oral Q0600  . metoprolol succinate  50 mg Oral Q2200  . multivitamin with minerals  1 tablet Oral Daily  . potassium chloride  40 mEq Oral BID   Continuous Infusions:  Assessment & Plan:   Principal Problem:   Closed right hip fracture [facility](HCC) Active Problems:   Hypothyroidism   Dementia of the Alzheimer's type without behavioral disturbance (HCC)   HTN (hypertension)   Aortic insufficiency   Chronic systolic heart failure (HCC)   Fall at home, initial encounter   Preoperative clearance   Dilated cardiomyopathy (HCC)   Closed right hip fracture (HCC) Fall at home, initial encounter Preoperative clearance -Fall was unwitnessed, but suspect mechanical. Status post ORIF  Continue pain management and bowel management  PT recommended SNF  Lovenox and TED hose for DVT prophylaxis  Weightbearing as tolerated to right leg Will need to follow-up as outpatient with orthopedics   Hypokalemia- k 2.8, will supplement Ck am level Ck magnesium  Post op anemia- hg 7.4 No evidence of bleed, except due to post op Ck am cbc, transfuse if Hg 7 or less   Urinary Retention-post op Need to make sure has bm. Bladder scan q4 hrs , I/o cath >300   Chronic systolic heart failure (HCC) Aortic insufficiency Dilated cardiomyopathy (HCC) -Last echocardiogram was  04/01/2018 that showed EF 25 to 30% euvolemic on exam, asx. Continue beta blk and lasix Continue monitor vol. Status.    Hypokalemia-supplemented and stable.  Hypothyroidism Continue with synthroid  Dementia of the Alzheimer's type without behavioral disturbance (HCC) Resume Namenda  HTN (hypertension) -Continue metoprolol  Glaucoma -Continue eyedrops   DVT prophylaxis: Lovenox Code Status: DNR Family Communication: Daughter and granddaughter at bedside  Status is: Inpatient  Remains inpatient appropriate because:Unsafe d/c plan   Dispo: The patient is from: Home              Anticipated d/c is to: SNF              Anticipated d/c date is: 1 day  Patient currently is medically stable to d/c.SNF placement pending            LOS: 3 days   Time spent: 45 minutes with more than 50% on COC    Lynn Ito, MD [facility] Pager [phone]  If 7PM-7AM, please contact night-coverage [email or web address] Password [email or web address] 12/15/2019, 8:10 AM

## 2019-12-15 NOTE — Progress Notes (Signed)
Physical Therapy Treatment Patient Details Name: [name] MRN: [id number] DOB: November 07, 1927 Today's Date: 12/15/2019    History of Present Illness  [name] is a 86 year old female with a history of HTN, osteoporosis, hypothyrodism, and dementia. She was admitted after a mechanical fall and landed on her R side. X-rays demonstrated a discplaced subtrochanteric fx of the R hip. She is status post ORIF performed 12/13/19.     PT Comments    Patient presents supine in bed with family bedside on arrival to room. She notes pain in RLE upon rest and which increases to 8/10 on faces pain scale with movement. Patient able to minimally engage in supine exercises, but once sitting EOB she is able to perform LAQ. She continues to be modA+2 for bed mobility, however with sit to stand transfer she was unable to stand despite several attempts. Transitioned to sliding transfer to recliner chair with maxA+2. Attempted sit to stand in recliner chair but pt refused due to fatigue/pain. She is left in chair with all needs. Pt will benefit from OT evaluation to help with dressing and basic ADLs. Pt will benefit from skilled PT servicesto address deficits in strength, balance, and decrease risk for future falls.  Follow Up Recommendations  SNF     Equipment Recommendations  Rolling walker with 5" wheels    Recommendations for Other Services OT consult     Precautions / Restrictions Precautions Precautions: Fall Restrictions Weight Bearing Restrictions: Yes RLE Weight Bearing: Weight bearing as tolerated    Mobility  Bed Mobility Overal bed mobility: Needs Assistance Bed Mobility: Supine to Sit     Supine to sit: +2 for physical assistance;Mod assist     General bed mobility comments: Pt was modA+2 with assistance with trunk and LE to sit EOB. Pt attempted but having difficulty sitting up herself and limited due to RLE pain  Transfers Overall transfer level: Needs  assistance Equipment used: None Transfers: Lateral/Scoot Transfers          Lateral/Scoot Transfers: Max assist;+2 physical assistance General transfer comment: Pt attempted sit to stands, but pt unable to perform and refused due to pain. Performed sliding transfer with linen chucks maxA+2, took 4 slides to perform. Pt able to weight shift and push with UE to slide into recliner chair  Ambulation/Gait             General Gait Details: Did not attempt as patient not able to stand due to being weak, fatigued, and in pain.   Stairs             Wheelchair Mobility    Modified Rankin (Stroke Patients Only)       Balance Overall balance assessment: Needs assistance Sitting-balance support: Bilateral upper extremity supported;Feet supported Sitting balance-Leahy Scale: Poor Sitting balance - Comments: Pt able to maintain balance but needs both UE supported with a R lateral/posterior lean. She is unable to maintain balance when reaching within BOS Postural control: Posterior lean;Right lateral lean     Standing balance comment: Not able to assess due to not being able to stand                            Cognition Arousal/Alertness: Lethargic Behavior During Therapy: [facility] for tasks assessed/performed Overall Cognitive Status: History of cognitive impairments - at baseline  General Comments: Pt has dementia and was tired this session due to taking benadryl last night to help her sleep.      Exercises General Exercises - Lower Extremity Ankle Circles/Pumps: Both;5 reps;Supine;AAROM Quad Sets: AROM;Both;5 reps;Supine Long Arc Quad: AROM;Both;5 reps;Seated Hip ABduction/ADduction: AAROM;Right;5 reps;Supine Straight Leg Raises: AAROM;Both;5 reps;Supine    General Comments        Pertinent Vitals/Pain Pain Assessment: Faces Faces Pain Scale: Hurts whole lot Pain Location: R LE Pain Descriptors / Indicators:  Aching;Sore Pain Intervention(s): Monitored during session;Limited activity within patient's tolerance    Home Living                      Prior Function            PT Goals (current goals can now be found in the care plan section) Acute Rehab PT Goals Patient Stated Goal: "to go home" PT Goal Formulation: Patient unable to participate in goal setting Time For Goal Achievement: 12/28/19 Potential to Achieve Goals: Fair Progress towards PT goals: Progressing toward goals    Frequency    BID      PT Plan Current plan remains appropriate    Co-evaluation              AM-PAC PT "6 Clicks" Mobility   Outcome Measure  Help needed turning from your back to your side while in a flat bed without using bedrails?: A Lot Help needed moving from lying on your back to sitting on the side of a flat bed without using bedrails?: A Lot Help needed moving to and from a bed to a chair (including a wheelchair)?: Total Help needed standing up from a chair using your arms (e.g., wheelchair or bedside chair)?: Total Help needed to walk in hospital room?: Total Help needed climbing 3-5 steps with a railing? : Total 6 Click Score: 8    End of Session Equipment Utilized During Treatment: Gait belt Activity Tolerance: Patient limited by pain Patient left: in chair;with call bell/phone within reach;with chair alarm set;with family/visitor present Nurse Communication: Mobility status PT Visit Diagnosis: Unsteadiness on feet (R26.81);Other abnormalities of gait and mobility (R26.89);Repeated falls (R29.6);Muscle weakness (generalized) (M62.81);History of falling (Z91.81);Ataxic gait (R26.0);Difficulty in walking, not elsewhere classified (R26.2);Pain Pain - Right/Left: Right Pain - part of body: Leg     Time: 4010-2725 PT Time Calculation (min) (ACUTE ONLY): 32 min  Charges:  $Therapeutic Exercise: 8-22 mins $Therapeutic Activity: 8-22 mins                      Katherine Basset,  SPT Baker Pierini 12/15/2019, 12:41 PM

## 2019-12-16 LAB — BASIC METABOLIC PANEL
Anion gap: 7 (ref 5–15)
BUN: 15 mg/dL (ref 8–23)
CO2: 25 mmol/L (ref 22–32)
Calcium: 8.4 mg/dL — ABNORMAL LOW (ref 8.9–10.3)
Chloride: 100 mmol/L (ref 98–111)
Creatinine, Ser: 0.77 mg/dL (ref 0.44–1.00)
GFR calc Af Amer: >60 mL/min (ref >60)
GFR calc non Af Amer: >60 mL/min (ref >60)
Glucose, Bld: 150 mg/dL — ABNORMAL HIGH (ref 70–99)
Potassium: 3.7 mmol/L (ref 3.5–5.1)
Sodium: 132 mmol/L — ABNORMAL LOW (ref 135–145)

## 2019-12-16 LAB — PREPARE RBC (CROSSMATCH)

## 2019-12-16 LAB — CBC
HCT: 18.9 % — ABNORMAL LOW (ref 36.0–46.0)
Hemoglobin: 6.2 g/dL — ABNORMAL LOW (ref 12.0–15.0)
MCH: 29.8 pg (ref 26.0–34.0)
MCHC: 32.8 g/dL (ref 30.0–36.0)
MCV: 90.9 fL (ref 80.0–100.0)
Platelets: 190 10*3/uL (ref 150–400)
RBC: 2.08 MIL/uL — ABNORMAL LOW (ref 3.87–5.11)
RDW: 13 % (ref 11.5–15.5)
WBC: 10.3 10*3/uL (ref 4.0–10.5)
nRBC: 0 % (ref 0.0–0.2)

## 2019-12-16 LAB — HEMOGLOBIN AND HEMATOCRIT, BLOOD
HCT: 27.8 % — ABNORMAL LOW (ref 36.0–46.0)
Hemoglobin: 9.4 g/dL — ABNORMAL LOW (ref 12.0–15.0)

## 2019-12-16 LAB — ABO/RH: ABO/RH(D): B POS

## 2019-12-16 MED ORDER — SODIUM CHLORIDE 0.9% IV SOLUTION: Freq: Once | INTRAVENOUS | Status: AC

## 2019-12-16 MED ORDER — SODIUM CHLORIDE 0.9% IV SOLUTION: Freq: Once | INTRAVENOUS | Status: DC

## 2019-12-16 MED ORDER — FUROSEMIDE 10 MG/ML IJ SOLN
20.0000 mg | Freq: Once | INTRAMUSCULAR | Status: AC
  Administered 2019-12-16: 20 mg via INTRAVENOUS
  Filled 2019-12-16: qty 4

## 2019-12-16 MED ORDER — ACETAMINOPHEN 325 MG PO TABS
650.0000 mg | ORAL_TABLET | Freq: Once | ORAL | Status: AC
  Administered 2019-12-16: 650 mg via ORAL
  Filled 2019-12-16: qty 2

## 2019-12-16 NOTE — Progress Notes (Signed)
PT Cancellation Note  Patient Details Name: [name] MRN: [id number] DOB: 07/02/1927   Cancelled Treatment:     PT hold per protocol. HGB 6.2 and pt reporting dizziness. Will need transfusion. Acute PT will continue to follow per POC and progress as able per pt tolerance. Continued recommendation for SNF at DC.    [name] 12/16/2019, 8:59 AM

## 2019-12-16 NOTE — Progress Notes (Signed)
PROGRESS NOTE    [name]  QHU:[id number] DOB: Apr 01, 1927 DOA: 12/12/2019 PCP: Deborah Haring, MD    Brief Narrative:  Deborah Coffin Griffinis a 86 y.o.femalewith medical history significant fordementia, chronic systolic heart failure, with dilated cardiomyopathy, last EF 25 to 30% 04/01/2018, nonrheumatic aortic insufficiency, HTN, HLD who suffered an unwitnessed fall in her home. According to granddaughter, sheheard a thud in the kitchenand went and saw her on the floor.She stated that a chair was out of place so it appeared that she tripped over the chair.The fire department came to help her up and assisted her up she appeared to have pain on the right hip and they brought her to the emergency room for evaluation. History is limited due to Alzheimer's dementia.  10/2- MS at baseline. Hg 6.2 spoke to grandaughter about blood transfusion , she is agreeable . Having urinary retention based on bladder scan and I/O cath .    Consultants:   orthopedics  Procedures:   Antimicrobials:   Subjective: Pt has no complaints. GD states she appears little sob with moving in bed.  No bm x2 days per nsg   Objective: Vitals:   12/15/19 1617 12/15/19 2058 12/15/19 2101 12/15/19 2324  BP: (!) 106/45 (!) 105/31 (!) 111/47 (!) 116/41  Pulse: 62 68 68 68  Resp: 17   19  Temp: 98.7 F (37.1 C)   98.7 F (37.1 C)  TempSrc: Oral   Oral  SpO2: 96%   94%  Weight:      Height:        Intake/Output Summary (Last 24 hours) at 12/16/2019 0824 Last data filed at 12/16/2019 0640 Gross per 24 hour  Intake --  Output 675 ml  Net -675 ml   Filed Weights   12/12/19 2035  Weight: 61.1 kg    Examination: Comfortable, NAD, pleasant , ms at baseline No cta , no r/r/w RRR , s1/s2 Benign +bs, soft nt +no edema Awake, Grossly intact   Data Reviewed: I have personally reviewed following labs and imaging studies  CBC: Recent Labs  Lab 12/12/19 2045  12/13/19 0545 12/14/19 0503 12/15/19 0549 12/16/19 0413  WBC 9.4 10.5 10.6* 10.0 10.3  NEUTROABS 5.5  --   --   --   --   HGB 12.1 10.6* 8.4* 7.4* 6.2*  HCT 36.6 32.2* 24.5* 22.5* 18.9*  MCV 89.7 91.5 88.8 93.0 90.9  PLT 258 220 205 186 190   Basic Metabolic Panel: Recent Labs  Lab 12/12/19 2045 12/13/19 0545 12/14/19 0503 12/15/19 0549 12/16/19 0413  NA 140 140 139 136 132*  K 2.9* 3.6 3.6 2.8* 3.7  CL 101 102 103 102 100  CO2 29 30 29 29 25   GLUCOSE 143* 166* 138* 141* 150*  BUN 13 12 14 14 15   CREATININE 0.73 0.66 0.86 0.78 0.77  CALCIUM 8.5* 8.4* 8.4* 8.5* 8.4*   GFR: Estimated Creatinine Clearance: 42.9 mL/min (by C-G formula based on SCr of 0.77 mg/dL). Liver Function Tests: No results for input(s): AST, ALT, ALKPHOS, BILITOT, PROT, ALBUMIN in the last 168 hours. No results for input(s): LIPASE, AMYLASE in the last 168 hours. No results for input(s): AMMONIA in the last 168 hours. Coagulation Profile: Recent Labs  Lab 12/13/19 0545  INR 0.9   Cardiac Enzymes: No results for input(s): CKTOTAL, CKMB, CKMBINDEX, TROPONINI in the last 168 hours. BNP (last 3 results) No results for input(s): PROBNP in the last 8760 hours. HbA1C: No results for input(s): HGBA1C in  the last 72 hours. CBG: No results for input(s): GLUCAP in the last 168 hours. Lipid Profile: No results for input(s): CHOL, HDL, LDLCALC, TRIG, CHOLHDL, LDLDIRECT in the last 72 hours. Thyroid Function Tests: No results for input(s): TSH, T4TOTAL, FREET4, T3FREE, THYROIDAB in the last 72 hours. Anemia Panel: No results for input(s): VITAMINB12, FOLATE, FERRITIN, TIBC, IRON, RETICCTPCT in the last 72 hours. Sepsis Labs: No results for input(s): PROCALCITON, LATICACIDVEN in the last 168 hours.  Recent Results (from the past 240 hour(s))  Respiratory Panel by RT PCR (Flu A&B, Covid) - Nasopharyngeal Swab     Status: None   Collection Time: 12/12/19  9:42 PM   Specimen: Nasopharyngeal Swab  Result  Value Ref Range Status   SARS Coronavirus 2 by RT PCR NEGATIVE NEGATIVE Final    Comment: (NOTE) SARS-CoV-2 target nucleic acids are NOT DETECTED.  The SARS-CoV-2 RNA is generally detectable in upper respiratoy specimens during the acute phase of infection. The lowest concentration of SARS-CoV-2 viral copies this assay can detect is 131 copies/mL. A negative result does not preclude SARS-Cov-2 infection and should not be used as the sole basis for treatment or other patient management decisions. A negative result may occur with  improper specimen collection/handling, submission of specimen other than nasopharyngeal swab, presence of viral mutation(s) within the areas targeted by this assay, and inadequate number of viral copies (<131 copies/mL). A negative result must be combined with clinical observations, patient history, and epidemiological information. The expected result is Negative.  Fact Sheet for Patients:  https://www.moore.com/https://www.fda.gov/media/142436/download  Fact Sheet for Healthcare Providers:  https://www.young.biz/https://www.fda.gov/media/142435/download  This test is no t yet approved or cleared by the [place] FDA and  has been authorized for detection and/or diagnosis of SARS-CoV-2 by FDA under an Emergency Use Authorization (EUA). This EUA will remain  in effect (meaning this test can be used) for the duration of the COVID-19 declaration under Section 564(b)(1) of the Act, 21 U.S.C. section 360bbb-3(b)(1), unless the authorization is terminated or revoked sooner.     Influenza A by PCR NEGATIVE NEGATIVE Final   Influenza B by PCR NEGATIVE NEGATIVE Final    Comment: (NOTE) The Xpert Xpress SARS-CoV-2/FLU/RSV assay is intended as an aid in  the diagnosis of influenza from Nasopharyngeal swab specimens and  should not be used as a sole basis for treatment. Nasal washings and  aspirates are unacceptable for Xpert Xpress SARS-CoV-2/FLU/RSV  testing.  Fact Sheet for  Patients: https://www.moore.com/https://www.fda.gov/media/142436/download  Fact Sheet for Healthcare Providers: https://www.young.biz/https://www.fda.gov/media/142435/download  This test is not yet approved or cleared by the [place] FDA and  has been authorized for detection and/or diagnosis of SARS-CoV-2 by  FDA under an Emergency Use Authorization (EUA). This EUA will remain  in effect (meaning this test can be used) for the duration of the  Covid-19 declaration under Section 564(b)(1) of the Act, 21  U.S.C. section 360bbb-3(b)(1), unless the authorization is  terminated or revoked. Performed at [facility], [address] [place], [place] [address]   Surgical PCR screen     Status: None   Collection Time: 12/13/19  1:22 AM   Specimen: Nasal Mucosa; Nasal Swab  Result Value Ref Range Status   MRSA, PCR NEGATIVE NEGATIVE Final   Staphylococcus aureus NEGATIVE NEGATIVE Final    Comment: (NOTE) The Xpert SA Assay (FDA approved for NASAL specimens in patients 84 years of age and older), is one component of a comprehensive surveillance program. It is not intended to diagnose infection nor to  guide or monitor treatment. Performed at [facility], [address] [place], [place] [address]          Radiology Studies: No results found.      Scheduled Meds: . bisacodyl  10 mg Rectal Once  . calcium-vitamin D  1 tablet Oral Daily  . Chlorhexidine Gluconate Cloth  6 each Topical Daily  . docusate sodium  100 mg Oral BID  . enoxaparin (LOVENOX) injection  40 mg Subcutaneous Q24H  . feeding supplement  1 Container Oral TID BM  . furosemide  20 mg Oral Daily  . levothyroxine  75 mcg Oral Q0600  . memantine  10 mg Oral BID  . metoprolol succinate  25 mg Oral Q0600  . metoprolol succinate  50 mg Oral Q2200  . multivitamin with minerals  1 tablet Oral Daily  . polyethylene glycol  17 g Oral Daily  . senna  2 tablet Oral Daily   Continuous Infusions:  Assessment & Plan:   Principal  Problem:   Closed right hip fracture (HCC) Active Problems:   Hypothyroidism   Dementia of the Alzheimer's type without behavioral disturbance (HCC)   HTN (hypertension)   Aortic insufficiency   Chronic systolic heart failure (HCC)   Fall at home, initial encounter   Preoperative clearance   Dilated cardiomyopathy (HCC)   Closed right hip fracture (HCC) Fall at home, initial encounter Preoperative clearance -Fall was unwitnessed, but suspect mechanical. Status post ORIF  Continue pain management and bowel regimen  Lovenox and TED hose for DVT prophylaxis  Per Ortho patient will continue Lovenox 40 mg subcu daily for 14 days  Follow-up with [facility] in 2 weeks for staple removal and repeat x-ray of the right femur  Weightbearing as tolerated to the right leg  PT recommended SNF-pending     Hypokalemia- Was supplemented, now stable.  K is 3.7 today    Post op anemia-  No evidence of active bleed Hemoglobin 6.2 Discussed transfusion with family who is agreeable, will transfuse 1 U prbc Post transfusion lasix 20mg  iv x1 to avoid volume overload as she has SHF   Urinary Retention-post op Foley was removed, but still with urinary retention after multiple bladder scans requiring I/O, will replace foley and try to do voiding trial as she ambulates more    Chronic systolic heart failure (HCC) Aortic insufficiency Dilated cardiomyopathy (HCC) -Last echocardiogram was 04/01/2018 that showed EF 25 to 30% Without acute exacerbation .euvolemic on exam, asx. Continue beta blk and lasix Continue monitoring volume status    Hypothyroidism Continue Synthroid  Dementia of the Alzheimer's type without behavioral disturbance (HCC) Resume Namenda  HTN (hypertension) -Continue metoprolol  Glaucoma -Continue eyedrops   DVT prophylaxis: Lovenox Code Status: DNR Family Communication: Daughter and granddaughter at bedside  Status is:  Inpatient  Remains inpatient appropriate because:Unsafe d/c plan   Dispo: The patient is from: Home              Anticipated d/c is to: SNF              Anticipated d/c date is: 1 day              Patient currently is medically stable to d/c.SNF placement pending            LOS: 4 days   Time spent: 45 minutes with more than 50% on COC    04/03/2018, MD [facility] Pager [phone]  If 7PM-7AM, please contact night-coverage [email or web address]  Password [email or web address] 12/16/2019, 8:24 AM

## 2019-12-16 NOTE — Progress Notes (Signed)
  Subjective: 3 Days Post-Op Procedure(s) (LRB): INTRAMEDULLARY (IM) NAIL INTERTROCHANTRIC (Right) Patient reports her pain is mild at the moment. Patient is well, and has had no acute complaints or problems Continue to work with PT, will need SNF upon discharge. Negative for chest pain and shortness of breath Fever: no Gastrointestinal:Negative for nausea and vomiting  Objective: Vital signs in last 24 hours: Temp:  [98.7 F (37.1 C)] 98.7 F (37.1 C) (10/01 2324) Pulse Rate:  [62-68] 68 (10/01 2324) Resp:  [17-19] 19 (10/01 2324) BP: (105-116)/(31-47) 116/41 (10/01 2324) SpO2:  [94 %-96 %] 94 % (10/01 2324)  Intake/Output from previous day:  Intake/Output Summary (Last 24 hours) at 12/16/2019 0856 Last data filed at 12/16/2019 0640 Gross per 24 hour  Intake --  Output 675 ml  Net -675 ml    Intake/Output this shift: No intake/output data recorded.  Labs: Recent Labs    12/14/19 0503 12/15/19 0549 12/16/19 0413  HGB 8.4* 7.4* 6.2*   Recent Labs    12/15/19 0549 12/16/19 0413  WBC 10.0 10.3  RBC 2.42* 2.08*  HCT 22.5* 18.9*  PLT 186 190   Recent Labs    12/15/19 0549 12/16/19 0413  NA 136 132*  K 2.8* 3.7  CL 102 100  CO2 29 25  BUN 14 15  CREATININE 0.78 0.77  GLUCOSE 141* 150*  CALCIUM 8.5* 8.4*   No results for input(s): LABPT, INR in the last 72 hours.   EXAM General - Patient is alert and oriented this AM.  Able to answer simple questions this morning. Extremity - ABD soft Sensation intact distally Dorsiflexion/Plantar flexion intact Incision: dressing C/D/I No cellulitis present Dressing/Incision - clean, dry, no drainage Motor Function - intact, moving foot and toes well on exam.  Bowel sounds intact, abdomen soft to palpation.  Past Medical History:  Diagnosis Date  . Dementia (HCC)   . Glaucoma   . HTN (hypertension) 02/10/2017  . Osteoporosis   . Thyroid disease     Assessment/Plan: 3 Days Post-Op Procedure(s)  (LRB): INTRAMEDULLARY (IM) NAIL INTERTROCHANTRIC (Right) Principal Problem:   Closed right hip fracture (HCC) Active Problems:   Hypothyroidism   Dementia of the Alzheimer's type without behavioral disturbance (HCC)   HTN (hypertension)   Aortic insufficiency   Chronic systolic heart failure (HCC)   Fall at home, initial encounter   Preoperative clearance   Dilated cardiomyopathy (HCC)  Estimated body mass index is 22.42 kg/m as calculated from the following:   Height as of this encounter: 5\' 5"  (1.651 m).   Weight as of this encounter: 61.1 kg. Up with therapy    Labs reviewed.  Hg 6.2 this AM and she reports dizziness.  1 unit of PRBC has been ordered for the patient. Hypokalemia resolved. Up with therapy today. Patient has had a BM. Will need SNF upon discharge.  Upon discharge patient will continue Lovenox 40mg  daily for 14 days. Follow-up with [facility] orthopaedics in two weeks for staple removal and repeat x-rays of the right femur.  DVT Prophylaxis - Lovenox and TED hose Weight-Bearing as tolerated to right leg  J. , PA-C [facility] Orthopaedic Surgery 12/16/2019, 8:56 AM

## 2019-12-16 NOTE — TOC Progression Note (Signed)
Transition of Care [facility]) - Progression Note    Patient Details  Name: [name] MRN: [id number] Date of Birth: [birth year]  [facility] CM/SW Contact  Maud Deed, LCSW Phone Number: 12/16/2019, 2:35 PM  Clinical Narrative:    Pt and family decided on [facility] for rehab. CSW contacted Tanya at [facility] and she stated they currently have no beds available.    Expected Discharge Plan: Skilled Nursing Facility Barriers to Discharge: Continued Medical Work up  Expected Discharge Plan and Services Expected Discharge Plan: Skilled Nursing Facility       Living arrangements for the past 2 months: Single Family Home                                       Social Determinants of Health (SDOH) Interventions    Readmission Risk Interventions No flowsheet data found.

## 2019-12-17 LAB — BASIC METABOLIC PANEL
Anion gap: 7 (ref 5–15)
BUN: 12 mg/dL (ref 8–23)
CO2: 28 mmol/L (ref 22–32)
Calcium: 8.6 mg/dL — ABNORMAL LOW (ref 8.9–10.3)
Chloride: 99 mmol/L (ref 98–111)
Creatinine, Ser: 0.64 mg/dL (ref 0.44–1.00)
GFR calc Af Amer: >60 mL/min (ref >60)
GFR calc non Af Amer: >60 mL/min (ref >60)
Glucose, Bld: 128 mg/dL — ABNORMAL HIGH (ref 70–99)
Potassium: 3.5 mmol/L (ref 3.5–5.1)
Sodium: 134 mmol/L — ABNORMAL LOW (ref 135–145)

## 2019-12-17 LAB — TYPE AND SCREEN
ABO/RH(D): B POS
Antibody Screen: NEGATIVE
Unit division: 0
Unit division: 0

## 2019-12-17 LAB — BPAM RBC
Blood Product Expiration Date: 202110232359
Blood Product Expiration Date: 202110242359
ISSUE DATE / TIME: 202110021338
Unit Type and Rh: 1700
Unit Type and Rh: 1700

## 2019-12-17 LAB — CBC
HCT: 26.2 % — ABNORMAL LOW (ref 36.0–46.0)
Hemoglobin: 8.8 g/dL — ABNORMAL LOW (ref 12.0–15.0)
MCH: 31 pg (ref 26.0–34.0)
MCHC: 33.6 g/dL (ref 30.0–36.0)
MCV: 92.3 fL (ref 80.0–100.0)
Platelets: 230 10*3/uL (ref 150–400)
RBC: 2.84 MIL/uL — ABNORMAL LOW (ref 3.87–5.11)
RDW: 13.6 % (ref 11.5–15.5)
WBC: 11.9 10*3/uL — ABNORMAL HIGH (ref 4.0–10.5)
nRBC: 0 % (ref 0.0–0.2)

## 2019-12-17 LAB — PREPARE RBC (CROSSMATCH)

## 2019-12-17 MED ORDER — ALPRAZOLAM 0.25 MG PO TABS
0.2500 mg | ORAL_TABLET | Freq: Once | ORAL | Status: AC
  Administered 2019-12-17: 0.25 mg via ORAL
  Filled 2019-12-17: qty 1

## 2019-12-17 NOTE — Progress Notes (Addendum)
Physical Therapy Treatment Patient Details Name: [name] MRN: [id number] DOB: 03-Mar-1928 Today's Date: 12/17/2019    History of Present Illness  [name] is a 86 year old female with a history of HTN, osteoporosis, hypothyrodism, and dementia. She was admitted after a mechanical fall and landed on her R side. X-rays demonstrated a discplaced subtrochanteric fx of the R hip. She is status post ORIF performed 12/13/19.     PT Comments    Participated in exercises as described below.  To EOB with mod a x 2. sitting with min guard for safety.  She is able to stand with RW with mod/max a x 2 briefly with poor stance,  Trunk flexed with R knee unable to fully extend.  Sits to rest and stated she needed to void quickly.  Stood and bedpan was placed under her briefly to void.  Walker not used for transfer to chair given safety with standing prior.  Stand pivot to recliner with max a x 1 after pericare provided by tech.    While pt requires significant assist with staff in directed tasks, granddaughter stated she attempts to get out of bed on her own and is quite "wiggly" when self motivated for mobility.    Follow Up Recommendations  SNF     Equipment Recommendations  Rolling walker with 5" wheels    Recommendations for Other Services       Precautions / Restrictions Precautions Precautions: Fall Restrictions Weight Bearing Restrictions: Yes RLE Weight Bearing: Weight bearing as tolerated    Mobility  Bed Mobility Overal bed mobility: Needs Assistance       Supine to sit: Mod assist;+2 for physical assistance        Transfers Overall transfer level: Needs assistance Equipment used: Rolling walker (2 wheeled);None   Sit to Stand: Mod assist;+2 physical assistance Stand pivot transfers: Mod assist          Ambulation/Gait             General Gait Details: unable   Stairs             Wheelchair Mobility    Modified Rankin (Stroke  Patients Only)       Balance Overall balance assessment: Needs assistance Sitting-balance support: Bilateral upper extremity supported;Feet supported Sitting balance-Leahy Scale: Poor     Standing balance support: Bilateral upper extremity supported Standing balance-Leahy Scale: Poor                              Cognition Arousal/Alertness: Awake/alert Behavior During Therapy: WFL for tasks assessed/performed Overall Cognitive Status: History of cognitive impairments - at baseline                                        Exercises General Exercises - Lower Extremity Ankle Circles/Pumps: AROM;Strengthening;Both;5 reps;Supine (manual resistance on eccentric motion) Quad Sets: AROM;Right;Both;5 reps;Supine Short Arc Quad: AROM;Both;10 reps;Supine Heel Slides: AAROM;Both;10 reps;Supine Hip ABduction/ADduction: AAROM;Both;10 reps;Supine Straight Leg Raises: AAROM;Both;5 reps;Supine    General Comments        Pertinent Vitals/Pain Pain Assessment: Faces Faces Pain Scale: Hurts little more Pain Location: R LE Pain Descriptors / Indicators: Aching;Grimacing;Operative site guarding;Moaning Pain Intervention(s): Limited activity within patient's tolerance;Monitored during session;Premedicated before session;Repositioned    Home Living  Prior Function            PT Goals (current goals can now be found in the care plan section) Progress towards PT goals: Progressing toward goals    Frequency    7X/week      PT Plan Current plan remains appropriate    Co-evaluation              AM-PAC PT "6 Clicks" Mobility   Outcome Measure  Help needed turning from your back to your side while in a flat bed without using bedrails?: Total Help needed moving from lying on your back to sitting on the side of a flat bed without using bedrails?: Total Help needed moving to and from a bed to a chair (including a  wheelchair)?: Total Help needed standing up from a chair using your arms (e.g., wheelchair or bedside chair)?: Total Help needed to walk in hospital room?: Total Help needed climbing 3-5 steps with a railing? : Total 6 Click Score: 6    End of Session Equipment Utilized During Treatment: Gait belt Activity Tolerance: Patient limited by pain Patient left: in chair;with call bell/phone within reach;with chair alarm set;with family/visitor present Nurse Communication: Mobility status Pain - Right/Left: Right Pain - part of body: Leg     Time: 2992-4268 PT Time Calculation (min) (ACUTE ONLY): 23 min  Charges:  $Therapeutic Exercise: 8-22 mins $Therapeutic Activity: 8-22 mins                    Danielle Dess, PTA 12/17/19, 10:43 AM

## 2019-12-17 NOTE — Progress Notes (Signed)
  Subjective: 4 Days Post-Op Procedure(s) (LRB): INTRAMEDULLARY (IM) NAIL INTERTROCHANTRIC (Right) Patient reports her pain is mild at the moment. Patient is well, and has had no acute complaints or problems Continue to work with PT, will need SNF upon discharge. Negative for chest pain and shortness of breath Fever: no Gastrointestinal:Negative for nausea and vomiting  Objective: Vital signs in last 24 hours: Temp:  [99.1 F (37.3 C)-99.4 F (37.4 C)] 99.1 F (37.3 C) (10/03 0718) Pulse Rate:  [79-88] 79 (10/03 0718) Resp:  [16-18] 16 (10/03 0718) BP: (139-171)/(42-55) 150/55 (10/03 0718) SpO2:  [94 %-97 %] 95 % (10/03 0718)  Intake/Output from previous day:  Intake/Output Summary (Last 24 hours) at 12/17/2019 1142 Last data filed at 12/17/2019 0215 Gross per 24 hour  Intake 457.5 ml  Output 900 ml  Net -442.5 ml    Intake/Output this shift: No intake/output data recorded.  Labs: Recent Labs    12/15/19 0549 12/16/19 0413 12/16/19 1836 12/17/19 0829  HGB 7.4* 6.2* 9.4* 8.8*   Recent Labs    12/16/19 0413 12/16/19 0413 12/16/19 1836 12/17/19 0829  WBC 10.3  --   --  11.9*  RBC 2.08*  --   --  2.84*  HCT 18.9*   < > 27.8* 26.2*  PLT 190  --   --  230   < > = values in this interval not displayed.   Recent Labs    12/16/19 0413 12/17/19 0829  NA 132* 134*  K 3.7 3.5  CL 100 99  CO2 25 28  BUN 15 12  CREATININE 0.77 0.64  GLUCOSE 150* 128*  CALCIUM 8.4* 8.6*   No results for input(s): LABPT, INR in the last 72 hours.   EXAM General - Patient is alert and oriented this AM.  Able to answer simple questions this morning. Extremity - ABD soft Sensation intact distally Dorsiflexion/Plantar flexion intact Incision: dressing C/D/I No cellulitis present Dressing/Incision - clean, dry, no drainage.  Ecchymosis to the right proximal incision. Motor Function - intact, moving foot and toes well on exam.  Bowel sounds intact, abdomen soft to  palpation.  Past Medical History:  Diagnosis Date  . Dementia (HCC)   . Glaucoma   . HTN (hypertension) 02/10/2017  . Osteoporosis   . Thyroid disease     Assessment/Plan: 4 Days Post-Op Procedure(s) (LRB): INTRAMEDULLARY (IM) NAIL INTERTROCHANTRIC (Right) Principal Problem:   Closed right hip fracture (HCC) Active Problems:   Hypothyroidism   Dementia of the Alzheimer's type without behavioral disturbance (HCC)   HTN (hypertension)   Aortic insufficiency   Chronic systolic heart failure (HCC)   Fall at home, initial encounter   Preoperative clearance   Dilated cardiomyopathy (HCC)  Estimated body mass index is 22.42 kg/m as calculated from the following:   Height as of this encounter: 5\' 5"  (1.651 m).   Weight as of this encounter: 61.1 kg. Up with therapy    Labs reviewed.  Hg 8.8 following one unit of PRBC transfusion yesterday. Continue to monitor voiding. Hypokalemia resolved. Up with therapy today. Patient has had a BM. Will need SNF upon discharge.  Upon discharge patient will continue Lovenox 40mg  daily for 14 days. Follow-up with [facility] orthopaedics in two weeks for staple removal and repeat x-rays of the right femur.  DVT Prophylaxis - Lovenox and TED hose Weight-Bearing as tolerated to right leg  J. , PA-C [facility] Orthopaedic Surgery 12/17/2019, 11:42 AM

## 2019-12-17 NOTE — Progress Notes (Signed)
PROGRESS NOTE    [name]  DXA:[id number] DOB: 1927-12-18 DOA: 12/12/2019 PCP: Jamelle Haring, MD    Brief Narrative:  Deborah Coffin Griffinis a 86 y.o.femalewith medical history significant fordementia, chronic systolic heart failure, with dilated cardiomyopathy, last EF 25 to 30% 04/01/2018, nonrheumatic aortic insufficiency, HTN, HLD who suffered an unwitnessed fall in her home. S/p ORIF. Awaiting SNF    Consultants:   orthopedics  Procedures:   Antimicrobials:       Subjective: Pt is at baseline MS. Eating. +BM  Objective: Vitals:   12/16/19 1629 12/16/19 2103 12/17/19 0017 12/17/19 0718  BP: (!) 171/52 (!) 151/54 (!) 140/45 (!) 150/55  Pulse: 80 85 87 79  Resp: 18  18 16   Temp: 99.1 F (37.3 C)  99.2 F (37.3 C) 99.1 F (37.3 C)  TempSrc: Oral  Oral Oral  SpO2: 97%  95% 95%  Weight:      Height:        Intake/Output Summary (Last 24 hours) at 12/17/2019 0814 Last data filed at 12/17/2019 0215 Gross per 24 hour  Intake 697.5 ml  Output 900 ml  Net -202.5 ml   Filed Weights   12/12/19 2035  Weight: 61.1 kg    Examination:  General exam: Appears calm and comfortable  Respiratory system: Clear to auscultation. Respiratory effort normal. Cardiovascular system: S1 & S2 heard, RRR. No JVD, murmurs, rubs, gallops or clicks.  Gastrointestinal system: Abdomen is nondistended, soft and nontender. Normal bowel sounds heard. Central nervous system: Alert and awake, Grossly intact Extremities: no edema b/l Skin: warm, dry  Psychiatry: Mood & affect appropriate in current setting.     Data Reviewed: I have personally reviewed following labs and imaging studies  CBC: Recent Labs  Lab 12/12/19 2045 12/12/19 2045 12/13/19 0545 12/14/19 0503 12/15/19 0549 12/16/19 0413 12/16/19 1836  WBC 9.4  --  10.5 10.6* 10.0 10.3  --   NEUTROABS 5.5  --   --   --   --   --   --   HGB 12.1   < > 10.6* 8.4* 7.4* 6.2* 9.4*  HCT 36.6   < > 32.2*  24.5* 22.5* 18.9* 27.8*  MCV 89.7  --  91.5 88.8 93.0 90.9  --   PLT 258  --  220 205 186 190  --    < > = values in this interval not displayed.   Basic Metabolic Panel: Recent Labs  Lab 12/12/19 2045 12/13/19 0545 12/14/19 0503 12/15/19 0549 12/16/19 0413  NA 140 140 139 136 132*  K 2.9* 3.6 3.6 2.8* 3.7  CL 101 102 103 102 100  CO2 29 30 29 29 25   GLUCOSE 143* 166* 138* 141* 150*  BUN 13 12 14 14 15   CREATININE 0.73 0.66 0.86 0.78 0.77  CALCIUM 8.5* 8.4* 8.4* 8.5* 8.4*   GFR: Estimated Creatinine Clearance: 42.9 mL/min (by C-G formula based on SCr of 0.77 mg/dL). Liver Function Tests: No results for input(s): AST, ALT, ALKPHOS, BILITOT, PROT, ALBUMIN in the last 168 hours. No results for input(s): LIPASE, AMYLASE in the last 168 hours. No results for input(s): AMMONIA in the last 168 hours. Coagulation Profile: Recent Labs  Lab 12/13/19 0545  INR 0.9   Cardiac Enzymes: No results for input(s): CKTOTAL, CKMB, CKMBINDEX, TROPONINI in the last 168 hours. BNP (last 3 results) No results for input(s): PROBNP in the last 8760 hours. HbA1C: No results for input(s): HGBA1C in the last 72 hours. CBG: No results for  input(s): GLUCAP in the last 168 hours. Lipid Profile: No results for input(s): CHOL, HDL, LDLCALC, TRIG, CHOLHDL, LDLDIRECT in the last 72 hours. Thyroid Function Tests: No results for input(s): TSH, T4TOTAL, FREET4, T3FREE, THYROIDAB in the last 72 hours. Anemia Panel: No results for input(s): VITAMINB12, FOLATE, FERRITIN, TIBC, IRON, RETICCTPCT in the last 72 hours. Sepsis Labs: No results for input(s): PROCALCITON, LATICACIDVEN in the last 168 hours.  Recent Results (from the past 240 hour(s))  Respiratory Panel by RT PCR (Flu A&B, Covid) - Nasopharyngeal Swab     Status: None   Collection Time: 12/12/19  9:42 PM   Specimen: Nasopharyngeal Swab  Result Value Ref Range Status   SARS Coronavirus 2 by RT PCR NEGATIVE NEGATIVE Final    Comment:  (NOTE) SARS-CoV-2 target nucleic acids are NOT DETECTED.  The SARS-CoV-2 RNA is generally detectable in upper respiratoy specimens during the acute phase of infection. The lowest concentration of SARS-CoV-2 viral copies this assay can detect is 131 copies/mL. A negative result does not preclude SARS-Cov-2 infection and should not be used as the sole basis for treatment or other patient management decisions. A negative result may occur with  improper specimen collection/handling, submission of specimen other than nasopharyngeal swab, presence of viral mutation(s) within the areas targeted by this assay, and inadequate number of viral copies (<131 copies/mL). A negative result must be combined with clinical observations, patient history, and epidemiological information. The expected result is Negative.  Fact Sheet for Patients:  https://www.moore.com/  Fact Sheet for Healthcare Providers:  https://www.young.biz/  This test is no t yet approved or cleared by the [place] FDA and  has been authorized for detection and/or diagnosis of SARS-CoV-2 by FDA under an Emergency Use Authorization (EUA). This EUA will remain  in effect (meaning this test can be used) for the duration of the COVID-19 declaration under Section 564(b)(1) of the Act, 21 U.S.C. section 360bbb-3(b)(1), unless the authorization is terminated or revoked sooner.     Influenza A by PCR NEGATIVE NEGATIVE Final   Influenza B by PCR NEGATIVE NEGATIVE Final    Comment: (NOTE) The Xpert Xpress SARS-CoV-2/FLU/RSV assay is intended as an aid in  the diagnosis of influenza from Nasopharyngeal swab specimens and  should not be used as a sole basis for treatment. Nasal washings and  aspirates are unacceptable for Xpert Xpress SARS-CoV-2/FLU/RSV  testing.  Fact Sheet for Patients: https://www.moore.com/  Fact Sheet for Healthcare  Providers: https://www.young.biz/  This test is not yet approved or cleared by the [place] FDA and  has been authorized for detection and/or diagnosis of SARS-CoV-2 by  FDA under an Emergency Use Authorization (EUA). This EUA will remain  in effect (meaning this test can be used) for the duration of the  Covid-19 declaration under Section 564(b)(1) of the Act, 21  U.S.C. section 360bbb-3(b)(1), unless the authorization is  terminated or revoked. Performed at [facility], [address] [place], [place] [address]   Surgical PCR screen     Status: None   Collection Time: 12/13/19  1:22 AM   Specimen: Nasal Mucosa; Nasal Swab  Result Value Ref Range Status   MRSA, PCR NEGATIVE NEGATIVE Final   Staphylococcus aureus NEGATIVE NEGATIVE Final    Comment: (NOTE) The Xpert SA Assay (FDA approved for NASAL specimens in patients 15 years of age and older), is one component of a comprehensive surveillance program. It is not intended to diagnose infection nor to guide or monitor treatment. Performed at [facility], [address], [place], [place] [address]          Radiology Studies: No results found.      Scheduled Meds: . bisacodyl  10 mg Rectal Once  . calcium-vitamin D  1 tablet Oral Daily  . Chlorhexidine Gluconate Cloth  6 each Topical Daily  . docusate sodium  100 mg Oral BID  . enoxaparin (LOVENOX) injection  40 mg Subcutaneous Q24H  . feeding supplement  1 Container Oral TID BM  . furosemide  20 mg Oral Daily  . levothyroxine  75 mcg Oral Q0600  . memantine  10 mg Oral BID  . metoprolol succinate  25 mg Oral Q0600  . metoprolol succinate  50 mg Oral Q2200  . multivitamin with minerals  1 tablet Oral Daily  . polyethylene glycol  17 g Oral Daily  . senna  2 tablet Oral Daily   Continuous Infusions:  Assessment & Plan:   Principal Problem:   Closed right hip fracture [facility]) Active Problems:   Hypothyroidism    Dementia of the Alzheimer's type without behavioral disturbance (HCC)   HTN (hypertension)   Aortic insufficiency   Chronic systolic heart failure (HCC)   Fall at home, initial encounter   Preoperative clearance   Dilated cardiomyopathy (HCC)   Closed right hip fracture (HCC) Fall at home, initial encounter Preoperative clearance -Fall was unwitnessed, but suspect mechanical. Status post ORIF  10/3 Had BM Continue pain management and bowel regimen Lovenox and TED hose for DVT prophylaxis Will continue Lovenox 40 mEq subcu daily for 14 days for DVT prophylaxis Follow-up with [facility] in 2 weeks for staple removal and repeat x-ray of the right femur Weightbearing as tolerated to the right leg PT recommends SNF`     Hypokalemia- Supplemented, K is 3.5 today  Continue to monitor      Post op anemia-  No evidence of active bleed Hemoglobin 6.2 Status post 1 unit packed red blood cell on 10/2  Was given Lasix posttransfusion  Hemoglobin stable we'll continue to monitor     Urinary Retention-post op Foley was removed, but still with urinary retention after multiple bladder scans requiring I/O Foley was replaced on 10/2 due to urinary retention Will need voiding trial in 2 to 3 days as she ambulates more     Chronic systolic heart failure (HCC) Aortic insufficiency Dilated cardiomyopathy (HCC) -Last echocardiogram was 04/01/2018 that showed EF 25 to 30% Without acute exacerbation.  Clinically euvolemic, asymptomatic     Hypothyroidism Continue Synthroid  Dementia of the Alzheimer's type without behavioral disturbance (HCC) Continue Namenda  HTN (hypertension) -Continue metoprolol  Glaucoma -Continue eyedrops   DVT prophylaxis: Lovenox Code Status: DNR Family Communication: Granddaughter at bedside Status is: Inpatient  Remains inpatient appropriate because:Unsafe d/c plan   Dispo: The patient is from: Home               Anticipated d/c is to: SNF              Anticipated d/c date is: 1 day              Patient currently is medically stable to d/c. Bed ready at [facility] on monday            LOS: 5 days   Time spent: 35 minutes with >50% on coc    Lynn Ito, MD [facility] Pager [phone]  If 7PM-7AM, please contact night-coverage [email or web address] Password [email or web address] 12/17/2019, 8:14 AM

## 2019-12-17 NOTE — TOC Progression Note (Signed)
Transition of Care [facility]) - Progression Note    Patient Details  Name: [name] MRN: [id number] Date of Birth: June 15, 1927  Transition of Care [facility]) CM/SW Contact  Maud Deed, LCSW Phone Number: 12/17/2019, 3:30 PM  Clinical Narrative:    CSW started insurance authorization in [place] health portal, pt can go to [facility] on Monday   Expected Discharge Plan: Skilled Nursing Facility Barriers to Discharge: Continued Medical Work up  Expected Discharge Plan and Services Expected Discharge Plan: Skilled Nursing Facility       Living arrangements for the past 2 months: Single Family Home                                       Social Determinants of Health (SDOH) Interventions    Readmission Risk Interventions No flowsheet data found.

## 2019-12-18 ENCOUNTER — Ambulatory Visit: Payer: Medicare Other | Attending: Internal Medicine

## 2019-12-18 DIAGNOSIS — W19XXXS Unspecified fall, sequela: Secondary | ICD-10-CM

## 2019-12-18 DIAGNOSIS — Z23 Encounter for immunization: Secondary | ICD-10-CM

## 2019-12-18 LAB — POTASSIUM: Potassium: 3.3 mmol/L — ABNORMAL LOW (ref 3.5–5.1)

## 2019-12-18 LAB — RESPIRATORY PANEL BY RT PCR (FLU A&B, COVID)
Influenza A by PCR: NEGATIVE
Influenza B by PCR: NEGATIVE
SARS Coronavirus 2 by RT PCR: NEGATIVE

## 2019-12-18 MED ORDER — ADULT MULTIVITAMIN W/MINERALS CH: 1.0000 | ORAL_TABLET | Freq: Every day | ORAL | Status: DC

## 2019-12-18 MED ORDER — FUROSEMIDE 20 MG PO TABS
20.0000 mg | ORAL_TABLET | Freq: Once | ORAL | Status: AC
  Administered 2019-12-18: 20 mg via ORAL
  Filled 2019-12-18: qty 1

## 2019-12-18 MED ORDER — POLYETHYLENE GLYCOL 3350 17 G PO PACK: 17.0000 g | PACK | Freq: Every day | ORAL | 0 refills | Status: DC

## 2019-12-18 MED ORDER — POTASSIUM CHLORIDE CRYS ER 20 MEQ PO TBCR
40.0000 meq | EXTENDED_RELEASE_TABLET | Freq: Once | ORAL | Status: AC
  Administered 2019-12-18: 40 meq via ORAL
  Filled 2019-12-18: qty 2

## 2019-12-18 MED ORDER — SENNA 8.6 MG PO TABS: 2.0000 | ORAL_TABLET | Freq: Every day | ORAL | 0 refills | Status: DC

## 2019-12-18 MED ORDER — ACETAMINOPHEN 325 MG PO TABS: 325.0000 mg | ORAL_TABLET | Freq: Four times a day (QID) | ORAL | Status: AC | PRN

## 2019-12-18 MED ORDER — POTASSIUM CHLORIDE 10 MEQ/100ML IV SOLN: 10.0000 meq | INTRAVENOUS | Status: DC

## 2019-12-18 NOTE — TOC Progression Note (Signed)
Transition of Care [facility]) - Progression Note    Patient Details  Name: [name] MRN: [id number] Date of Birth: 1927/03/21  Transition of Care [facility]) CM/SW Contact  Trenton Founds, RN Phone Number: 12/18/2019, 8:07 AM  Clinical Narrative:   Insurance auth received through [facility], [facility] ID# [id number] good 10/3-10/6 with next review date of 10/6.     Expected Discharge Plan: Skilled Nursing Facility Barriers to Discharge: Continued Medical Work up  Expected Discharge Plan and Services Expected Discharge Plan: Skilled Nursing Facility       Living arrangements for the past 2 months: Single Family Home                                       Social Determinants of Health (SDOH) Interventions    Readmission Risk Interventions No flowsheet data found.

## 2019-12-18 NOTE — Discharge Summary (Signed)
[name] WVP:[id number] DOB: Jul 24, 1927 DOA: 12/12/2019  PCP: Jamelle Haring, MD  Admit date: 12/12/2019 Discharge date: 12/18/2019  Admitted From: home Disposition:  SNF  Recommendations for Outpatient Follow-up:  1. Follow up with PCP in 1 week 2. Please obtain BMP/CBC in  One day 3. Follow-up with [facility] orthopedics in 2 weeks for staple removal and repeat x-ray of the right femur 4. Will need Lovenox 40 mg subcu for 14 days for DVT prophylaxis 5. Also needs teds hose place at all times 6. Pt received 2nd dose of pfizer vaccine today for covid    Discharge Condition:Stable CODE STATUS: DNR Diet recommendation: Heart Healthy <2gm sodium diet Brief/Interim Summary: [name] is a 86 y.o. female with medical history significant for dementia, chronic systolic heart failure, with dilated cardiomyopathy, last EF 25 to 30% 04/01/2018, nonrheumatic aortic insufficiency, HTN, HLD who suffered an unwitnessed fall in her home.  She was found with right hip fracture.  Orthopedics was consulted.  Closed right hip fracture (HCC) Fall at home, initial encounter Preoperative clearance -Fall was unwitnessed, but suspect mechanical. Status post ORIF Voiding and having bowel movement Continue pain management and bowel regimen Lovenox and TED hose for DVT prophylaxis continue Lovenox 40 mEq subcu daily for 14 days for DVT prophylaxis Follow-up with [facility] in 2 weeks for staple removal and repeat x-ray of the right femur Weightbearing as tolerated to the right leg     Hypokalemia- K is 3.3 today.  Will give KCl 40 mEq prior to discharge    Post op anemia- No evidence of active bleed Hemoglobin 6.2 Status post 1 unit packed red blood cell on 10/2  H&H stable.  Will need CBC in 2 days to reevaluate her hemoglobin    Urinary Retention-post op Foley was removed, but still with urinary retention after multiple bladder scans requiring  I/O Foley was replaced on 10/2 due to urinary retention Now voiding on her own     Chronic systolic heart failure (HCC) Aortic insufficiency Dilated cardiomyopathy (HCC) -Last echocardiogram was 04/01/2018 that showed EF 25 to 30% Will give lasix 40mg  today, and go back to 20 daily. Continue cardiac meds as outpatient    Hypothyroidism Continue Synthroid  Dementia of the Alzheimer's type without behavioral disturbance (HCC) Continue Namenda  HTN (hypertension) -Continue metoprolol  Glaucoma -Continue eyedrops  Discharge Diagnoses:  Principal Problem:   Closed right hip fracture (HCC) Active Problems:   Hypothyroidism   Dementia of the Alzheimer's type without behavioral disturbance (HCC)   HTN (hypertension)   Aortic insufficiency   Chronic systolic heart failure (HCC)   Fall at home, initial encounter   Preoperative clearance   Dilated cardiomyopathy [facility])    Discharge Instructions  Discharge Instructions    Diet - low sodium heart healthy   Complete by: As directed    Increase activity slowly   Complete by: As directed    No wound care   Complete by: As directed      Allergies as of 12/18/2019   No Known Allergies     Medication List    TAKE these medications   acetaminophen 325 MG tablet Commonly known as: TYLENOL Take 1-2 tablets (325-650 mg total) by mouth every 6 (six) hours as needed for mild pain (pain score 1-3 or temp > 100.5).   aspirin EC 81 MG tablet Take 81 mg by mouth daily. Swallow whole.   bimatoprost 0.01 % Soln Commonly known as: LUMIGAN Place 1 drop into both  eyes at bedtime.   brinzolamide 1 % ophthalmic suspension Commonly known as: AZOPT Place 1 drop into both eyes 2 (two) times daily.   Calcium 600-200 MG-UNIT tablet Take 1 tablet by mouth daily.   enoxaparin 40 MG/0.4ML injection Commonly known as: LOVENOX Inject 0.4 mLs (40 mg total) into the skin daily.   Euthyrox 75 MCG tablet Generic drug:  levothyroxine TAKE 1 TABLET BY MOUTH ONCE DAILY BEFORE BREAKFAST   furosemide 20 MG tablet Commonly known as: LASIX Take 20 mg by mouth daily.   memantine 10 MG tablet Commonly known as: NAMENDA Take 1 tablet by mouth twice daily What changed: how much to take   metoprolol succinate 25 MG 24 hr tablet Commonly known as: TOPROL-XL TAKE 1 TABLET BY MOUTH IN THE MORNING AND 2 NIGHTLY   multivitamin with minerals Tabs tablet Take 1 tablet by mouth daily.   polyethylene glycol 17 g packet Commonly known as: MIRALAX / GLYCOLAX Take 17 g by mouth daily.   rosuvastatin 5 MG tablet Commonly known as: CRESTOR TAKE 1 TABLET BY MOUTH AT BEDTIME   senna 8.6 MG Tabs tablet Commonly known as: SENOKOT Take 2 tablets (17.2 mg total) by mouth daily.   traMADol 50 MG tablet Commonly known as: ULTRAM Take 1 tablet (50 mg total) by mouth every 6 (six) hours as needed for moderate pain.   VITAMIN D (ERGOCALCIFEROL) PO Take 2,000 tablets by mouth daily.       Contact information for follow-up providers    Anson Oregon, PA-C Follow up in 14 day(s).   Specialty: Physician Assistant Why: Mindi Slicker information: [address] [place] [address] [phone]        Jamelle Haring, MD Follow up in 1 week(s).   Specialty: Internal Medicine Why: cbc Contact information: [address] [place] [address] [phone]            Contact information for after-discharge care    Destination    [facility] Preferred SNF .   Service: Skilled Nursing Contact information: [address] [place] [address] [phone]                 No Known Allergies  Consultations:   Orthopedics  Procedures/Studies: DG Chest 1 View  Result Date: 12/12/2019 CLINICAL DATA:  Fall EXAM: CHEST  1 VIEW COMPARISON:  None. FINDINGS: Diffuse bilateral reticular opacity greatest at the  bases and periphery suggesting pulmonary fibrosis. No acute consolidation, pleural effusion or pneumothorax. Cardiac size within normal limits. Retrocardiac opacity, question hiatal hernia. Mild convex right paratracheal opacity. No pneumothorax. Possible left mid clavicle deformity but limited evaluation secondary to osseous overlap. IMPRESSION: 1. Findings suspicious for pulmonary fibrosis. No acute infiltrate, edema or pneumothorax. 2. Possible fracture deformity of the mid left clavicle, age indeterminate. Consider dedicated left clavicle views if painful here. 3. Possible hiatal hernia. 4. Slight convex opacity in the right paratracheal region, likely augmented by rotation and position, recommend dedicated two view chest to assess for persistence. Electronically Signed   By: Jasmine Pang M.D.   On: 12/12/2019 21:23   CT Head Wo Contrast  Result Date: 12/12/2019 CLINICAL DATA:  Unwitnessed fall. EXAM: CT HEAD WITHOUT CONTRAST TECHNIQUE: Contiguous axial images were obtained from the base of the skull through the vertex without intravenous contrast. COMPARISON:  December 23, 2011 FINDINGS: Brain: There is mild cerebral atrophy with widening of the extra-axial spaces and ventricular dilatation. There are areas  of decreased attenuation within the white matter tracts of the supratentorial brain, consistent with microvascular disease changes. Vascular: No hyperdense vessel or unexpected calcification. Skull: Normal. Negative for fracture or focal lesion. Sinuses/Orbits: No acute finding. Other: None. IMPRESSION: 1. Generalized cerebral atrophy. 2. No acute intracranial abnormality. Electronically Signed   By: Aram Candelahaddeus  Houston M.D.   On: 12/12/2019 21:32   CT Cervical Spine Wo Contrast  Result Date: 12/12/2019 CLINICAL DATA:  Status post fall. EXAM: CT CERVICAL SPINE WITHOUT CONTRAST TECHNIQUE: Multidetector CT imaging of the cervical spine was performed without intravenous contrast. Multiplanar CT image  reconstructions were also generated. COMPARISON:  None. FINDINGS: Alignment: Normal. Skull base and vertebrae: No acute fracture. No primary bone lesion or focal pathologic process. Soft tissues and spinal canal: No prevertebral fluid or swelling. No visible canal hematoma. Disc levels: Mild endplate sclerosis is seen at the level of C5-C6. Mild to moderate severity intervertebral disc space narrowing is also seen at this level. Mild intervertebral disc space narrowing is noted throughout the remainder of the cervical spine. Mild to moderate severity bilateral multilevel facet joint hypertrophy is noted. Upper chest: Negative. Other: None. IMPRESSION: 1. Mild to moderate severity degenerative changes of the cervical spine, most prominent at the level of C5-C6. 2. No evidence of an acute fracture or subluxation. Electronically Signed   By: Aram Candelahaddeus  Houston M.D.   On: 12/12/2019 21:35   DG HIP OPERATIVE UNILAT W OR W/O PELVIS RIGHT  Result Date: 12/13/2019 CLINICAL DATA:  Proximal right femoral fracture EXAM: OPERATIVE RIGHT HIP WITH PELVIS COMPARISON:  12/12/2019 FLUOROSCOPY TIME:  Radiation Exposure Index (as provided by the fluoroscopic device): 12.2 mGy If the device does not provide the exposure index: Fluoroscopy Time:  1 minutes 12 seconds Number of Acquired Images:  4 FINDINGS: Four spot films were obtained intraoperatively and reveal a medullary rod in the right femur. Fracture fragments have been reduced. Proximal and distal fixation screws are noted. IMPRESSION: Status post ORIF of proximal right femoral fracture. Electronically Signed   By: Alcide CleverMark  Lukens M.D.   On: 12/13/2019 13:28   DG Hip Unilat W or Wo Pelvis 2-3 Views Right  Result Date: 12/12/2019 CLINICAL DATA:  Fall EXAM: DG HIP (WITH OR WITHOUT PELVIS) 2-3V RIGHT COMPARISON:  None. FINDINGS: There is a comminuted intertrochanteric fracture of the proximal right femur. No dislocation of the femoral head. No other pelvic fracture. IMPRESSION:  Comminuted intertrochanteric fracture of the proximal right femur. Electronically Signed   By: Deatra RobinsonKevin  Herman M.D.   On: 12/12/2019 21:21      Subjective: No complaints. Family at bedside, family states pt little sob  Discharge Exam: Vitals:   12/17/19 1529 12/18/19 0012  BP: (!) 153/65 (!) 135/53  Pulse: 88 81  Resp: 20 18  Temp: 99.3 F (37.4 C) 98.7 F (37.1 C)  SpO2: 95% 93%   Vitals:   12/17/19 0017 12/17/19 0718 12/17/19 1529 12/18/19 0012  BP: (!) 140/45 (!) 150/55 (!) 153/65 (!) 135/53  Pulse: 87 79 88 81  Resp: 18 16 20 18   Temp: 99.2 F (37.3 C) 99.1 F (37.3 C) 99.3 F (37.4 C) 98.7 F (37.1 C)  TempSrc: Oral Oral Oral Oral  SpO2: 95% 95% 95% 93%  Weight:      Height:        General: Pt is awake, not in acute distress Cardiovascular: RRR, S1/S2 +, no rubs, no gallops Respiratory: CTA bilaterally, no wheezing, no rhonchi Abdominal: Soft, NT, ND, bowel sounds + Extremities:  no edema, no cyanosis    The results of significant diagnostics from this hospitalization (including imaging, microbiology, ancillary and laboratory) are listed below for reference.     Microbiology: Recent Results (from the past 240 hour(s))  Respiratory Panel by RT PCR (Flu A&B, Covid) - Nasopharyngeal Swab     Status: None   Collection Time: 12/12/19  9:42 PM   Specimen: Nasopharyngeal Swab  Result Value Ref Range Status   SARS Coronavirus 2 by RT PCR NEGATIVE NEGATIVE Final    Comment: (NOTE) SARS-CoV-2 target nucleic acids are NOT DETECTED.  The SARS-CoV-2 RNA is generally detectable in upper respiratoy specimens during the acute phase of infection. The lowest concentration of SARS-CoV-2 viral copies this assay can detect is 131 copies/mL. A negative result does not preclude SARS-Cov-2 infection and should not be used as the sole basis for treatment or other patient management decisions. A negative result may occur with  improper specimen collection/handling, submission of  specimen other than nasopharyngeal swab, presence of viral mutation(s) within the areas targeted by this assay, and inadequate number of viral copies (<131 copies/mL). A negative result must be combined with clinical observations, patient history, and epidemiological information. The expected result is Negative.  Fact Sheet for Patients:  https://www.moore.com/  Fact Sheet for Healthcare Providers:  https://www.young.biz/  This test is no t yet approved or cleared by the [place] FDA and  has been authorized for detection and/or diagnosis of SARS-CoV-2 by FDA under an Emergency Use Authorization (EUA). This EUA will remain  in effect (meaning this test can be used) for the duration of the COVID-19 declaration under Section 564(b)(1) of the Act, 21 U.S.C. section 360bbb-3(b)(1), unless the authorization is terminated or revoked sooner.     Influenza A by PCR NEGATIVE NEGATIVE Final   Influenza B by PCR NEGATIVE NEGATIVE Final    Comment: (NOTE) The Xpert Xpress SARS-CoV-2/FLU/RSV assay is intended as an aid in  the diagnosis of influenza from Nasopharyngeal swab specimens and  should not be used as a sole basis for treatment. Nasal washings and  aspirates are unacceptable for Xpert Xpress SARS-CoV-2/FLU/RSV  testing.  Fact Sheet for Patients: https://www.moore.com/  Fact Sheet for Healthcare Providers: https://www.young.biz/  This test is not yet approved or cleared by the [place] FDA and  has been authorized for detection and/or diagnosis of SARS-CoV-2 by  FDA under an Emergency Use Authorization (EUA). This EUA will remain  in effect (meaning this test can be used) for the duration of the  Covid-19 declaration under Section 564(b)(1) of the Act, 21  U.S.C. section 360bbb-3(b)(1), unless the authorization is  terminated or revoked. Performed at [facility], [address] [place], [place] [address]   Surgical PCR screen     Status: None   Collection Time: 12/13/19  1:22 AM   Specimen: Nasal Mucosa; Nasal Swab  Result Value Ref Range Status   MRSA, PCR NEGATIVE NEGATIVE Final   Staphylococcus aureus NEGATIVE NEGATIVE Final    Comment: (NOTE) The Xpert SA Assay (FDA approved for NASAL specimens in patients 43 years of age and older), is one component of a comprehensive surveillance program. It is not intended to diagnose infection nor to guide or monitor treatment. Performed at [facility], [address] [place], [place] [address]      Labs: BNP (last 3 results) No results for input(s): BNP in the last 8760 hours. Basic Metabolic Panel: Recent Labs  Lab 12/13/19 0545 12/13/19 0545 12/14/19 0503  12/15/19 0549 12/16/19 0413 12/17/19 0829 12/18/19 0405  NA 140  --  139 136 132* 134*  --   K 3.6   < > 3.6 2.8* 3.7 3.5 3.3*  CL 102  --  103 102 100 99  --   CO2 30  --  --   GLUCOSE 166*  --  138* 141* 150* 128*  --   BUN 12  --  --   CREATININE 0.66  --  0.86 0.78 0.77 0.64  --   CALCIUM 8.4*  --  8.4* 8.5* 8.4* 8.6*  --    < > = values in this interval not displayed.   Liver Function Tests: No results for input(s): AST, ALT, ALKPHOS, BILITOT, PROT, ALBUMIN in the last 168 hours. No results for input(s): LIPASE, AMYLASE in the last 168 hours. No results for input(s): AMMONIA in the last 168 hours. CBC: Recent Labs  Lab 12/12/19 2045 12/12/19 2045 12/13/19 0545 12/13/19 0545 12/14/19 0503 12/15/19 0549 12/16/19 0413 12/16/19 1836 12/17/19 0829  WBC 9.4   < > 10.5  --  10.6* 10.0 10.3  --  11.9*  NEUTROABS 5.5  --   --   --   --   --   --   --   --   HGB 12.1   < > 10.6*   < > 8.4* 7.4* 6.2* 9.4* 8.8*  HCT 36.6   < > 32.2*   < > 24.5* 22.5* 18.9* 27.8* 26.2*  MCV 89.7   < > 91.5  --  88.8 93.0 90.9  --  92.3  PLT 258   < > 220  --  205 186 190  --  230   < > = values in this interval not  displayed.   Cardiac Enzymes: No results for input(s): CKTOTAL, CKMB, CKMBINDEX, TROPONINI in the last 168 hours. BNP: Invalid input(s): POCBNP CBG: No results for input(s): GLUCAP in the last 168 hours. D-Dimer No results for input(s): DDIMER in the last 72 hours. Hgb A1c No results for input(s): HGBA1C in the last 72 hours. Lipid Profile No results for input(s): CHOL, HDL, LDLCALC, TRIG, CHOLHDL, LDLDIRECT in the last 72 hours. Thyroid function studies No results for input(s): TSH, T4TOTAL, T3FREE, THYROIDAB in the last 72 hours.  Invalid input(s): FREET3 Anemia work up No results for input(s): VITAMINB12, FOLATE, FERRITIN, TIBC, IRON, RETICCTPCT in the last 72 hours. Urinalysis    Component Value Date/Time   COLORURINE Yellow 12/23/2011 1524   APPEARANCEUR Clear 12/23/2011 1524   LABSPEC 1.013 12/23/2011 1524   PHURINE 5.0 12/23/2011 1524   GLUCOSEU Negative 12/23/2011 1524   HGBUR Negative 12/23/2011 1524   BILIRUBINUR Negative 12/23/2011 1524   KETONESUR Negative 12/23/2011 1524   PROTEINUR Negative 12/23/2011 1524   NITRITE Negative 12/23/2011 1524   LEUKOCYTESUR 1+ 12/23/2011 1524   Sepsis Labs Invalid input(s): PROCALCITONIN,  WBC,  LACTICIDVEN Microbiology Recent Results (from the past 240 hour(s))  Respiratory Panel by RT PCR (Flu A&B, Covid) - Nasopharyngeal Swab     Status: None   Collection Time: 12/12/19  9:42 PM   Specimen: Nasopharyngeal Swab  Result Value Ref Range Status   SARS Coronavirus 2 by RT PCR NEGATIVE NEGATIVE Final    Comment: (NOTE) SARS-CoV-2 target nucleic acids are NOT DETECTED.  The SARS-CoV-2 RNA is generally detectable in upper respiratoy specimens during the acute phase of infection. The lowest concentration of SARS-CoV-2 viral copies this  assay can detect is 131 copies/mL. A negative result does not preclude SARS-Cov-2 infection and should not be used as the sole basis for treatment or other patient management decisions. A  negative result may occur with  improper specimen collection/handling, submission of specimen other than nasopharyngeal swab, presence of viral mutation(s) within the areas targeted by this assay, and inadequate number of viral copies (<131 copies/mL). A negative result must be combined with clinical observations, patient history, and epidemiological information. The expected result is Negative.  Fact Sheet for Patients:  https://www.moore.com/  Fact Sheet for Healthcare Providers:  https://www.young.biz/  This test is no t yet approved or cleared by the [place] FDA and  has been authorized for detection and/or diagnosis of SARS-CoV-2 by FDA under an Emergency Use Authorization (EUA). This EUA will remain  in effect (meaning this test can be used) for the duration of the COVID-19 declaration under Section 564(b)(1) of the Act, 21 U.S.C. section 360bbb-3(b)(1), unless the authorization is terminated or revoked sooner.     Influenza A by PCR NEGATIVE NEGATIVE Final   Influenza B by PCR NEGATIVE NEGATIVE Final    Comment: (NOTE) The Xpert Xpress SARS-CoV-2/FLU/RSV assay is intended as an aid in  the diagnosis of influenza from Nasopharyngeal swab specimens and  should not be used as a sole basis for treatment. Nasal washings and  aspirates are unacceptable for Xpert Xpress SARS-CoV-2/FLU/RSV  testing.  Fact Sheet for Patients: https://www.moore.com/  Fact Sheet for Healthcare Providers: https://www.young.biz/  This test is not yet approved or cleared by the [place] FDA and  has been authorized for detection and/or diagnosis of SARS-CoV-2 by  FDA under an Emergency Use Authorization (EUA). This EUA will remain  in effect (meaning this test can be used) for the duration of the  Covid-19 declaration under Section 564(b)(1) of the Act, 21  U.S.C. section 360bbb-3(b)(1), unless the authorization  is  terminated or revoked. Performed at [facility], [address], [place], [place] [address]   Surgical PCR screen     Status: None   Collection Time: 12/13/19  1:22 AM   Specimen: Nasal Mucosa; Nasal Swab  Result Value Ref Range Status   MRSA, PCR NEGATIVE NEGATIVE Final   Staphylococcus aureus NEGATIVE NEGATIVE Final    Comment: (NOTE) The Xpert SA Assay (FDA approved for NASAL specimens in patients 75 years of age and older), is one component of a comprehensive surveillance program. It is not intended to diagnose infection nor to guide or monitor treatment. Performed at [facility], [address] [place], [place] [address]      Time coordinating discharge: Over 30 minutes  SIGNED:   Lynn Ito, MD  [facility] 12/18/2019, 9:57 AM Pager   If 7PM-7AM, please contact night-coverage [email or web address] Password [email or web address]

## 2019-12-18 NOTE — Progress Notes (Signed)
OT Cancellation Note  Patient Details Name: [name] MRN: [id number] DOB: April 24, 1927   Cancelled Treatment:    Reason Eval/Treat Not Completed: Patient declined, no reason specified   OTR attempted to engage pt in treatment session, but pt and family politely declined as they were currently visiting.  Pt agreeable to OTR returning at next opportunity.  Kathyrn Drown Nasha Diss, OTR/L 12/18/19, 9:58 AM

## 2019-12-18 NOTE — Care Management Important Message (Signed)
Important Message  Patient Details  Name: [name] MRN: [id number] Date of Birth: 12-17-1927   Medicare Important Message Given:  Yes     [name] 12/18/2019, 11:11 AM

## 2019-12-18 NOTE — Plan of Care (Signed)
Pt stood at edge of bed last night 2 assist. Problem: Education: Goal: Knowledge of General Education information will improve Description: Including pain rating scale, medication(s)/side effects and non-pharmacologic comfort measures Outcome: Progressing   Problem: Health Behavior/Discharge Planning: Goal: Ability to manage health-related needs will improve Outcome: Progressing   Problem: Clinical Measurements: Goal: Ability to maintain clinical measurements within normal limits will improve Outcome: Progressing Goal: Will remain free from infection Outcome: Progressing Goal: Diagnostic test results will improve Outcome: Progressing Goal: Respiratory complications will improve Outcome: Progressing Goal: Cardiovascular complication will be avoided Outcome: Progressing   Problem: Activity: Goal: Risk for activity intolerance will decrease Outcome: Progressing   Problem: Nutrition: Goal: Adequate nutrition will be maintained Outcome: Progressing   Problem: Coping: Goal: Level of anxiety will decrease Outcome: Progressing   Problem: Elimination: Goal: Will not experience complications related to bowel motility Outcome: Progressing Goal: Will not experience complications related to urinary retention Outcome: Progressing   Problem: Pain Managment: Goal: General experience of comfort will improve Outcome: Progressing   Problem: Safety: Goal: Ability to remain free from injury will improve Outcome: Progressing   Problem: Skin Integrity: Goal: Risk for impaired skin integrity will decrease Outcome: Progressing

## 2019-12-18 NOTE — Progress Notes (Signed)
   Covid-19 Vaccination Clinic  Name:  FINNLEIGH MARCHETTI    MRN: [id number] DOB: 04/11/1927  12/18/2019  Ms. Friedlander was observed post Covid-19 immunization for 15 minutes without incident. She was provided with Vaccine Information Sheet and instruction to access the V-Safe system.   Ms. Hebert was instructed to call [phone] with any severe reactions post vaccine: Marland Kitchen Difficulty breathing  . Swelling of face and throat  . A fast heartbeat  . A bad rash all over body  . Dizziness and weakness

## 2019-12-18 NOTE — Progress Notes (Signed)
Subjective: 5 Days Post-Op Procedure(s) (LRB): INTRAMEDULLARY (IM) NAIL INTERTROCHANTRIC (Right) Patient reports her pain is mild at the moment. Patient is well, and has had no acute complaints or problems Continue to work with PT, plan for discharge to SNF today. Negative for chest pain and shortness of breath Fever: no Gastrointestinal:Negative for nausea and vomiting  Objective: Vital signs in last 24 hours: Temp:  [98.7 F (37.1 C)-99.3 F (37.4 C)] 98.7 F (37.1 C) (10/04 0012) Pulse Rate:  [81-88] 81 (10/04 0012) Resp:  [18-20] 18 (10/04 0012) BP: (135-153)/(53-65) 135/53 (10/04 0012) SpO2:  [93 %-95 %] 93 % (10/04 0012)  Intake/Output from previous day:  Intake/Output Summary (Last 24 hours) at 12/18/2019 0750 Last data filed at 12/18/2019 0400 Gross per 24 hour  Intake 240 ml  Output 200 ml  Net 40 ml    Intake/Output this shift: No intake/output data recorded.  Labs: Recent Labs    12/16/19 0413 12/16/19 1836 12/17/19 0829  HGB 6.2* 9.4* 8.8*   Recent Labs    12/16/19 0413 12/16/19 0413 12/16/19 1836 12/17/19 0829  WBC 10.3  --   --  11.9*  RBC 2.08*  --   --  2.84*  HCT 18.9*   < > 27.8* 26.2*  PLT 190  --   --  230   < > = values in this interval not displayed.   Recent Labs    12/16/19 0413 12/16/19 0413 12/17/19 0829 12/18/19 0405  NA 132*  --  134*  --   K 3.7   < > 3.5 3.3*  CL 100  --  99  --   CO2 25  --  28  --   BUN 15  --  12  --   CREATININE 0.77  --  0.64  --   GLUCOSE 150*  --  128*  --   CALCIUM 8.4*  --  8.6*  --    < > = values in this interval not displayed.   No results for input(s): LABPT, INR in the last 72 hours.   EXAM General - Patient is alert and oriented this AM.  Able to answer simple questions this morning. Extremity - ABD soft Sensation intact distally Dorsiflexion/Plantar flexion intact Incision: dressing C/D/I No cellulitis present Dressing/Incision - Mild serosanguinous drainage to the middle  incision.  Ecchymosis to the right proximal incision. Motor Function - intact, moving foot and toes well on exam.  Bowel sounds intact, abdomen soft to palpation.  Past Medical History:  Diagnosis Date  . Dementia (HCC)   . Glaucoma   . HTN (hypertension) 02/10/2017  . Osteoporosis   . Thyroid disease     Assessment/Plan: 5 Days Post-Op Procedure(s) (LRB): INTRAMEDULLARY (IM) NAIL INTERTROCHANTRIC (Right) Principal Problem:   Closed right hip fracture (HCC) Active Problems:   Hypothyroidism   Dementia of the Alzheimer's type without behavioral disturbance (HCC)   HTN (hypertension)   Aortic insufficiency   Chronic systolic heart failure (HCC)   Fall at home, initial encounter   Preoperative clearance   Dilated cardiomyopathy (HCC)  Estimated body mass index is 22.42 kg/m as calculated from the following:   Height as of this encounter: 5\' 5"  (1.651 m).   Weight as of this encounter: 61.1 kg. Up with therapy    Labs reviewed.  K+ 3.3 this AM will supplement. Patient has had a BM. Patient to received her second dose of the vaccine today before leaving for SNF. Will place order for STAT COVID test  today for discharge to SNF.  Upon discharge patient will continue Lovenox 40mg  daily for 14 days. Follow-up with [facility] orthopaedics in two weeks for staple removal and repeat x-rays of the right femur.  DVT Prophylaxis - Lovenox and TED hose Weight-Bearing as tolerated to right leg  [facility], PA-C [facility] 12/18/2019, 7:50 AM

## 2019-12-18 NOTE — Discharge Planning (Signed)
IV removed.  Discharge AVS printed and placed in facility packet with signed DNR.  Family in room and aware of DC to facility.  Report called and s/w Victorino Dike Barns, LPN at [facility].  Transportation arranged - arriving in about 1 hr to take to room 29.

## 2019-12-19 ENCOUNTER — Ambulatory Visit: Payer: Medicare Other

## 2020-01-04 ENCOUNTER — Ambulatory Visit: Payer: Self-pay | Admitting: *Deleted

## 2020-01-04 NOTE — Chronic Care Management (AMB) (Signed)
CCM enrollment status changed to previously enrolled.   Coy Rochford, LCSW Clinical Social Worker  [facility] Care Management [phone]  

## 2020-01-09 ENCOUNTER — Encounter: Payer: Self-pay | Admitting: Internal Medicine

## 2020-01-10 ENCOUNTER — Telehealth: Payer: Self-pay

## 2020-01-10 NOTE — Telephone Encounter (Signed)
RE the ongoing conversation, Kathie Rhodes, pt daughter is stating she needs paperwork filled out for her work. She is wanting an estimate turnaround and states her daughter delivered additional paperwork this morning. FU at [phone].

## 2020-01-11 ENCOUNTER — Telehealth: Payer: Self-pay | Admitting: Internal Medicine

## 2020-01-11 NOTE — Telephone Encounter (Unsigned)
Copied from CRM [id number]. Topic: Quick Communication - Home Health Verbal Orders >> Jan 11, 2020  3:42 PM Marylen Ponto wrote: Caller/Agency: Judeth Cornfield with [facility] Callback Number: [phone] Requesting OT/PT/Skilled Nursing/Social Work/Speech Therapy: OT Frequency: 1 week 1, 2 week 2, and 1 week 1

## 2020-01-15 NOTE — Telephone Encounter (Signed)
[name] with [facility] for approval of verbal orders for OT.

## 2020-01-15 NOTE — Telephone Encounter (Signed)
Please provide a verbal order for this as requested. Thanks

## 2020-01-19 ENCOUNTER — Encounter: Payer: Self-pay | Admitting: Internal Medicine

## 2020-01-19 ENCOUNTER — Encounter: Payer: Self-pay | Admitting: Emergency Medicine

## 2020-01-19 ENCOUNTER — Other Ambulatory Visit: Payer: Self-pay

## 2020-01-19 ENCOUNTER — Ambulatory Visit
Admission: RE | Admit: 2020-01-19 | Discharge: 2020-01-19 | Disposition: A | Discharge status: Home / Self Care | Payer: Medicare Other | Source: Ambulatory Visit | Attending: Internal Medicine | Admitting: Internal Medicine

## 2020-01-19 ENCOUNTER — Inpatient Hospital Stay
Admission: EM | Admit: 2020-01-19 | Discharge: 2020-01-22 | DRG: 300 | Disposition: A | Discharge status: Home Health | Payer: Medicare Other | Attending: Internal Medicine | Admitting: Internal Medicine

## 2020-01-19 ENCOUNTER — Ambulatory Visit (INDEPENDENT_AMBULATORY_CARE_PROVIDER_SITE_OTHER): Payer: Medicare Other | Admitting: Internal Medicine

## 2020-01-19 VITALS — BP 110/68 | HR 69 | Temp 98.1°F | Resp 16 | Ht 65.0 in | Wt 134.0 lb

## 2020-01-19 DIAGNOSIS — Z79899 Other long term (current) drug therapy: Secondary | ICD-10-CM

## 2020-01-19 DIAGNOSIS — I10 Essential (primary) hypertension: Secondary | ICD-10-CM

## 2020-01-19 DIAGNOSIS — E878 Other disorders of electrolyte and fluid balance, not elsewhere classified: Secondary | ICD-10-CM | POA: Diagnosis present

## 2020-01-19 DIAGNOSIS — I82412 Acute embolism and thrombosis of left femoral vein: Principal | ICD-10-CM | POA: Diagnosis present

## 2020-01-19 DIAGNOSIS — Z23 Encounter for immunization: Secondary | ICD-10-CM | POA: Diagnosis not present

## 2020-01-19 DIAGNOSIS — H409 Unspecified glaucoma: Secondary | ICD-10-CM | POA: Diagnosis present

## 2020-01-19 DIAGNOSIS — E785 Hyperlipidemia, unspecified: Secondary | ICD-10-CM | POA: Diagnosis present

## 2020-01-19 DIAGNOSIS — Z7982 Long term (current) use of aspirin: Secondary | ICD-10-CM

## 2020-01-19 DIAGNOSIS — Z66 Do not resuscitate: Secondary | ICD-10-CM | POA: Diagnosis present

## 2020-01-19 DIAGNOSIS — Z20822 Contact with and (suspected) exposure to covid-19: Secondary | ICD-10-CM | POA: Diagnosis present

## 2020-01-19 DIAGNOSIS — I110 Hypertensive heart disease with heart failure: Secondary | ICD-10-CM | POA: Diagnosis present

## 2020-01-19 DIAGNOSIS — I420 Dilated cardiomyopathy: Secondary | ICD-10-CM | POA: Diagnosis present

## 2020-01-19 DIAGNOSIS — G309 Alzheimer's disease, unspecified: Secondary | ICD-10-CM

## 2020-01-19 DIAGNOSIS — I351 Nonrheumatic aortic (valve) insufficiency: Secondary | ICD-10-CM

## 2020-01-19 DIAGNOSIS — I82432 Acute embolism and thrombosis of left popliteal vein: Secondary | ICD-10-CM | POA: Diagnosis present

## 2020-01-19 DIAGNOSIS — I82402 Acute embolism and thrombosis of unspecified deep veins of left lower extremity: Secondary | ICD-10-CM | POA: Diagnosis not present

## 2020-01-19 DIAGNOSIS — F0280 Dementia in other diseases classified elsewhere without behavioral disturbance: Secondary | ICD-10-CM | POA: Diagnosis present

## 2020-01-19 DIAGNOSIS — E039 Hypothyroidism, unspecified: Secondary | ICD-10-CM | POA: Diagnosis present

## 2020-01-19 DIAGNOSIS — E871 Hypo-osmolality and hyponatremia: Secondary | ICD-10-CM | POA: Diagnosis not present

## 2020-01-19 DIAGNOSIS — Z9181 History of falling: Secondary | ICD-10-CM

## 2020-01-19 DIAGNOSIS — L89152 Pressure ulcer of sacral region, stage 2: Secondary | ICD-10-CM | POA: Diagnosis present

## 2020-01-19 DIAGNOSIS — Z8781 Personal history of (healed) traumatic fracture: Secondary | ICD-10-CM | POA: Insufficient documentation

## 2020-01-19 DIAGNOSIS — L8942 Pressure ulcer of contiguous site of back, buttock and hip, stage 2: Secondary | ICD-10-CM | POA: Diagnosis not present

## 2020-01-19 DIAGNOSIS — M7989 Other specified soft tissue disorders: Secondary | ICD-10-CM

## 2020-01-19 DIAGNOSIS — I5022 Chronic systolic (congestive) heart failure: Secondary | ICD-10-CM | POA: Diagnosis present

## 2020-01-19 DIAGNOSIS — L8990 Pressure ulcer of unspecified site, unspecified stage: Secondary | ICD-10-CM | POA: Insufficient documentation

## 2020-01-19 LAB — CBC WITH DIFFERENTIAL/PLATELET
Abs Immature Granulocytes: 0.05 10*3/uL (ref 0.00–0.07)
Basophils Absolute: 0.1 10*3/uL (ref 0.0–0.1)
Basophils Relative: 1 %
Eosinophils Absolute: 0.3 10*3/uL (ref 0.0–0.5)
Eosinophils Relative: 4 %
HCT: 35.3 % — ABNORMAL LOW (ref 36.0–46.0)
Hemoglobin: 11.5 g/dL — ABNORMAL LOW (ref 12.0–15.0)
Immature Granulocytes: 1 %
Lymphocytes Relative: 19 %
Lymphs Abs: 1.5 10*3/uL (ref 0.7–4.0)
MCH: 30.9 pg (ref 26.0–34.0)
MCHC: 32.6 g/dL (ref 30.0–36.0)
MCV: 94.9 fL (ref 80.0–100.0)
Monocytes Absolute: 0.8 10*3/uL (ref 0.1–1.0)
Monocytes Relative: 10 %
Neutro Abs: 5.4 10*3/uL (ref 1.7–7.7)
Neutrophils Relative %: 65 %
Platelets: 305 10*3/uL (ref 150–400)
RBC: 3.72 MIL/uL — ABNORMAL LOW (ref 3.87–5.11)
RDW: 14.3 % (ref 11.5–15.5)
WBC: 8.2 10*3/uL (ref 4.0–10.5)
nRBC: 0 % (ref 0.0–0.2)

## 2020-01-19 LAB — COMPREHENSIVE METABOLIC PANEL
ALT: 9 U/L (ref 0–44)
AST: 16 U/L (ref 15–41)
Albumin: 3.4 g/dL — ABNORMAL LOW (ref 3.5–5.0)
Alkaline Phosphatase: 102 U/L (ref 38–126)
Anion gap: 9 (ref 5–15)
BUN: 8 mg/dL (ref 8–23)
CO2: 27 mmol/L (ref 22–32)
Calcium: 9.4 mg/dL (ref 8.9–10.3)
Chloride: 93 mmol/L — ABNORMAL LOW (ref 98–111)
Creatinine, Ser: 0.73 mg/dL (ref 0.44–1.00)
GFR, Estimated: >60 mL/min (ref >60)
Glucose, Bld: 120 mg/dL — ABNORMAL HIGH (ref 70–99)
Potassium: 3.8 mmol/L (ref 3.5–5.1)
Sodium: 129 mmol/L — ABNORMAL LOW (ref 135–145)
Total Bilirubin: 1 mg/dL (ref 0.3–1.2)
Total Protein: 7.3 g/dL (ref 6.5–8.1)

## 2020-01-19 LAB — PROTIME-INR
INR: 1.1 (ref 0.8–1.2)
Prothrombin Time: 13.9 seconds (ref 11.4–15.2)

## 2020-01-19 LAB — RESPIRATORY PANEL BY RT PCR (FLU A&B, COVID)
Influenza A by PCR: NEGATIVE
Influenza B by PCR: NEGATIVE
SARS Coronavirus 2 by RT PCR: NEGATIVE

## 2020-01-19 LAB — APTT: aPTT: >160 seconds (ref 24–36)

## 2020-01-19 MED ORDER — ACETAMINOPHEN 650 MG RE SUPP: 650.0000 mg | Freq: Four times a day (QID) | RECTAL | Status: DC | PRN

## 2020-01-19 MED ORDER — ACETAMINOPHEN 325 MG PO TABS
650.0000 mg | ORAL_TABLET | Freq: Four times a day (QID) | ORAL | Status: DC | PRN
  Administered 2020-01-21: 650 mg via ORAL
  Filled 2020-01-19: qty 2

## 2020-01-19 MED ORDER — TRAZODONE HCL 50 MG PO TABS
25.0000 mg | ORAL_TABLET | Freq: Every evening | ORAL | Status: DC | PRN
  Administered 2020-01-19 – 2020-01-22 (×2): 25 mg via ORAL
  Filled 2020-01-19 (×2): qty 1

## 2020-01-19 MED ORDER — MEMANTINE HCL 5 MG PO TABS
5.0000 mg | ORAL_TABLET | Freq: Two times a day (BID) | ORAL | Status: DC
  Administered 2020-01-19 – 2020-01-22 (×6): 5 mg via ORAL
  Filled 2020-01-19 (×7): qty 1

## 2020-01-19 MED ORDER — METOPROLOL SUCCINATE ER 50 MG PO TB24
25.0000 mg | ORAL_TABLET | Freq: Two times a day (BID) | ORAL | Status: DC
  Administered 2020-01-19 – 2020-01-22 (×5): 25 mg via ORAL
  Filled 2020-01-19 (×2): qty 1
  Filled 2020-01-19: qty 0.5
  Filled 2020-01-19: qty 1
  Filled 2020-01-19: qty 0.5

## 2020-01-19 MED ORDER — ASPIRIN EC 81 MG PO TBEC
81.0000 mg | DELAYED_RELEASE_TABLET | Freq: Every day | ORAL | Status: DC
  Administered 2020-01-20 – 2020-01-22 (×3): 81 mg via ORAL
  Filled 2020-01-19 (×3): qty 1

## 2020-01-19 MED ORDER — CALCIUM CARBONATE-VITAMIN D 500-200 MG-UNIT PO TABS
1.0000 | ORAL_TABLET | Freq: Every day | ORAL | Status: DC
  Administered 2020-01-20 – 2020-01-22 (×3): 1 via ORAL
  Filled 2020-01-19 (×3): qty 1

## 2020-01-19 MED ORDER — ONDANSETRON HCL 4 MG/2ML IJ SOLN: 4.0000 mg | Freq: Four times a day (QID) | INTRAMUSCULAR | Status: DC | PRN

## 2020-01-19 MED ORDER — FUROSEMIDE 40 MG PO TABS
20.0000 mg | ORAL_TABLET | Freq: Every day | ORAL | Status: DC
  Administered 2020-01-20 – 2020-01-22 (×3): 20 mg via ORAL
  Filled 2020-01-19 (×3): qty 1

## 2020-01-19 MED ORDER — SODIUM CHLORIDE 0.9 % IV SOLN: INTRAVENOUS | Status: DC

## 2020-01-19 MED ORDER — HEPARIN BOLUS VIA INFUSION
4000.0000 [IU] | Freq: Once | INTRAVENOUS | Status: AC
  Administered 2020-01-19: 4000 [IU] via INTRAVENOUS
  Filled 2020-01-19: qty 4000

## 2020-01-19 MED ORDER — ONDANSETRON HCL 4 MG PO TABS: 4.0000 mg | ORAL_TABLET | Freq: Four times a day (QID) | ORAL | Status: DC | PRN

## 2020-01-19 MED ORDER — MAGNESIUM HYDROXIDE 400 MG/5ML PO SUSP: 30.0000 mL | Freq: Every day | ORAL | Status: DC | PRN

## 2020-01-19 MED ORDER — LEVOTHYROXINE SODIUM 50 MCG PO TABS
75.0000 ug | ORAL_TABLET | Freq: Every day | ORAL | Status: DC
  Administered 2020-01-20 – 2020-01-21 (×2): 75 ug via ORAL
  Filled 2020-01-19: qty 3
  Filled 2020-01-19: qty 2

## 2020-01-19 MED ORDER — HEPARIN (PORCINE) 25000 UT/250ML-% IV SOLN
1000.0000 [IU]/h | INTRAVENOUS | Status: DC
  Administered 2020-01-19: 1050 [IU]/h via INTRAVENOUS
  Administered 2020-01-21: 800 [IU]/h via INTRAVENOUS
  Filled 2020-01-19 (×3): qty 250

## 2020-01-19 MED ORDER — DORZOLAMIDE HCL 2 % OP SOLN
1.0000 [drp] | Freq: Two times a day (BID) | OPHTHALMIC | Status: DC
  Filled 2020-01-19: qty 10

## 2020-01-19 NOTE — H&P (Addendum)
[place]   PATIENT NAME: Deborah NeedsJeraldine Mack    MR#:  [id number]  DATE OF BIRTH:  [birth year]  DATE OF ADMISSION:  01/19/2020  PRIMARY CARE PHYSICIAN: Jamelle HaringHendrickson, Clifford D, MD   REQUESTING/REFERRING PHYSICIAN: Willy Eddyobinson, Patrick, MD  CHIEF COMPLAINT:   Chief Complaint  Patient presents with  . DVT    HISTORY OF PRESENT ILLNESS:  Deborah NeedsJeraldine Weiand  is a 86 y.o. Caucasian female with a known history of hypertension, dementia, glaucoma, osteoporosis and hypothyroidism, who presented to the emergency room with acute onset of left lower extremity pain and swelling.  She is about 1 months status post 2 hip replacement.  She has been on prophylactic Lovenox postoperatively and was later discontinued.  The patient has not been ambulating much lately.  She denies any fever or chills.  No nausea or vomiting or abdominal pain.  No chest pain or dyspnea or cough or wheezing.  No bleeding diathesis.Marland Kitchen.  Upon presentation to the emergency room, vital signs were within normal and later blood pressure was 150/47.  Labs revealed hyponatremia and hypochloremia.  Influenza antigens and COVID-19 PCR came back negative.  Left lower extremity venous Doppler DVT involving the left common femoral vein, saphenofemoral junction, profunda femoris vein, femoral vein and popliteal vein with probable thrombus in the deep calf veins but limited evaluation.  Her left common femoral vein thrombus appeared to be occlusive and iliac venous distention could not be excluded.  The patient was given a bolus of IV heparin followed by a heparin drip.  She will be admitted to a medical bed for further evaluation and management. PAST MEDICAL HISTORY:   Past Medical History:  Diagnosis Date  . Dementia (HCC)   . Glaucoma   . HTN (hypertension) 02/10/2017  . Osteoporosis   . Thyroid disease     PAST SURGICAL HISTORY:   Past Surgical History:  Procedure Laterality Date  . INTRAMEDULLARY (IM) NAIL  INTERTROCHANTERIC Right 12/13/2019   Procedure: INTRAMEDULLARY (IM) NAIL INTERTROCHANTRIC;  Surgeon: Christena FlakePoggi, John J, MD;  Location: [facility] ORS;  Service: Orthopedics;  Laterality: Right;  . NO PAST SURGERIES      SOCIAL HISTORY:   Social History   Tobacco Use  . Smoking status: Never Smoker  . Smokeless tobacco: Never Used  . Tobacco comment: smoking cessation materials not required  Substance Use Topics  . Alcohol use: No    Alcohol/week: 0.0 standard drinks    FAMILY HISTORY:   Family History  Problem Relation Age of Onset  . Cancer Father   . Hyperlipidemia Sister   . Heart disease Brother   . Stroke Neg Hx     DRUG ALLERGIES:  No Known Allergies  REVIEW OF SYSTEMS:   ROS As per history of present illness. All pertinent systems were reviewed above. Constitutional, HEENT, cardiovascular, respiratory, GI, GU, musculoskeletal, neuro, psychiatric, endocrine, integumentary and hematologic systems were reviewed and are otherwise negative/unremarkable except for positive findings mentioned above in the HPI.   MEDICATIONS AT HOME:   Prior to Admission medications   Medication Sig Start Date End Date Taking? Authorizing Provider  acetaminophen (TYLENOL) 325 MG tablet Take 1-2 tablets (325-650 mg total) by mouth every 6 (six) hours as needed for mild pain (pain score 1-3 or temp > 100.5). 12/18/19   Lynn ItoAmery, Sahar, MD  aspirin EC 81 MG tablet Take 81 mg by mouth daily. Swallow whole.    [provider]  bimatoprost (LUMIGAN) 0.01 % SOLN Place 1 drop into both  eyes at bedtime.  Patient not taking: Reported on 12/12/2019    [provider]  brinzolamide (AZOPT) 1 % ophthalmic suspension Place 1 drop into both eyes 2 (two) times daily.  Patient not taking: Reported on 12/12/2019    [provider]  Calcium 600-200 MG-UNIT tablet Take 1 tablet by mouth daily.    [provider]  dorzolamide (TRUSOPT) 2 % ophthalmic solution 1 drop 2 (two) times daily.  10/12/19   [provider]  enoxaparin (LOVENOX) 40 MG/0.4ML injection Inject 0.4 mLs (40 mg total) into the skin daily. Patient not taking: Reported on 01/19/2020 12/15/19   Anson Oregon, PA-C  EUTHYROX 75 MCG tablet TAKE 1 TABLET BY MOUTH ONCE DAILY BEFORE BREAKFAST 06/19/19   Jamelle Haring, MD  furosemide (LASIX) 20 MG tablet Take 20 mg by mouth daily. 08/16/18   [provider]  memantine (NAMENDA) 10 MG tablet Take 1 tablet by mouth twice daily Patient taking differently: Take 5 mg by mouth 2 (two) times daily.  07/23/19   Jamelle Haring, MD  metoprolol succinate (TOPROL-XL) 25 MG 24 hr tablet TAKE 1 TABLET BY MOUTH IN THE MORNING AND 2 NIGHTLY 11/30/19   Jamelle Haring, MD  Multiple Vitamin (MULTIVITAMIN WITH MINERALS) TABS tablet Take 1 tablet by mouth daily. Patient not taking: Reported on 01/19/2020 12/18/19   Lynn Ito, MD  polyethylene glycol (MIRALAX / GLYCOLAX) 17 g packet Take 17 g by mouth daily. Patient not taking: Reported on 01/19/2020 12/18/19   Lynn Ito, MD  rosuvastatin (CRESTOR) 5 MG tablet TAKE 1 TABLET BY MOUTH AT BEDTIME Patient not taking: Reported on 12/12/2019 06/19/19   Jamelle Haring, MD  senna (SENOKOT) 8.6 MG TABS tablet Take 2 tablets (17.2 mg total) by mouth daily. Patient not taking: Reported on 01/19/2020 12/18/19   Lynn Ito, MD  traMADol (ULTRAM) 50 MG tablet Take 1 tablet (50 mg total) by mouth every 6 (six) hours as needed for moderate pain. Patient not taking: Reported on 01/19/2020 12/15/19   Anson Oregon, PA-C  VITAMIN D, ERGOCALCIFEROL, PO Take 2,000 tablets by mouth daily.  Patient not taking: Reported on 12/12/2019    [provider]      VITAL SIGNS:  Blood pressure (!) 150/47, pulse 60, temperature 98.2 F (36.8 C), temperature source Oral, resp. rate 16, SpO2 97 %.  PHYSICAL EXAMINATION:  Physical Exam  GENERAL:  86 y.o.-year-old Caucasian female with new patient lying in  the bed with no acute distress.  EYES: Pupils equal, round, reactive to light and accommodation. No scleral icterus. Extraocular muscles intact.  HEENT: Head atraumatic, normocephalic. Oropharynx and nasopharynx clear.  NECK:  Supple, no jugular venous distention. No thyroid enlargement, no tenderness.  LUNGS: Normal breath sounds bilaterally, no wheezing, rales,rhonchi or crepitation. No use of accessory muscles of respiration.  CARDIOVASCULAR: Regular rate and rhythm, S1, S2 normal. No murmurs, rubs, or gallops.  ABDOMEN: Soft, nondistended, nontender. Bowel sounds present. No organomegaly or mass.  EXTREMITIES: Left leg swelling with cord feeling and positive Homans' sign.  No cyanosis, or clubbing.  NEUROLOGIC: Cranial nerves II through XII are intact. Muscle strength 5/5 in all extremities. Sensation intact. Gait not checked.  PSYCHIATRIC: The patient is alert and oriented x 3.  Normal affect and good eye contact. SKIN: No obvious rash, lesion, or ulcer.   LABORATORY PANEL:   CBC Recent Labs  Lab 01/19/20 1607  WBC 8.2  HGB 11.5*  HCT 35.3*  PLT 305   ------------------------------------------------------------------------------------------------------------------  Chemistries  Recent Labs  Lab 01/19/20 1607  NA 129*  K 3.8  CL 93*  CO2 27  GLUCOSE 120*  BUN 8  CREATININE 0.73  CALCIUM 9.4  AST 16  ALT 9  ALKPHOS 102  BILITOT 1.0   ------------------------------------------------------------------------------------------------------------------  Cardiac Enzymes No results for input(s): TROPONINI in the last 168 hours. ------------------------------------------------------------------------------------------------------------------  RADIOLOGY:  US Venous Img Lower Unilateral Left (DVT)  Result Date: 01/19/2020 CLINICAL DATA:  86 year old with left leg swelling. EXAM: LEFT LOWER EXTREMITY VENOUS DOPPLER ULTRASOUND TECHNIQUE: Gray-scale sonography with graded  compression, as well as color Doppler and duplex ultrasound were performed to evaluate the lower extremity deep venous systems from the level of the common femoral vein and including the common femoral, femoral, profunda femoral, popliteal and calf veins including the posterior tibial, peroneal and gastrocnemius veins when visible. The superficial great saphenous vein was also interrogated. Spectral Doppler was utilized to evaluate flow at rest and with distal augmentation maneuvers in the common femoral, femoral and popliteal veins. COMPARISON:  None. FINDINGS: Contralateral Common Femoral Vein: No evidence of thrombus. Normal compressibility and color Doppler flow. Common Femoral Vein: Positive for thrombus. Left common femoral vein is not compressible and has echogenic thrombus. Thrombus appears to be occlusive. Saphenofemoral Junction: Positive for thrombus. Expansion of saphenofemoral junction related to the thrombus. Thrombus is nonocclusive. Profunda Femoral Vein: Positive for thrombus. Noncompressible with echogenic thrombus. Femoral Vein: Positive for thrombus. Femoral vein is not compressible. No significant flow within the femoral vein. Popliteal Vein: Positive for thrombus. Thrombus in the proximal popliteal vein. There is flow in the distal popliteal vein. Calf Veins: Limited evaluation but there appears to be at least a small amount of thrombus involving the deep calf veins. Other Findings:  None. IMPRESSION: Positive for deep venous thrombosis in the left lower extremity. Thrombus involving the left common femoral vein, saphenofemoral junction, profunda femoral vein, femoral vein and popliteal vein. Probable thrombus in the left deep calf veins but limited evaluation. Left common femoral vein thrombus appears to be occlusive and cannot exclude extension into the iliac venous system. Evaluation for deep venous thrombosis in the IVC and iliac veins could be further characterized with a CT venogram if  needed. These results will be called to the ordering clinician or representative by the Radiologist Assistant, and communication documented in the PACS or [employer]. Electronically Signed   By: Richarda Overlie M.D.   On: 01/19/2020 15:28      IMPRESSION AND PLAN:  1.  Left lower extremity extensive deep venous thrombosis. -The patient will be admitted to a medical bed. -We will continue her on IV heparin. -Dr. Evie Lacks was contacted about the patient and will assess her for possible thrombectomy. -Pain management will be provided.  2.  Hyponatremia. -She will be hydrated with IV normal saline and will follow BMP.  3.  Essential hypertension -We will continue Toprol-XL.  4.  Hypothyroidism. -We will continue Euthyrox and check TSH.  5.  Dyslipidemia. -We will continue statin therapy.  6.  Glaucoma. -We will continue ophthalmic gtt.  7.  DVT prophylaxis. -The patient will be on IV heparin.     All the records are reviewed and case discussed with ED provider. The plan of care was discussed in details with the patient (and family). I answered all questions. The patient agreed to proceed with the above mentioned plan. Further management will depend upon hospital course.   CODE STATUS: DNR/DNI  Status is: Inpatient  Remains inpatient appropriate  because:Ongoing active pain requiring inpatient pain management, Ongoing diagnostic testing needed not appropriate for outpatient work up, Unsafe d/c plan, IV treatments appropriate due to intensity of illness or inability to take PO and Inpatient level of care appropriate due to severity of illness   Dispo: The patient is from: Home              Anticipated d/c is to: Home              Anticipated d/c date is: 2 days              Patient currently is not medically stable to d/c.   TOTAL TIME TAKING CARE OF THIS PATIENT: 55 minutes.    Hannah Beat M.D on 01/19/2020 at 8:55 PM  [facility]   From 7 PM-7 AM, contact  night-coverage [email or web address]  CC: Primary care physician; Jamelle Haring, MD

## 2020-01-19 NOTE — ED Provider Notes (Signed)
[facility] Emergency Department Provider Note    First MD Initiated Contact with Patient 01/19/20 1955     (approximate)  I have reviewed the triage vital signs and the nursing notes.   HISTORY  Chief Complaint DVT    HPI [name] is a 86 y.o. female   below listed past medical history presents to the ER for pain to left lower extremity and swelling. She is roughly 1 month out post hip replacement was on prophylactic Lovenox. States that over the past few days has had decreased mobility due to worsening leg swelling and discomfort. History is somewhat limited due to her to her dementia but the family member at bedside is providing much of the history. She not currently on any anticoagulation. No recent falls. No head injury. No history of GI bleed. No reported history of intolerances to anticoagulation. She had outpatient ultrasound showing evidence of extensive occlusive left lower extremity DVT for which she was sent to the ER.   Past Medical History:  Diagnosis Date  . Dementia (HCC)   . Glaucoma   . HTN (hypertension) 02/10/2017  . Osteoporosis   . Thyroid disease    Family History  Problem Relation Age of Onset  . Cancer Father   . Hyperlipidemia Sister   . Heart disease Brother   . Stroke Neg Hx    Past Surgical History:  Procedure Laterality Date  . INTRAMEDULLARY (IM) NAIL INTERTROCHANTERIC Right 12/13/2019   Procedure: INTRAMEDULLARY (IM) NAIL INTERTROCHANTRIC;  Surgeon: Christena Flake, MD;  Location: [facility] ORS;  Service: Orthopedics;  Laterality: Right;  . NO PAST SURGERIES     Patient Active Problem List   Diagnosis Date Noted  . Closed right hip fracture (HCC) 12/12/2019  . Fall 12/12/2019  . Preoperative clearance 12/12/2019  . Chronic systolic heart failure (HCC) 10/12/2019  . At risk for fall due to comorbid condition 10/12/2019  . Senile purpura (HCC) 08/31/2019  . Dilated cardiomyopathy (HCC) 05/03/2019  .  Osteopenia 06/22/2017  . HTN (hypertension) 02/10/2017  . Dementia of the Alzheimer's type without behavioral disturbance (HCC) 11/04/2015  . Gradual-onset memory impairment 10/25/2015  . Hypothyroidism 05/07/2015  . Hyperlipidemia 05/07/2015  . Hyperglycemia 05/07/2015  . Aortic insufficiency 06/14/2013      Prior to Admission medications   Medication Sig Start Date End Date Taking? Authorizing Provider  acetaminophen (TYLENOL) 325 MG tablet Take 1-2 tablets (325-650 mg total) by mouth every 6 (six) hours as needed for mild pain (pain score 1-3 or temp > 100.5). 12/18/19   Lynn Ito, MD  aspirin EC 81 MG tablet Take 81 mg by mouth daily. Swallow whole.    [provider]  bimatoprost (LUMIGAN) 0.01 % SOLN Place 1 drop into both eyes at bedtime.  Patient not taking: Reported on 12/12/2019    [provider]  brinzolamide (AZOPT) 1 % ophthalmic suspension Place 1 drop into both eyes 2 (two) times daily.  Patient not taking: Reported on 12/12/2019    [provider]  Calcium 600-200 MG-UNIT tablet Take 1 tablet by mouth daily.    [provider]  dorzolamide (TRUSOPT) 2 % ophthalmic solution 1 drop 2 (two) times daily. 10/12/19   [provider]  enoxaparin (LOVENOX) 40 MG/0.4ML injection Inject 0.4 mLs (40 mg total) into the skin daily. Patient not taking: Reported on 01/19/2020 12/15/19   Anson Oregon, PA-C  EUTHYROX 75 MCG tablet TAKE 1 TABLET BY MOUTH ONCE DAILY BEFORE BREAKFAST 06/19/19  Jamelle Haring, MD  furosemide (LASIX) 20 MG tablet Take 20 mg by mouth daily. 08/16/18   [provider]  memantine (NAMENDA) 10 MG tablet Take 1 tablet by mouth twice daily Patient taking differently: Take 5 mg by mouth 2 (two) times daily.  07/23/19   Jamelle Haring, MD  metoprolol succinate (TOPROL-XL) 25 MG 24 hr tablet TAKE 1 TABLET BY MOUTH IN THE MORNING AND 2 NIGHTLY 11/30/19   Jamelle Haring, MD  Multiple Vitamin  (MULTIVITAMIN WITH MINERALS) TABS tablet Take 1 tablet by mouth daily. Patient not taking: Reported on 01/19/2020 12/18/19   Lynn Ito, MD  polyethylene glycol (MIRALAX / GLYCOLAX) 17 g packet Take 17 g by mouth daily. Patient not taking: Reported on 01/19/2020 12/18/19   Lynn Ito, MD  rosuvastatin (CRESTOR) 5 MG tablet TAKE 1 TABLET BY MOUTH AT BEDTIME Patient not taking: Reported on 12/12/2019 06/19/19   Jamelle Haring, MD  senna (SENOKOT) 8.6 MG TABS tablet Take 2 tablets (17.2 mg total) by mouth daily. Patient not taking: Reported on 01/19/2020 12/18/19   Lynn Ito, MD  traMADol (ULTRAM) 50 MG tablet Take 1 tablet (50 mg total) by mouth every 6 (six) hours as needed for moderate pain. Patient not taking: Reported on 01/19/2020 12/15/19   Anson [place], PA-C  VITAMIN D, ERGOCALCIFEROL, PO Take 2,000 tablets by mouth daily.  Patient not taking: Reported on 12/12/2019    [provider]    Allergies Patient has no known allergies.    Social History Social History   Tobacco Use  . Smoking status: Never Smoker  . Smokeless tobacco: Never Used  . Tobacco comment: smoking cessation materials not required  Vaping Use  . Vaping Use: Never used  Substance Use Topics  . Alcohol use: No    Alcohol/week: 0.0 standard drinks  . Drug use: No    Review of Systems Patient denies headaches, rhinorrhea, blurry vision, numbness, shortness of breath, chest pain, edema, cough, abdominal pain, nausea, vomiting, diarrhea, dysuria, fevers, rashes or hallucinations unless otherwise stated above in HPI. ____________________________________________   PHYSICAL EXAM:  VITAL SIGNS: Vitals:   01/19/20 1603  BP: (!) 150/47  Pulse: 60  Resp: 16  Temp: 98.2 F (36.8 C)  SpO2: 97%    Constitutional: Alert, pleasant in NAD.  Eyes: Conjunctivae are normal.  Head: Atraumatic. Nose: No congestion/rhinnorhea. Mouth/Throat: Mucous membranes are moist.   Neck: No stridor.  Painless ROM.  Cardiovascular: Normal rate, regular rhythm. Grossly normal heart sounds.  Good peripheral circulation. Respiratory: Normal respiratory effort.  No retractions. Lungs CTAB. Gastrointestinal: Soft and nontender. No distention. No abdominal bruits. No CVA tenderness. Genitourinary:  Musculoskeletal: 2+ pitting edema to LLE. No cellulitic changes  Neurologic:  Normal speech and language. No gross focal neurologic deficits are appreciated. No facial droop Skin:  Skin is warm, dry and intact. No rash noted. Psychiatric: Mood and affect are normal  ____________________________________________   LABS (all labs ordered are listed, but only abnormal results are displayed)  Results for orders placed or performed during the hospital encounter of 01/19/20 (from the past 24 hour(s))  CBC with Differential     Status: Abnormal   Collection Time: 01/19/20  4:07 PM  Result Value Ref Range   WBC 8.2 4.0 - 10.5 K/uL   RBC 3.72 (L) 3.87 - 5.11 MIL/uL   Hemoglobin 11.5 (L) 12.0 - 15.0 g/dL   HCT 99.3 (L) 36 - 46 %   MCV 94.9 80.0 - 100.0  fL   MCH 30.9 26.0 - 34.0 pg   MCHC 32.6 30.0 - 36.0 g/dL   RDW 37.8 58.8 - 50.2 %   Platelets 305 150 - 400 K/uL   nRBC 0.0 0.0 - 0.2 %   Neutrophils Relative % 65 %   Neutro Abs 5.4 1.7 - 7.7 K/uL   Lymphocytes Relative 19 %   Lymphs Abs 1.5 0.7 - 4.0 K/uL   Monocytes Relative 10 %   Monocytes Absolute 0.8 0.1 - 1.0 K/uL   Eosinophils Relative 4 %   Eosinophils Absolute 0.3 0.0 - 0.5 K/uL   Basophils Relative 1 %   Basophils Absolute 0.1 0.0 - 0.1 K/uL   Immature Granulocytes 1 %   Abs Immature Granulocytes 0.05 0.00 - 0.07 K/uL  Comprehensive metabolic panel     Status: Abnormal   Collection Time: 01/19/20  4:07 PM  Result Value Ref Range   Sodium 129 (L) 135 - 145 mmol/L   Potassium 3.8 3.5 - 5.1 mmol/L   Chloride 93 (L) 98 - 111 mmol/L   CO2 27 22 - 32 mmol/L   Glucose, Bld 120 (H) 70 - 99 mg/dL   BUN 8 8 - 23 mg/dL   Creatinine, Ser  7.74 0.44 - 1.00 mg/dL   Calcium 9.4 8.9 - 12.8 mg/dL   Total Protein 7.3 6.5 - 8.1 g/dL   Albumin 3.4 (L) 3.5 - 5.0 g/dL   AST 16 15 - 41 U/L   ALT 9 0 - 44 U/L   Alkaline Phosphatase 102 38 - 126 U/L   Total Bilirubin 1.0 0.3 - 1.2 mg/dL   GFR, Estimated >78 >67 mL/min   Anion gap 9 5 - 15   ____________________________________________ ____________________________________________  RADIOLOGY  [place] reviewed ____________________________________________   PROCEDURES  Procedure(s) performed:  Procedures    Critical Care performed: no ____________________________________________   INITIAL IMPRESSION / ASSESSMENT AND PLAN / ED COURSE  Pertinent labs & imaging results that were available during my care of the patient were reviewed by me and considered in my medical decision making (see chart for details).   DDX: DVT, CHF, lymphedema, cellulitis  [name] is a 86 y.o. who presents to the ED with presentation with evidence of extensive occlusive DVT causing discomfort and pain. Consulted with Dr. Evie Lacks vascular surgery who does recommend heparinization and admission to the hospitalist for further medical management. Did confirm with Dr. Ernest Pine of orthopedics that should be okay for anticoagulation as she is now several weeks out from her surgery. No contraindications identified to heparinization. Patient and family agreeable to plan.     The patient was evaluated in Emergency Department today for the symptoms described in the history of present illness. He/she was evaluated in the context of the global COVID-19 pandemic, which necessitated consideration that the patient might be at risk for infection with the SARS-CoV-2 virus that causes COVID-19. Institutional protocols and algorithms that pertain to the evaluation of patients at risk for COVID-19 are in a state of rapid change based on information released by regulatory bodies including the CDC and federal and state  organizations. These policies and algorithms were followed during the patient's care in the ED.  As part of my medical decision making, I reviewed the following data within the electronic MEDICAL RECORD NUMBER Nursing notes reviewed and incorporated, Labs reviewed, notes from prior ED visits and [place] Controlled Substance Database   ____________________________________________   FINAL CLINICAL IMPRESSION(S) / ED DIAGNOSES  Final diagnoses:  DVT  of deep femoral vein, left (HCC)      NEW MEDICATIONS STARTED DURING THIS VISIT:  New Prescriptions   No medications on file     Note:  This document was prepared using Dragon voice recognition software and may include unintentional dictation errors.    Willy Eddyobinson, Anetra Czerwinski, MD 01/19/20 2038

## 2020-01-19 NOTE — ED Triage Notes (Signed)
Pt to ED via POV. Pt was sent over from ultrasound department for positive ultrasound of her left leg. Per radiology pt has occlusive DVT. Pt had right hip surgery on 9/29. Pt started having swelling in her right leg on Monday. Pt is not c/o pain. Pt is in NAD.

## 2020-01-19 NOTE — Patient Instructions (Signed)
Please proceed to have the ultrasound done this afternoon.  As we discussed, if this is positive, you will need to be seen in the emergency room setting for further management.  If this is negative, recommend elevating the legs, continuing to take the furosemide product, can wear a compression stocking to help.

## 2020-01-19 NOTE — Progress Notes (Signed)
Patient ID: [name], female    DOB: [birth year], 86 y.o.   MRN: [id number]  PCP: Jamelle Haring, MD  Chief Complaint  Patient presents with  . Leg Swelling    left leg     Subjective:   [name] is a 86 y.o. female, presents to clinic with CC of the following:  Chief Complaint  Patient presents with  . Leg Swelling    left leg     Patient is an 86 year old female  last visit with me was 10/12/2019 with that note reviewed.   At that visit, the family echoed concerns with her being home alone at times due to dementia concerns, and a referral to home health was provided CCM consulted as well.  She was on Namenda at that time as well.  She was admitted to the hospital with a right hip fracture 12/12/2019  Surgery was performed by Dr. Joice Lofts on 12/13/2019. The patient has been at a rehab facility following her surgery. The patient was taking Lovenox daily for DVT prophylaxis after the surgery.  Last follow-up with surgery was 12/25/2019, with the Lovenox recommended to be continued for 1 more week.  And a follow-up plan for 4 weeks. She has been receiving PT as an outpatient, and today the physical therapy noted her left leg was swollen from her knee to her ankle, and was tender. It was recommended she be seen, and a follow-up appointment made presently.  Her daughter was present with her today for her visit  The swelling in the leg was first noted a few days ago, and has been predominantly in the left lower extremity.  It is much more swollen than the right lower extremity.  She has been home since 21 October, and has been doing fairly well at home.  She is more recently using a walker to get up and travel short distances, with assistance.  Still favors the right leg after her right hip surgery, with her predominantly putting weight on her left lower extremity.  She denies any marked pain in the left lower extremity.  Also has not had significant  increased redness or warmth.  She still takes 20 mg daily of Lasix.  She also continues on her thyroid medicine, and her other medicines were reviewed.  She denies any recent chest pains, palpitations, shortness of breath.   She has a significant cardiac history, and she last followed up with cardiology on 12/12/2019 with their assessment and plan as follows:   86 y.o. female with  1. Nonrheumatic aortic valve insufficiency  2. Essential hypertension  3. Dilated cardiomyopathy (CMS-HCC)  4. Late onset Alzheimer's dementia without behavioral disturbance (CMS-HCC)  5. Hyperlipidemia, unspecified hyperlipidemia type   86 year old female with a history of hypertension, hyperlipidemia, Alzheimer's dementia, and moderate aortic insufficiency. Recent 2D echocardiogram reveals moderately reduced left ventricular function of 25-30% with inferior and septal wall akinesis, compared to prior echocardiogram in 2016 which revealed normal LV function without wall motion abnormalities. The patient is currently asymptomatic, denying chest pain, shortness of breath, palpitations, peripheral edema, or syncope. Patient has essential hypertension, systolic blood pressure mildly elevated today on metoprolol. We discussed with the patient's son the option for evaluating her reduced left ventricular function, including nuclear stress testing versus taking a conservative approach and medically managing. We suggested pursuing a conservative approach as the patient is elderly, has dementia, and is asymptomatic.   Plan   1. Continue current medications 2. Counseled patient  about low-sodium diet 3. DASH diet printed instructions given to the patient 4. Counseled patient about low-cholesterol diet  5. Heart healthy diet printed instructions given to the patient 6. Conservative management in light of patient's advanced age and baseline dementia 7. Return to clinic for follow-up in 6 months  Return in about 6 months  (around 06/10/2020).    Her last echocardiogramwas performed on 04/01/2018, which revealed LVEF 25-30% with moderate aortic and mitral regurgitation, mild tricuspid and pulmonic regurgitation, moderate left atrial enlargement, and mild pulmonary hypertension. This LV function was significantly reduced from the previous echocardiogram in 2016, which revealed LVEF greater than 55%.    Dementia:Her history is remarkable forpredominantly short term memory loss She has support from her family,who are with her at home.   She is taking namenda.   Do feel patient is at higher risk for fall due to her comorbid conditions and recent hip fracture noted.     Patient Active Problem List   Diagnosis Date Noted  . Closed right hip fracture (HCC) 12/12/2019  . Fall 12/12/2019  . Preoperative clearance 12/12/2019  . Chronic systolic heart failure (HCC) 10/12/2019  . At risk for fall due to comorbid condition 10/12/2019  . Senile purpura (HCC) 08/31/2019  . Dilated cardiomyopathy (HCC) 05/03/2019  . Osteopenia 06/22/2017  . HTN (hypertension) 02/10/2017  . Dementia of the Alzheimer's type without behavioral disturbance (HCC) 11/04/2015  . Gradual-onset memory impairment 10/25/2015  . Hypothyroidism 05/07/2015  . Hyperlipidemia 05/07/2015  . Hyperglycemia 05/07/2015  . Aortic insufficiency 06/14/2013      Current Outpatient Medications:  .  acetaminophen (TYLENOL) 325 MG tablet, Take 1-2 tablets (325-650 mg total) by mouth every 6 (six) hours as needed for mild pain (pain score 1-3 or temp > 100.5)., Disp: , Rfl:  .  aspirin EC 81 MG tablet, Take 81 mg by mouth daily. Swallow whole., Disp: , Rfl:  .  bimatoprost (LUMIGAN) 0.01 % SOLN, Place 1 drop into both eyes at bedtime.  (Patient not taking: Reported on 12/12/2019), Disp: , Rfl:  .  brinzolamide (AZOPT) 1 % ophthalmic suspension, Place 1 drop into both eyes 2 (two) times daily.  (Patient not taking: Reported on 12/12/2019), Disp: , Rfl:  .   Calcium 600-200 MG-UNIT tablet, Take 1 tablet by mouth daily., Disp: , Rfl:  .  dorzolamide (TRUSOPT) 2 % ophthalmic solution, 1 drop 2 (two) times daily., Disp: , Rfl:  .  enoxaparin (LOVENOX) 40 MG/0.4ML injection, Inject 0.4 mLs (40 mg total) into the skin daily. (Patient not taking: Reported on 01/19/2020), Disp: 5.6 mL, Rfl: 0 .  EUTHYROX 75 MCG tablet, TAKE 1 TABLET BY MOUTH ONCE DAILY BEFORE BREAKFAST, Disp: 90 tablet, Rfl: 3 .  furosemide (LASIX) 20 MG tablet, Take 20 mg by mouth daily., Disp: , Rfl:  .  memantine (NAMENDA) 10 MG tablet, Take 1 tablet by mouth twice daily (Patient taking differently: Take 5 mg by mouth 2 (two) times daily. ), Disp: 180 tablet, Rfl: 1 .  metoprolol succinate (TOPROL-XL) 25 MG 24 hr tablet, TAKE 1 TABLET BY MOUTH IN THE MORNING AND 2 NIGHTLY, Disp: 270 tablet, Rfl: 1 .  Multiple Vitamin (MULTIVITAMIN WITH MINERALS) TABS tablet, Take 1 tablet by mouth daily. (Patient not taking: Reported on 01/19/2020), Disp: , Rfl:  .  polyethylene glycol (MIRALAX / GLYCOLAX) 17 g packet, Take 17 g by mouth daily. (Patient not taking: Reported on 01/19/2020), Disp: 14 each, Rfl: 0 .  rosuvastatin (CRESTOR) 5 MG tablet, TAKE  1 TABLET BY MOUTH AT BEDTIME (Patient not taking: Reported on 12/12/2019), Disp: 90 tablet, Rfl: 3 .  senna (SENOKOT) 8.6 MG TABS tablet, Take 2 tablets (17.2 mg total) by mouth daily. (Patient not taking: Reported on 01/19/2020), Disp: 120 tablet, Rfl: 0 .  traMADol (ULTRAM) 50 MG tablet, Take 1 tablet (50 mg total) by mouth every 6 (six) hours as needed for moderate pain. (Patient not taking: Reported on 01/19/2020), Disp: 30 tablet, Rfl: 0 .  VITAMIN D, ERGOCALCIFEROL, PO, Take 2,000 tablets by mouth daily.  (Patient not taking: Reported on 12/12/2019), Disp: , Rfl:    No Known Allergies   Past Surgical History:  Procedure Laterality Date  . INTRAMEDULLARY (IM) NAIL INTERTROCHANTERIC Right 12/13/2019   Procedure: INTRAMEDULLARY (IM) NAIL INTERTROCHANTRIC;   Surgeon: Christena Flake, MD;  Location: [facility] ORS;  Service: Orthopedics;  Laterality: Right;  . NO PAST SURGERIES       Family History  Problem Relation Age of Onset  . Cancer Father   . Hyperlipidemia Sister   . Heart disease Brother   . Stroke Neg Hx      Social History   Tobacco Use  . Smoking status: Never Smoker  . Smokeless tobacco: Never Used  . Tobacco comment: smoking cessation materials not required  Substance Use Topics  . Alcohol use: No    Alcohol/week: 0.0 standard drinks    With staff assistance, above reviewed with the patient/caregiver today.  ROS: As per HPI, otherwise no specific complaints on a limited and focused system review   No results found for this or any previous visit (from the past 72 hour(s)).   PHQ2/9: Depression screen [facility] 2/9 01/19/2020 08/31/2019 05/05/2019 10/21/2018 08/02/2018  Decreased Interest 0 0 0 0 0  Down, Depressed, Hopeless 0 0 0 0 0  PHQ - 2 Score 0 0 0 0 0  Altered sleeping 1 0 0 - 0  Tired, decreased energy 1 0 0 - 0  Change in appetite 3 0 0 - 0  Feeling bad or failure about yourself  0 0 - - 0  Trouble concentrating 3 0 0 - 0  Moving slowly or fidgety/restless 0 0 0 - 0  Suicidal thoughts 0 0 0 - 0  PHQ-9 Score 8 0 0 - 0  Difficult doing work/chores Not difficult at all Not difficult at all Not difficult at all - Not difficult at all   PHQ-2/9 Result reviewed   Fall Risk: Fall Risk  01/19/2020 10/12/2019 08/31/2019 05/05/2019 10/21/2018  Falls in the past year? 1 0 0 - 0  Number falls in past yr: 0 0 0 0 0  Injury with Fall? 1 0 0 0 0  Risk for fall due to : History of fall(s);Impaired balance/gait;Impaired mobility - - - -  Risk for fall due to: Comment - - - - -  Follow up - - - - Falls prevention discussed      Objective:   Vitals:   01/19/20 1338  BP: 110/68  Pulse: 69  Resp: 16  Temp: 98.1 F (36.7 C)  TempSrc: Oral  SpO2: 90%  Weight: 134 lb (60.8 kg)  Height: 5\' 5"  (1.651 m)    Body mass index is  22.3 kg/m.  Physical Exam    NAD, masked, pleasant, patient in a wheelchair in the office and examined in the wheelchair. HEENT - /AT, sclera anicteric,+ glasses, conj - non-inj'ed, Neck - supple, no JVD,  Car - RRR with1-II/6 systolic murmur present, not  new Pulm- RR and effort normal at rest, CTA without wheeze or rales Abd - soft, NTdiffusely Ext -significant 2-3+ lower extremity edema more diffusely noted from below the knee down to the ankle on the left, no marked erythema, no marked discomfort with palpation diffusely, question subtle tenderness, no cords, no increased warmth, denied marked increased pain with Homans test, pulses difficult to palpate distal, 1+ at most lower extremity edema on the right, Neuro/psychiatric - affect was not flat, appropriate with conversation Alert, appropriate with conversation.  Results for orders placed or performed during the hospital encounter of 12/12/19  Respiratory Panel by RT PCR (Flu A&B, Covid) - Nasopharyngeal Swab   Specimen: Nasopharyngeal Swab  Result Value Ref Range   SARS Coronavirus 2 by RT PCR NEGATIVE NEGATIVE   Influenza A by PCR NEGATIVE NEGATIVE   Influenza B by PCR NEGATIVE NEGATIVE  Surgical PCR screen   Specimen: Nasal Mucosa; Nasal Swab  Result Value Ref Range   MRSA, PCR NEGATIVE NEGATIVE   Staphylococcus aureus NEGATIVE NEGATIVE  Respiratory Panel by RT PCR (Flu A&B, Covid) - Nasopharyngeal Swab   Specimen: Nasopharyngeal Swab  Result Value Ref Range   SARS Coronavirus 2 by RT PCR NEGATIVE NEGATIVE   Influenza A by PCR NEGATIVE NEGATIVE   Influenza B by PCR NEGATIVE NEGATIVE  CBC with Differential  Result Value Ref Range   WBC 9.4 4.0 - 10.5 K/uL   RBC 4.08 3.87 - 5.11 MIL/uL   Hemoglobin 12.1 12.0 - 15.0 g/dL   HCT 69.6 36 - 46 %   MCV 89.7 80.0 - 100.0 fL   MCH 29.7 26.0 - 34.0 pg   MCHC 33.1 30.0 - 36.0 g/dL   RDW 78.9 38.1 - 01.7 %   Platelets 258 150 - 400 K/uL   nRBC 0.0 0.0 - 0.2 %     Neutrophils Relative % 57 %   Neutro Abs 5.5 1.7 - 7.7 K/uL   Lymphocytes Relative 26 %   Lymphs Abs 2.4 0.7 - 4.0 K/uL   Monocytes Relative 10 %   Monocytes Absolute 0.9 0.1 - 1.0 K/uL   Eosinophils Relative 5 %   Eosinophils Absolute 0.5 0.0 - 0.5 K/uL   Basophils Relative 1 %   Basophils Absolute 0.1 0.0 - 0.1 K/uL   Immature Granulocytes 1 %   Abs Immature Granulocytes 0.05 0.00 - 0.07 K/uL  Basic metabolic panel  Result Value Ref Range   Sodium 140 135 - 145 mmol/L   Potassium 2.9 (L) 3.5 - 5.1 mmol/L   Chloride 101 98 - 111 mmol/L   CO2 29 22 - 32 mmol/L   Glucose, Bld 143 (H) 70 - 99 mg/dL   BUN 13 8 - 23 mg/dL   Creatinine, Ser 5.10 0.44 - 1.00 mg/dL   Calcium 8.5 (L) 8.9 - 10.3 mg/dL   GFR calc non Af Amer >60 >60 mL/min   GFR calc Af Amer >60 >60 mL/min   Anion gap 10 5 - 15  CBC  Result Value Ref Range   WBC 10.5 4.0 - 10.5 K/uL   RBC 3.52 (L) 3.87 - 5.11 MIL/uL   Hemoglobin 10.6 (L) 12.0 - 15.0 g/dL   HCT 25.8 (L) 36 - 46 %   MCV 91.5 80.0 - 100.0 fL   MCH 30.1 26.0 - 34.0 pg   MCHC 32.9 30.0 - 36.0 g/dL   RDW 52.7 78.2 - 42.3 %   Platelets 220 150 - 400 K/uL   nRBC 0.0 0.0 -  0.2 %  Basic metabolic panel  Result Value Ref Range   Sodium 140 135 - 145 mmol/L   Potassium 3.6 3.5 - 5.1 mmol/L   Chloride 102 98 - 111 mmol/L   CO2 30 22 - 32 mmol/L   Glucose, Bld 166 (H) 70 - 99 mg/dL   BUN 12 8 - 23 mg/dL   Creatinine, Ser 1.610.66 0.44 - 1.00 mg/dL   Calcium 8.4 (L) 8.9 - 10.3 mg/dL   GFR calc non Af Amer >60 >60 mL/min   GFR calc Af Amer >60 >60 mL/min   Anion gap 8 5 - 15  Protime-INR  Result Value Ref Range   Prothrombin Time 11.7 11.4 - 15.2 seconds   INR 0.9 0.8 - 1.2  Basic metabolic panel  Result Value Ref Range   Sodium 139 135 - 145 mmol/L   Potassium 3.6 3.5 - 5.1 mmol/L   Chloride 103 98 - 111 mmol/L   CO2 29 22 - 32 mmol/L   Glucose, Bld 138 (H) 70 - 99 mg/dL   BUN 14 8 - 23 mg/dL   Creatinine, Ser 0.960.86 0.44 - 1.00 mg/dL   Calcium 8.4  (L) 8.9 - 10.3 mg/dL   GFR calc non Af Amer 60 (L) >60 mL/min   GFR calc Af Amer >60 >60 mL/min   Anion gap 7 5 - 15  CBC  Result Value Ref Range   WBC 10.6 (H) 4.0 - 10.5 K/uL   RBC 2.76 (L) 3.87 - 5.11 MIL/uL   Hemoglobin 8.4 (L) 12.0 - 15.0 g/dL   HCT 04.524.5 (L) 36 - 46 %   MCV 88.8 80.0 - 100.0 fL   MCH 30.4 26.0 - 34.0 pg   MCHC 34.3 30.0 - 36.0 g/dL   RDW 40.913.0 81.111.5 - 91.415.5 %   Platelets 205 150 - 400 K/uL   nRBC 0.0 0.0 - 0.2 %  Basic metabolic panel  Result Value Ref Range   Sodium 136 135 - 145 mmol/L   Potassium 2.8 (L) 3.5 - 5.1 mmol/L   Chloride 102 98 - 111 mmol/L   CO2 29 22 - 32 mmol/L   Glucose, Bld 141 (H) 70 - 99 mg/dL   BUN 14 8 - 23 mg/dL   Creatinine, Ser 7.820.78 0.44 - 1.00 mg/dL   Calcium 8.5 (L) 8.9 - 10.3 mg/dL   GFR calc non Af Amer >60 >60 mL/min   GFR calc Af Amer >60 >60 mL/min   Anion gap 5 5 - 15  CBC  Result Value Ref Range   WBC 10.0 4.0 - 10.5 K/uL   RBC 2.42 (L) 3.87 - 5.11 MIL/uL   Hemoglobin 7.4 (L) 12.0 - 15.0 g/dL   HCT 95.622.5 (L) 36 - 46 %   MCV 93.0 80.0 - 100.0 fL   MCH 30.6 26.0 - 34.0 pg   MCHC 32.9 30.0 - 36.0 g/dL   RDW 21.312.9 08.611.5 - 57.815.5 %   Platelets 186 150 - 400 K/uL   nRBC 0.0 0.0 - 0.2 %  Basic metabolic panel  Result Value Ref Range   Sodium 132 (L) 135 - 145 mmol/L   Potassium 3.7 3.5 - 5.1 mmol/L   Chloride 100 98 - 111 mmol/L   CO2 25 22 - 32 mmol/L   Glucose, Bld 150 (H) 70 - 99 mg/dL   BUN 15 8 - 23 mg/dL   Creatinine, Ser 4.690.77 0.44 - 1.00 mg/dL   Calcium 8.4 (L) 8.9 - 10.3 mg/dL  GFR calc non Af Amer >60 >60 mL/min   GFR calc Af Amer >60 >60 mL/min   Anion gap 7 5 - 15  CBC  Result Value Ref Range   WBC 10.3 4.0 - 10.5 K/uL   RBC 2.08 (L) 3.87 - 5.11 MIL/uL   Hemoglobin 6.2 (L) 12.0 - 15.0 g/dL   HCT 11.9 (L) 36 - 46 %   MCV 90.9 80.0 - 100.0 fL   MCH 29.8 26.0 - 34.0 pg   MCHC 32.8 30.0 - 36.0 g/dL   RDW 14.7 82.9 - 56.2 %   Platelets 190 150 - 400 K/uL   nRBC 0.0 0.0 - 0.2 %  Hemoglobin and  hematocrit, blood  Result Value Ref Range   Hemoglobin 9.4 (L) 12.0 - 15.0 g/dL   HCT 13.0 (L) 36 - 46 %  CBC  Result Value Ref Range   WBC 11.9 (H) 4.0 - 10.5 K/uL   RBC 2.84 (L) 3.87 - 5.11 MIL/uL   Hemoglobin 8.8 (L) 12.0 - 15.0 g/dL   HCT 86.5 (L) 36 - 46 %   MCV 92.3 80.0 - 100.0 fL   MCH 31.0 26.0 - 34.0 pg   MCHC 33.6 30.0 - 36.0 g/dL   RDW 78.4 69.6 - 29.5 %   Platelets 230 150 - 400 K/uL   nRBC 0.0 0.0 - 0.2 %  Basic metabolic panel  Result Value Ref Range   Sodium 134 (L) 135 - 145 mmol/L   Potassium 3.5 3.5 - 5.1 mmol/L   Chloride 99 98 - 111 mmol/L   CO2 28 22 - 32 mmol/L   Glucose, Bld 128 (H) 70 - 99 mg/dL   BUN 12 8 - 23 mg/dL   Creatinine, Ser 2.84 0.44 - 1.00 mg/dL   Calcium 8.6 (L) 8.9 - 10.3 mg/dL   GFR calc non Af Amer >60 >60 mL/min   GFR calc Af Amer >60 >60 mL/min   Anion gap 7 5 - 15  Potassium  Result Value Ref Range   Potassium 3.3 (L) 3.5 - 5.1 mmol/L  Type and screen [facility]  Result Value Ref Range   ABO/RH(D) B POS    Antibody Screen NEG    Sample Expiration      12/19/2019,2359 Performed at [facility], [address], [place], [place] [address]    Unit Number [id number]    Blood Component Type RED CELLS,LR    Unit division 00    Status of Unit REL FROM [facility]    Transfusion Status OK TO TRANSFUSE    Crossmatch Result Compatible    Unit Number [id number]    Blood Component Type RBC LR PHER2    Unit division 00    Status of Unit ISSUED,FINAL    Transfusion Status OK TO TRANSFUSE    Crossmatch Result Compatible   ABO/Rh  Result Value Ref Range   ABO/RH(D)      B POS Performed at [facility], [address] [place], [place] [address]   Prepare RBC (crossmatch)  Result Value Ref Range   Order Confirmation      ORDER PROCESSED BY BLOOD BANK Performed at [facility], [address], [place], [place] [address]   Prepare RBC (crossmatch)  Result Value Ref Range    Order Confirmation      DUPLICATE REQUEST Performed at [facility], [address], [place], [place] [address]   BPAM RBC  Result Value Ref Range   Blood  Product Unit Number [id number]    PRODUCT CODE [id number]    Unit Type and Rh 1700    Blood Product Expiration Date [id number]    ISSUE DATE / TIME [id number]    Blood Product Unit Number [id number]    PRODUCT CODE [id number]    Unit Type and Rh 1700    Blood Product Expiration Date [id number]        Assessment & Plan:    1. Leg swelling - left Concern with the unilateral lower extremity swelling, much more prominent on the left, and given her recent surgery history, do feel getting an ultrasound to rule out a DVT is indicated. This was ordered stat. Did note she is at a fairly high risk if there is a clot, and would have her proceed to the emergency room setting if the ultrasound is positive for DVT. If negative, would recommend elevation, continuing the furosemide product at the present dose, 20 mg daily, and also a compression stocking to help in the short-term. Discussed with her being up and about more in the recent past, and the majority of weight likely on this left lower extremity versus the right, that may be a contributor. Do feel ruling out a DVT is indicated as we discussed.  - VAS [place] LOWER EXTREMITY VENOUS (DVT); Future  2. History of fracture of right hip Noted to increase risk, especially with a unilateral lower extremity swelling. - VAS [place] LOWER EXTREMITY VENOUS (DVT); Future  3. Nonrheumatic aortic valve insufficiency 4. Chronic systolic heart failure (HCC) 5. Essential hypertension Does have significant cardiac history as well as noted. Blood pressure good on check today.  6. Alzheimer's dementia without behavioral disturbance, unspecified timing of dementia onset (HCC) Noted this history, and condition that raises concern for fall risk and potential anticoagulation  7. At risk for  fall due to comorbid condition As above noted.   Await urgent ultrasound ordered presently, and did discuss plans with the patient and the daughter present should that return positive or if it is negative as is hoped.    Jamelle Haring, MD 01/19/20 2:10 PM

## 2020-01-19 NOTE — Progress Notes (Signed)
ANTICOAGULATION CONSULT NOTE - Initial Consult  Pharmacy Consult for Heparin Drip Indication: DVT  No Known Allergies  Patient Measurements:   Heparin Dosing Weight: 60.8 kg  Vital Signs: Temp: 98.2 F (36.8 C) (11/05 1603) Temp Source: Oral (11/05 1603) BP: 150/47 (11/05 1603) Pulse Rate: 60 (11/05 1603)  Labs: Recent Labs    01/19/20 1607  HGB 11.5*  HCT 35.3*  PLT 305  CREATININE 0.73    Estimated Creatinine Clearance: 42.9 mL/min (by C-G formula based on SCr of 0.73 mg/dL).   Medical History: Past Medical History:  Diagnosis Date  . Dementia (HCC)   . Glaucoma   . HTN (hypertension) 02/10/2017  . Osteoporosis   . Thyroid disease     Assessment: Patient is a 86yo female admitted for DVT. Patient is s/p hip replacement 1 month ago, she was discharged on prophylactic Enoxaparin (completed course). Pharmacy consulted for Heparin dosing.  Goal of Therapy:  Heparin level 0.3-0.7 units/ml Monitor platelets by anticoagulation protocol: Yes   Plan:  Give 4000 units bolus x 1 Start heparin infusion at 1050 units/hr Check anti-Xa level in 8 hours and daily while on heparin Continue to monitor H&H and platelets  Clovia Cuff, PharmD, BCPS 01/19/2020 9:15 PM

## 2020-01-20 DIAGNOSIS — G309 Alzheimer's disease, unspecified: Secondary | ICD-10-CM

## 2020-01-20 DIAGNOSIS — F0280 Dementia in other diseases classified elsewhere without behavioral disturbance: Secondary | ICD-10-CM | POA: Diagnosis not present

## 2020-01-20 DIAGNOSIS — I82402 Acute embolism and thrombosis of unspecified deep veins of left lower extremity: Secondary | ICD-10-CM | POA: Diagnosis not present

## 2020-01-20 DIAGNOSIS — L8942 Pressure ulcer of contiguous site of back, buttock and hip, stage 2: Secondary | ICD-10-CM

## 2020-01-20 LAB — HEPARIN LEVEL (UNFRACTIONATED)
Heparin Unfractionated: 0.75 IU/mL — ABNORMAL HIGH (ref 0.30–0.70)
Heparin Unfractionated: 0.75 IU/mL — ABNORMAL HIGH (ref 0.30–0.70)
Heparin Unfractionated: 0.6 IU/mL (ref 0.30–0.70)

## 2020-01-20 LAB — CBC
HCT: 30.3 % — ABNORMAL LOW (ref 36.0–46.0)
Hemoglobin: 10 g/dL — ABNORMAL LOW (ref 12.0–15.0)
MCH: 31.2 pg (ref 26.0–34.0)
MCHC: 33 g/dL (ref 30.0–36.0)
MCV: 94.4 fL (ref 80.0–100.0)
Platelets: 255 10*3/uL (ref 150–400)
RBC: 3.21 MIL/uL — ABNORMAL LOW (ref 3.87–5.11)
RDW: 14.2 % (ref 11.5–15.5)
WBC: 6.9 10*3/uL (ref 4.0–10.5)
nRBC: 0 % (ref 0.0–0.2)

## 2020-01-20 LAB — BASIC METABOLIC PANEL
Anion gap: 8 (ref 5–15)
BUN: 7 mg/dL — ABNORMAL LOW (ref 8–23)
CO2: 26 mmol/L (ref 22–32)
Calcium: 8.7 mg/dL — ABNORMAL LOW (ref 8.9–10.3)
Chloride: 101 mmol/L (ref 98–111)
Creatinine, Ser: 0.51 mg/dL (ref 0.44–1.00)
GFR, Estimated: >60 mL/min (ref >60)
Glucose, Bld: 114 mg/dL — ABNORMAL HIGH (ref 70–99)
Potassium: 3.5 mmol/L (ref 3.5–5.1)
Sodium: 135 mmol/L (ref 135–145)

## 2020-01-20 MED ORDER — HALOPERIDOL LACTATE 5 MG/ML IJ SOLN
5.0000 mg | Freq: Four times a day (QID) | INTRAMUSCULAR | Status: DC | PRN
  Administered 2020-01-20 (×2): 5 mg via INTRAVENOUS
  Filled 2020-01-20 (×2): qty 1

## 2020-01-20 MED ORDER — INFLUENZA VAC A&B SA ADJ QUAD 0.5 ML IM PRSY
0.5000 mL | PREFILLED_SYRINGE | INTRAMUSCULAR | Status: AC
  Administered 2020-01-21: 0.5 mL via INTRAMUSCULAR
  Filled 2020-01-20: qty 0.5

## 2020-01-20 NOTE — Progress Notes (Signed)
ANTICOAGULATION CONSULT NOTE - Initial Consult  Pharmacy Consult for Heparin Drip Indication: DVT  No Known Allergies  Patient Measurements: Weight: 60.8 kg (134 lb) Heparin Dosing Weight: 60.8 kg  Vital Signs: Temp: 98.6 F (37 C) (11/06 0431) Temp Source: Oral (11/06 0431) BP: 136/37 (11/06 0431) Pulse Rate: 63 (11/06 0431)  Labs: Recent Labs    01/19/20 1556 01/19/20 1607 01/20/20 0355  HGB  --  11.5* 10.0*  HCT  --  35.3* 30.3*  PLT  --  305 255  APTT >160*  --   --   LABPROT 13.9  --   --   INR 1.1  --   --   HEPARINUNFRC  --   --  0.75*  CREATININE  --  0.73 0.51    Estimated Creatinine Clearance: 42.9 mL/min (by C-G formula based on SCr of 0.51 mg/dL).   Medical History: Past Medical History:  Diagnosis Date  . Dementia (HCC)   . Glaucoma   . HTN (hypertension) 02/10/2017  . Osteoporosis   . Thyroid disease     Assessment: Patient is a 86yo female admitted for DVT. Patient is s/p hip replacement 1 month ago, she was discharged on prophylactic Enoxaparin (completed course). Pharmacy consulted for Heparin dosing.  Goal of Therapy:  Heparin level 0.3-0.7 units/ml Monitor platelets by anticoagulation protocol: Yes   Plan:  Give 4000 units bolus x 1 Start heparin infusion at 1050 units/hr Check anti-Xa level in 8 hours and daily while on heparin Continue to monitor H&H and platelets   11/6:  HL @ 0355 = 0.75 Will decrease heparin drip 1000 units/hr and recheck HL 8 hrs after rate change.   Angelique Chevalier D  01/20/2020 5:40 AM

## 2020-01-20 NOTE — Progress Notes (Signed)
ANTICOAGULATION CONSULT NOTE - Initial Consult  Pharmacy Consult for Heparin Drip Indication: DVT  No Known Allergies  Patient Measurements: Height: 5\' 5"  (165.1 cm) Weight: 60.8 kg (134 lb) IBW/kg (Calculated) : 57 Heparin Dosing Weight: 60.8 kg  Vital Signs: Temp: 98.3 F (36.8 C) (11/06 1122) Temp Source: Oral (11/06 1122) BP: 144/45 (11/06 1122) Pulse Rate: 62 (11/06 1122)  Labs: Recent Labs    01/19/20 1556 01/19/20 1607 01/20/20 0355 01/20/20 1405  HGB  --  11.5* 10.0*  --   HCT  --  35.3* 30.3*  --   PLT  --  305 255  --   APTT >160*  --   --   --   LABPROT 13.9  --   --   --   INR 1.1  --   --   --   HEPARINUNFRC  --   --  0.75* 0.75*  CREATININE  --  0.73 0.51  --     Estimated Creatinine Clearance: 42.9 mL/min (by C-G formula based on SCr of 0.51 mg/dL).   Medical History: Past Medical History:  Diagnosis Date  . Dementia (HCC)   . Glaucoma   . HTN (hypertension) 02/10/2017  . Osteoporosis   . Thyroid disease     Assessment: Patient is a 86yo female admitted for DVT. Patient is s/p hip replacement 1 month ago, she was discharged on prophylactic Enoxaparin (completed course). Pharmacy consulted for Heparin dosing.  Starting rate 1050 units/hr  11/6: HL @ 0355 = 0.75 - rate decreased to 1000 units/hr  Goal of Therapy:  Heparin level 0.3-0.7 units/ml Monitor platelets by anticoagulation protocol: Yes   Plan:  11/6:  HL @ 1405 = 0.75  Hold heparin 1 hour and decrease heparin drip to 900 units/hr (spoke to nurse)  Will recheck HL 8 hrs after rate change.   13/6, PharmD, BCPS Clinical Pharmacist 01/20/2020 2:36 PM

## 2020-01-20 NOTE — Progress Notes (Addendum)
[facility]  - [place] at [facility]   PATIENT NAME: [name]    MR#:  [id number]  DATE OF BIRTH:  28-Jun-1927  SUBJECTIVE:  patient laying in bed comfortably. She denies any complaints. She has dementia at baseline. Very pleasant. Granddaughter Lurena Joiner in the room  REVIEW OF SYSTEMS:   Review of Systems  Unable to perform ROS: Dementia   Tolerating Diet: Tolerating PT:   DRUG ALLERGIES:  No Known Allergies  VITALS:  Blood pressure (!) 144/45, pulse 62, temperature 98.3 F (36.8 C), temperature source Oral, resp. rate 16, height 5\' 5"  (1.651 m), weight 60.8 kg, SpO2 100 %.  PHYSICAL EXAMINATION:   Physical Exam  GENERAL:  86 y.o.-year-old patient lying in the bed with no acute distress.  HEENT: Head atraumatic, normocephalic. Oropharynx and nasopharynx clear.  LUNGS: Normal breath sounds bilaterally, no wheezing, rales, rhonchi. No use of accessory muscles of respiration.  CARDIOVASCULAR: S1, S2 normal. No murmurs, rubs, or gallops.  ABDOMEN: Soft, nontender, nondistended. Bowel sounds present. No organomegaly or mass.  EXTREMITIES: left lower extremity swelling more than right. No pain on palpation of the calf NEUROLOGIC: moves all extremities well  PSYCHIATRIC:  patient is alert . She has dementia at baseline. Pleasantly confused SKIN:  Pressure Injury 01/19/20 Sacrum Mid Stage 2 -  Partial thickness loss of dermis presenting as a shallow open injury with a red, pink wound bed without slough. (Active)  01/19/20 2334  Location: Sacrum  Location Orientation: Mid  Staging: Stage 2 -  Partial thickness loss of dermis presenting as a shallow open injury with a red, pink wound bed without slough.  Wound Description (Comments):   Present on Admission: Yes        LABORATORY PANEL:  CBC Recent Labs  Lab 01/20/20 0355  WBC 6.9  HGB 10.0*  HCT 30.3*  PLT 255    Chemistries  Recent Labs  Lab 01/19/20 1607 01/19/20 1607 01/20/20 0355   NA 129*   < > 135  K 3.8   < > 3.5  CL 93*   < > 101  CO2 27   < > 26  GLUCOSE 120*   < > 114*  BUN 8   < > 7*  CREATININE 0.73   < > 0.51  CALCIUM 9.4   < > 8.7*  AST 16  --   --   ALT 9  --   --   ALKPHOS 102  --   --   BILITOT 1.0  --   --    < > = values in this interval not displayed.   Cardiac Enzymes No results for input(s): TROPONINI in the last 168 hours. RADIOLOGY:  13/06/21 Venous Img Lower Unilateral Left (DVT)  Result Date: 01/19/2020 CLINICAL DATA:  86 year old with left leg swelling. EXAM: LEFT LOWER EXTREMITY VENOUS DOPPLER ULTRASOUND TECHNIQUE: Gray-scale sonography with graded compression, as well as color Doppler and duplex ultrasound were performed to evaluate the lower extremity deep venous systems from the level of the common femoral vein and including the common femoral, femoral, profunda femoral, popliteal and calf veins including the posterior tibial, peroneal and gastrocnemius veins when visible. The superficial great saphenous vein was also interrogated. Spectral Doppler was utilized to evaluate flow at rest and with distal augmentation maneuvers in the common femoral, femoral and popliteal veins. COMPARISON:  None. FINDINGS: Contralateral Common Femoral Vein: No evidence of thrombus. Normal compressibility and color Doppler flow. Common Femoral Vein: Positive for thrombus. Left common femoral  vein is not compressible and has echogenic thrombus. Thrombus appears to be occlusive. Saphenofemoral Junction: Positive for thrombus. Expansion of saphenofemoral junction related to the thrombus. Thrombus is nonocclusive. Profunda Femoral Vein: Positive for thrombus. Noncompressible with echogenic thrombus. Femoral Vein: Positive for thrombus. Femoral vein is not compressible. No significant flow within the femoral vein. Popliteal Vein: Positive for thrombus. Thrombus in the proximal popliteal vein. There is flow in the distal popliteal vein. Calf Veins: Limited evaluation but there  appears to be at least a small amount of thrombus involving the deep calf veins. Other Findings:  None. IMPRESSION: Positive for deep venous thrombosis in the left lower extremity. Thrombus involving the left common femoral vein, saphenofemoral junction, profunda femoral vein, femoral vein and popliteal vein. Probable thrombus in the left deep calf veins but limited evaluation. Left common femoral vein thrombus appears to be occlusive and cannot exclude extension into the iliac venous system. Evaluation for deep venous thrombosis in the IVC and iliac veins could be further characterized with a CT venogram if needed. These results will be called to the ordering clinician or representative by the Radiologist Assistant, and communication documented in the PACS or [employer]. Electronically Signed   By: Richarda Overlie M.D.   On: 01/19/2020 15:28   ASSESSMENT AND PLAN:  Shannin Naab  is a 86 y.o. Caucasian female with a known history of hypertension, dementia, glaucoma, osteoporosis and hypothyroidism, who presented to the emergency room with acute onset of left lower extremity pain and swelling.  She is about 1 months status post 2 hip replacement.  She has been on prophylactic Lovenox postoperatively and was later discontinued.  The patient has not been ambulating much lately.     Left lower extremity extensive deep venous thrombosis. - continue her on IV heparin. -Dr. Evie Lacks was contacted about the patient and will assess her for possible thrombectomy-- per vascular surgery family at present does not want any procedure. Will have Dr. Wyn Quaker or Schnier revisit Monday if there is any change in plan -Pain management will be provided.  -- Patient recently had hip surgery in September 2021. She did receive two weeks of DVT prophylaxis how were has not been much active.   Hyponatremia. - hydrated with IV normal saline-- improved  Essential hypertension -continue Toprol-XL.   Hypothyroidism. -will  continue Euthyrox    Dyslipidemia. - continue statin therapy.    Glaucoma. -continue eye drops    DVT prophylaxis. -on IV heparin.    Dementia at baseline chronic without behavioral disturbance -supportive care Procedures: Family communication : spoke with Lurena Joiner granddaughter in the room Consults : vascular surgery CODE STATUS: DNR prior to arrival DVT Prophylaxis : IV heparin  Status is: Inpatient  Remains inpatient appropriate because:IV treatments appropriate due to intensity of illness or inability to take PO   Dispo: The patient is from: Home              Anticipated d/c is to: Home              Anticipated d/c date is: 2 days              Patient currently is not medically stable to d/c. patient has left lower extremity extensive DVT. Will continue IV heparin drip over the weekend and revisit and discuss with family if they wish for patient to undergo left lower extremity thrombectomy.  TOC for discharge planning       TOTAL TIME TAKING CARE OF THIS PATIENT: 25 minutes.  >  50% time spent on counselling and coordination of care  Note: This dictation was prepared with Dragon dictation along with smaller phrase technology. Any transcriptional errors that result from this process are unintentional.  Enedina Finner M.D    [facility]   CC: Primary care physician; Jamelle Haring, MDPatient ID: Ewing Schlein, female   DOB: 1927/09/28, 86 y.o.   MRN: [id number]

## 2020-01-20 NOTE — Consult Note (Signed)
[facility] Vascular Consult Note  MRN : [id number]  [name] is a 86 y.o. ([birth year]) female who presents with chief complaint of  Chief Complaint  Patient presents with  . DVT  .  History of Present Illness: Patient with dementia, fall s/p RIGHT hip fracture and intervention last month. Place on Lovenox which was discontinued last week. She developed LEFT leg pain and swelling. Found to have an extensive LEFT leg DVT.  No history of DVT in the past.  Current Facility-Administered Medications  Medication Dose Route Frequency Provider Last Rate Last Admin  . 0.9 %  sodium chloride infusion   Intravenous Continuous Mansy, Jan A, MD 100 mL/hr at 01/19/20 2212 New Bag at 01/19/20 2212  . acetaminophen (TYLENOL) tablet 650 mg  650 mg Oral Q6H PRN Mansy, Jan A, MD       Or  . acetaminophen (TYLENOL) suppository 650 mg  650 mg Rectal Q6H PRN Mansy, Jan A, MD      . aspirin EC tablet 81 mg  81 mg Oral Daily Mansy, Jan A, MD   81 mg at 01/20/20 0932  . calcium-vitamin D (OSCAL WITH D) 500-200 MG-UNIT per tablet 1 tablet  1 tablet Oral Daily Mansy, Jan A, MD   1 tablet at 01/20/20 0932  . dorzolamide (TRUSOPT) 2 % ophthalmic solution 1 drop  1 drop Both Eyes BID Mansy, Jan A, MD      . furosemide (LASIX) tablet 20 mg  20 mg Oral Daily Mansy, Jan A, MD   20 mg at 01/20/20 0931  . haloperidol lactate (HALDOL) injection 5 mg  5 mg Intravenous Q6H PRN Manuela Schwartz, NP   5 mg at 01/20/20 1101  . heparin ADULT infusion 100 units/mL (25000 units/273mL sodium chloride 0.45%)  1,000 Units/hr Intravenous Continuous Mansy, Jan A, MD 10 mL/hr at 01/20/20 0549 1,000 Units/hr at 01/20/20 0549  . [START ON 01/21/2020] influenza vaccine adjuvanted (FLUAD) injection 0.5 mL  0.5 mL Intramuscular Tomorrow-1000 Enedina Finner, MD      . levothyroxine (SYNTHROID) tablet 75 mcg  75 mcg Oral Q0600 Mansy, Jan A, MD   75 mcg at 01/20/20 0933  . magnesium hydroxide (MILK OF MAGNESIA)  suspension 30 mL  30 mL Oral Daily PRN Mansy, Jan A, MD      . memantine [facility]) tablet 5 mg  5 mg Oral BID Mansy, Jan A, MD   5 mg at 01/20/20 0932  . metoprolol succinate (TOPROL-XL) 24 hr tablet 25 mg  25 mg Oral BID Mansy, Jan A, MD   25 mg at 01/20/20 0932  . ondansetron (ZOFRAN) tablet 4 mg  4 mg Oral Q6H PRN Mansy, Jan A, MD       Or  . ondansetron [facility]) injection 4 mg  4 mg Intravenous Q6H PRN Mansy, Jan A, MD      . traZODone (DESYREL) tablet 25 mg  25 mg Oral QHS PRN Mansy, Vernetta Honey, MD   25 mg at 01/19/20 2339    Past Medical History:  Diagnosis Date  . Dementia (HCC)   . Glaucoma   . HTN (hypertension) 02/10/2017  . Osteoporosis   . Thyroid disease     Past Surgical History:  Procedure Laterality Date  . INTRAMEDULLARY (IM) NAIL INTERTROCHANTERIC Right 12/13/2019   Procedure: INTRAMEDULLARY (IM) NAIL INTERTROCHANTRIC;  Surgeon: Christena Flake, MD;  Location: [facility] ORS;  Service: Orthopedics;  Laterality: Right;  . NO PAST SURGERIES  Social History Social History   Tobacco Use  . Smoking status: Never Smoker  . Smokeless tobacco: Never Used  . Tobacco comment: smoking cessation materials not required  Vaping Use  . Vaping Use: Never used  Substance Use Topics  . Alcohol use: No    Alcohol/week: 0.0 standard drinks  . Drug use: No    Family History Family History  Problem Relation Age of Onset  . Cancer Father   . Hyperlipidemia Sister   . Heart disease Brother   . Stroke Neg Hx     No Known Allergies   REVIEW OF SYSTEMS (Negative unless checked)  Constitutional: [] Weight loss  [] Fever  [] Chills Cardiac: [] Chest pain   [] Chest pressure   [] Palpitations   [] Shortness of breath when laying flat   [] Shortness of breath at rest   [] Shortness of breath with exertion. Vascular:  [] Pain in legs with walking   [] Pain in legs at rest   [] Pain in legs when laying flat   [] Claudication   [] Pain in feet when walking  [] Pain in feet at rest  [] Pain in feet when  laying flat   [] History of DVT   [] Phlebitis   [] Swelling in legs   [] Varicose veins   [] Non-healing ulcers Pulmonary:   [] Uses home oxygen   [] Productive cough   [] Hemoptysis   [] Wheeze  [] COPD   [] Asthma Neurologic:  [] Dizziness  [] Blackouts   [] Seizures   [] History of stroke   [] History of TIA  [] Aphasia   [] Temporary blindness   [] Dysphagia   [] Weakness or numbness in arms   [] Weakness or numbness in legs Musculoskeletal:  [] Arthritis   [] Joint swelling   [] Joint pain   [] Low back pain Hematologic:  [] Easy bruising  [] Easy bleeding   [] Hypercoagulable state   [] Anemic  [] Hepatitis Gastrointestinal:  [] Blood in stool   [] Vomiting blood  [] Gastroesophageal reflux/heartburn   [] Difficulty swallowing. Genitourinary:  [] Chronic kidney disease   [] Difficult urination  [] Frequent urination  [] Burning with urination   [] Blood in urine Skin:  [] Rashes   [] Ulcers   [] Wounds Psychological:  [] History of anxiety   []  History of major depression.  Physical Examination  Vitals:   01/20/20 0431 01/20/20 0649 01/20/20 0746 01/20/20 1122  BP: (!) 136/37  (!) 133/44 (!) 144/45  Pulse: 63  63 62  Resp:   15 16  Temp: 98.6 F (37 C)  98.7 F (37.1 C) 98.3 F (36.8 C)  TempSrc: Oral  Oral Oral  SpO2: 97%  98% 100%  Weight:  60.8 kg    Height:  5\' 5"  (1.651 m)     Body mass index is 22.3 kg/m. Gen:  WD/WN, NAD Pulmonary:  Good air movement, respirations not labored, equal bilaterally.  Cardiac: RRR, normal S1, S2. Vascular:  Vessel Right Left  Radial Palpable Palpable  Ulnar    Brachial    Carotid    Aorta    Femoral Palpable Palpable  Popliteal    PT warm warm  DP     Gastrointestinal: soft, non-tender/non-distended. No guarding/reflex.  Musculoskeletal:2+ edema LEFT leg, no erythema, no phlegmasia, Extremities without ischemic changes.  No deformity or atrophy. No edema. Psychiatric:dementiat, Mood & affect appropriate for pt's clinical situation. Dermatologic: No rashes or ulcers  noted.  No cellulitis or open wounds.       CBC Lab Results  Component Value Date   WBC 6.9 01/20/2020   HGB 10.0 (L) 01/20/2020   HCT 30.3 (L) 01/20/2020   MCV 94.4 01/20/2020  PLT 255 01/20/2020    BMET    Component Value Date/Time   NA 135 01/20/2020 0355   NA 143 12/23/2011 1525   K 3.5 01/20/2020 0355   K 4.2 12/23/2011 1525   CL 101 01/20/2020 0355   CL 107 12/23/2011 1525   CO2 26 01/20/2020 0355   CO2 30 12/23/2011 1525   GLUCOSE 114 (H) 01/20/2020 0355   GLUCOSE 129 (H) 12/23/2011 1525   BUN 7 (L) 01/20/2020 0355   BUN 10 12/23/2011 1525   CREATININE 0.51 01/20/2020 0355   CREATININE 0.74 05/05/2019 1408   CALCIUM 8.7 (L) 01/20/2020 0355   CALCIUM 9.5 12/23/2011 1525   GFRNONAA >60 01/20/2020 0355   GFRNONAA 72 05/05/2019 1408   GFRAA >60 12/17/2019 0829   GFRAA 83 05/05/2019 1408   Estimated Creatinine Clearance: 42.9 mL/min (by C-G formula based on SCr of 0.51 mg/dL).  COAG Lab Results  Component Value Date   INR 1.1 01/19/2020   INR 0.9 12/13/2019    Radiology US Venous Img Lower Unilateral Left (DVT)  Result Date: 01/19/2020 CLINICAL DATA:  86 year old with left leg swelling. EXAM: LEFT LOWER EXTREMITY VENOUS DOPPLER ULTRASOUND TECHNIQUE: Gray-scale sonography with graded compression, as well as color Doppler and duplex ultrasound were performed to evaluate the lower extremity deep venous systems from the level of the common femoral vein and including the common femoral, femoral, profunda femoral, popliteal and calf veins including the posterior tibial, peroneal and gastrocnemius veins when visible. The superficial great saphenous vein was also interrogated. Spectral Doppler was utilized to evaluate flow at rest and with distal augmentation maneuvers in the common femoral, femoral and popliteal veins. COMPARISON:  None. FINDINGS: Contralateral Common Femoral Vein: No evidence of thrombus. Normal compressibility and color Doppler flow. Common Femoral  Vein: Positive for thrombus. Left common femoral vein is not compressible and has echogenic thrombus. Thrombus appears to be occlusive. Saphenofemoral Junction: Positive for thrombus. Expansion of saphenofemoral junction related to the thrombus. Thrombus is nonocclusive. Profunda Femoral Vein: Positive for thrombus. Noncompressible with echogenic thrombus. Femoral Vein: Positive for thrombus. Femoral vein is not compressible. No significant flow within the femoral vein. Popliteal Vein: Positive for thrombus. Thrombus in the proximal popliteal vein. There is flow in the distal popliteal vein. Calf Veins: Limited evaluation but there appears to be at least a small amount of thrombus involving the deep calf veins. Other Findings:  None. IMPRESSION: Positive for deep venous thrombosis in the left lower extremity. Thrombus involving the left common femoral vein, saphenofemoral junction, profunda femoral vein, femoral vein and popliteal vein. Probable thrombus in the left deep calf veins but limited evaluation. Left common femoral vein thrombus appears to be occlusive and cannot exclude extension into the iliac venous system. Evaluation for deep venous thrombosis in the IVC and iliac veins could be further characterized with a CT venogram if needed. These results will be called to the ordering clinician or representative by the Radiologist Assistant, and communication documented in the PACS or [employer]. Electronically Signed   By: Richarda OverlieAdam  Henn M.D.   On: 01/19/2020 15:28      Assessment/Plan 1. LEFT lower extremity extensive DVT Continue Heparin gtt for now. If leg continues to improve will consider transition to oral agent early next week. Otherwise will discuss with family re: thrombectomy Currently significant family concern as patients son with whom she resides is critically ill and may be comfort care in the next few days. The granddaughter does not want additional stressors. I explained  that the  patient would likely be adequately covered with anticoagulation alone and thrombectomy would help ameliorate left leg symptoms. Understanding expressed. Will discuss again on Monday.   Bertram Denver, MD  01/20/2020 1:07 PM    This note was created with Dragon medical transcription system.  Any error is purely unintentional

## 2020-01-20 NOTE — Progress Notes (Signed)
ANTICOAGULATION CONSULT NOTE - Initial Consult  Pharmacy Consult for Heparin Drip Indication: DVT  No Known Allergies  Patient Measurements: Height: 5\' 5"  (165.1 cm) Weight: 60.8 kg (134 lb) IBW/kg (Calculated) : 57 Heparin Dosing Weight: 60.8 kg  Vital Signs: Temp: 98.1 F (36.7 C) (11/06 2043) Temp Source: Oral (11/06 2043) BP: 159/56 (11/06 2043) Pulse Rate: 67 (11/06 2043)  Labs: Recent Labs    01/19/20 1556 01/19/20 1607 01/20/20 0355 01/20/20 1405 01/20/20 2131  HGB  --  11.5* 10.0*  --   --   HCT  --  35.3* 30.3*  --   --   PLT  --  305 255  --   --   APTT >160*  --   --   --   --   LABPROT 13.9  --   --   --   --   INR 1.1  --   --   --   --   HEPARINUNFRC  --   --  0.75* 0.75* 0.60  CREATININE  --  0.73 0.51  --   --     Estimated Creatinine Clearance: 42.9 mL/min (by C-G formula based on SCr of 0.51 mg/dL).   Medical History: Past Medical History:  Diagnosis Date  . Dementia (HCC)   . Glaucoma   . HTN (hypertension) 02/10/2017  . Osteoporosis   . Thyroid disease     Assessment: Patient is a 86yo female admitted for DVT. Patient is s/p hip replacement 1 month ago, she was discharged on prophylactic Enoxaparin (completed course). Pharmacy consulted for Heparin dosing.  Starting rate 1050 units/hr  11/6: HL @ 0355 = 0.75 - rate decreased to 1000 units/hr 11/6: HL @ 1405 = 0.75 - rate decreased to 900 units/hr 11/6: HL @ 2131 = 0.60 - therapeutic x 1  Goal of Therapy:  Heparin level 0.3-0.7 units/ml Monitor platelets by anticoagulation protocol: Yes   Plan:  11/6:  HL @ 2131 = 0.60, therapeutic  Continue heparin drip at 900 units/hr  Will recheck HL 8 hrs for confirmation.  Daily CBC while on Heparin drip   2132, PharmD, BCPS 01/20/2020 10:11 PM

## 2020-01-21 DIAGNOSIS — G309 Alzheimer's disease, unspecified: Secondary | ICD-10-CM | POA: Diagnosis not present

## 2020-01-21 DIAGNOSIS — F0280 Dementia in other diseases classified elsewhere without behavioral disturbance: Secondary | ICD-10-CM | POA: Diagnosis not present

## 2020-01-21 DIAGNOSIS — L8942 Pressure ulcer of contiguous site of back, buttock and hip, stage 2: Secondary | ICD-10-CM | POA: Diagnosis not present

## 2020-01-21 DIAGNOSIS — I82402 Acute embolism and thrombosis of unspecified deep veins of left lower extremity: Secondary | ICD-10-CM | POA: Diagnosis not present

## 2020-01-21 LAB — CBC
HCT: 30.6 % — ABNORMAL LOW (ref 36.0–46.0)
Hemoglobin: 10 g/dL — ABNORMAL LOW (ref 12.0–15.0)
MCH: 30.5 pg (ref 26.0–34.0)
MCHC: 32.7 g/dL (ref 30.0–36.0)
MCV: 93.3 fL (ref 80.0–100.0)
Platelets: 264 10*3/uL (ref 150–400)
RBC: 3.28 MIL/uL — ABNORMAL LOW (ref 3.87–5.11)
RDW: 14.2 % (ref 11.5–15.5)
WBC: 6.3 10*3/uL (ref 4.0–10.5)
nRBC: 0 % (ref 0.0–0.2)

## 2020-01-21 LAB — HEPARIN LEVEL (UNFRACTIONATED)
Heparin Unfractionated: 0.71 IU/mL — ABNORMAL HIGH (ref 0.30–0.70)
Heparin Unfractionated: 0.35 IU/mL (ref 0.30–0.70)
Heparin Unfractionated: <0.1 IU/mL — ABNORMAL LOW (ref 0.30–0.70)

## 2020-01-21 NOTE — Progress Notes (Signed)
ANTICOAGULATION CONSULT NOTE - Initial Consult  Pharmacy Consult for Heparin Drip Indication: DVT  No Known Allergies  Patient Measurements: Height: 5\' 5"  (165.1 cm) Weight: 60.8 kg (134 lb) IBW/kg (Calculated) : 57 Heparin Dosing Weight: 60.8 kg  Vital Signs: Temp: 98.7 F (37.1 C) (11/07 0327) Temp Source: Oral (11/07 0327) BP: 137/47 (11/07 0327) Pulse Rate: 65 (11/07 0327)  Labs: Recent Labs    01/19/20 1556 01/19/20 1607 01/19/20 1607 01/20/20 0355 01/20/20 0355 01/20/20 1405 01/20/20 2131 01/21/20 0549  HGB  --  11.5*   < > 10.0*  --   --   --  10.0*  HCT  --  35.3*  --  30.3*  --   --   --  30.6*  PLT  --  305  --  255  --   --   --  264  APTT >160*  --   --   --   --   --   --   --   LABPROT 13.9  --   --   --   --   --   --   --   INR 1.1  --   --   --   --   --   --   --   HEPARINUNFRC  --   --   --  0.75*   < > 0.75* 0.60 0.71*  CREATININE  --  0.73  --  0.51  --   --   --   --    < > = values in this interval not displayed.    Estimated Creatinine Clearance: 42.9 mL/min (by C-G formula based on SCr of 0.51 mg/dL).   Medical History: Past Medical History:  Diagnosis Date  . Dementia (HCC)   . Glaucoma   . HTN (hypertension) 02/10/2017  . Osteoporosis   . Thyroid disease     Assessment: Patient is a 86yo female admitted for DVT. Patient is s/p hip replacement 1 month ago, she was discharged on prophylactic Enoxaparin (completed course). Pharmacy consulted for Heparin dosing.  Starting rate 1050 units/hr  11/6: HL @ 0355 = 0.75 - rate decreased to 1000 units/hr 11/6: HL @ 1405 = 0.75 - rate decreased to 900 units/hr 11/6: HL @ 2131 = 0.60 - therapeutic x 1 11/7: HL @ 0549 = 0.71   Goal of Therapy:  Heparin level 0.3-0.7 units/ml Monitor platelets by anticoagulation protocol: Yes   Plan:  11/7: HL @ 0549 = 0.71 Will decrease heparin drip to 800 units/hr and recheck HL 8 hrs after rate change.   Tiearra Colwell D 01/21/2020 6:26 AM

## 2020-01-21 NOTE — Plan of Care (Signed)
LLE swelling has decreased since yesterday. Denies pain. Problem: Education: Goal: Knowledge of General Education information will improve Description: Including pain rating scale, medication(s)/side effects and non-pharmacologic comfort measures 01/21/2020 0734 by Garwin Brothers, RN Outcome: Progressing 01/21/2020 0533 by Garwin Brothers, RN Outcome: Progressing   Problem: Health Behavior/Discharge Planning: Goal: Ability to manage health-related needs will improve 01/21/2020 0734 by Garwin Brothers, RN Outcome: Progressing 01/21/2020 0533 by Garwin Brothers, RN Outcome: Progressing   Problem: Clinical Measurements: Goal: Ability to maintain clinical measurements within normal limits will improve 01/21/2020 0734 by Garwin Brothers, RN Outcome: Progressing 01/21/2020 0533 by Garwin Brothers, RN Outcome: Progressing Goal: Will remain free from infection 01/21/2020 0734 by Garwin Brothers, RN Outcome: Progressing 01/21/2020 0533 by Garwin Brothers, RN Outcome: Progressing Goal: Diagnostic test results will improve 01/21/2020 0734 by Garwin Brothers, RN Outcome: Progressing 01/21/2020 0533 by Garwin Brothers, RN Outcome: Progressing Goal: Respiratory complications will improve 01/21/2020 0734 by Garwin Brothers, RN Outcome: Progressing 01/21/2020 0533 by Garwin Brothers, RN Outcome: Progressing Goal: Cardiovascular complication will be avoided 01/21/2020 0734 by Garwin Brothers, RN Outcome: Progressing 01/21/2020 0533 by Garwin Brothers, RN Outcome: Progressing   Problem: Activity: Goal: Risk for activity intolerance will decrease 01/21/2020 0734 by Garwin Brothers, RN Outcome: Progressing 01/21/2020 0533 by Garwin Brothers, RN Outcome: Progressing   Problem: Nutrition: Goal: Adequate nutrition will be maintained 01/21/2020 0734 by Garwin Brothers, RN Outcome: Progressing 01/21/2020 0533 by Garwin Brothers, RN Outcome: Progressing   Problem: Coping: Goal: Level of anxiety will  decrease 01/21/2020 0734 by Garwin Brothers, RN Outcome: Progressing 01/21/2020 0533 by Garwin Brothers, RN Outcome: Progressing   Problem: Elimination: Goal: Will not experience complications related to bowel motility 01/21/2020 0734 by Garwin Brothers, RN Outcome: Progressing 01/21/2020 0533 by Garwin Brothers, RN Outcome: Progressing Goal: Will not experience complications related to urinary retention 01/21/2020 0734 by Garwin Brothers, RN Outcome: Progressing 01/21/2020 0533 by Garwin Brothers, RN Outcome: Progressing   Problem: Pain Managment: Goal: General experience of comfort will improve 01/21/2020 0734 by Garwin Brothers, RN Outcome: Progressing 01/21/2020 0533 by Garwin Brothers, RN Outcome: Progressing   Problem: Safety: Goal: Ability to remain free from injury will improve 01/21/2020 0734 by Garwin Brothers, RN Outcome: Progressing 01/21/2020 0533 by Garwin Brothers, RN Outcome: Progressing   Problem: Skin Integrity: Goal: Risk for impaired skin integrity will decrease 01/21/2020 0734 by Garwin Brothers, RN Outcome: Progressing 01/21/2020 0533 by Garwin Brothers, RN Outcome: Progressing

## 2020-01-21 NOTE — Progress Notes (Signed)
°   01/21/20 2034 01/21/20 2118  Assess: MEWS Score  Temp 97.6 F (36.4 C) 98 F (36.7 C)  BP (!) 107/48 129/75  Pulse Rate (!) 118 (!) 127  Resp 17 20  Level of Consciousness Alert  --   SpO2 97 % 96 %  O2 Device Room [employer]

## 2020-01-21 NOTE — Progress Notes (Signed)
PT Cancellation Note  Patient Details Name: [name] MRN: [id number] DOB: 20-Aug-1927   Cancelled Treatment:    Reason Eval/Treat Not Completed: Other (comment). Consult received and chart reviewed. Pt currently pending thrombectomy tomorrow for L LE DVT. Discussed with RN. Will hold at this time and evaluate s/p Sx if medically stable.   Willie Plain 01/21/2020, 11:10 AM  Elizabeth Palau, PT, DPT [phone]

## 2020-01-21 NOTE — Progress Notes (Signed)
Subjective/Chief Complaint: Much better today. Per Daughter and Granddaughter without complaint. Denies leg pain.  Objective: Vital signs in last 24 hours: Temp:  [97.1 F (36.2 C)-98.7 F (37.1 C)] 98.3 F (36.8 C) (11/07 1153) Pulse Rate:  [57-67] 64 (11/07 1153) Resp:  [16-18] 18 (11/07 1153) BP: (125-165)/(42-56) 165/50 (11/07 1153) SpO2:  [94 %-99 %] 98 % (11/07 1153) Last BM Date: 01/18/20  Intake/Output from previous day: 11/06 0701 - 11/07 0700 In: 231.2 [I.V.:231.2] Out: 1300 [Urine:1300] Intake/Output this shift: Total I/O In: -  Out: 950 [Urine:950]  General appearance: alert and no distress Extremities: Significant improvement in LEFT leg swelling, minimal edema, soft, nontender  Lab Results:  Recent Labs    01/20/20 0355 01/21/20 0549  WBC 6.9 6.3  HGB 10.0* 10.0*  HCT 30.3* 30.6*  PLT 255 264   BMET Recent Labs    01/19/20 1607 01/20/20 0355  NA 129* 135  K 3.8 3.5  CL 93* 101  CO2 27 26  GLUCOSE 120* 114*  BUN 8 7*  CREATININE 0.73 0.51  CALCIUM 9.4 8.7*   PT/INR Recent Labs    01/19/20 1556  LABPROT 13.9  INR 1.1   ABG No results for input(s): PHART, HCO3 in the last 72 hours.  Invalid input(s): PCO2, PO2  Studies/Results: US Venous Img Lower Unilateral Left (DVT)  Result Date: 01/19/2020 CLINICAL DATA:  86 year old with left leg swelling. EXAM: LEFT LOWER EXTREMITY VENOUS DOPPLER ULTRASOUND TECHNIQUE: Gray-scale sonography with graded compression, as well as color Doppler and duplex ultrasound were performed to evaluate the lower extremity deep venous systems from the level of the common femoral vein and including the common femoral, femoral, profunda femoral, popliteal and calf veins including the posterior tibial, peroneal and gastrocnemius veins when visible. The superficial great saphenous vein was also interrogated. Spectral Doppler was utilized to evaluate flow at rest and with distal augmentation maneuvers in the common  femoral, femoral and popliteal veins. COMPARISON:  None. FINDINGS: Contralateral Common Femoral Vein: No evidence of thrombus. Normal compressibility and color Doppler flow. Common Femoral Vein: Positive for thrombus. Left common femoral vein is not compressible and has echogenic thrombus. Thrombus appears to be occlusive. Saphenofemoral Junction: Positive for thrombus. Expansion of saphenofemoral junction related to the thrombus. Thrombus is nonocclusive. Profunda Femoral Vein: Positive for thrombus. Noncompressible with echogenic thrombus. Femoral Vein: Positive for thrombus. Femoral vein is not compressible. No significant flow within the femoral vein. Popliteal Vein: Positive for thrombus. Thrombus in the proximal popliteal vein. There is flow in the distal popliteal vein. Calf Veins: Limited evaluation but there appears to be at least a small amount of thrombus involving the deep calf veins. Other Findings:  None. IMPRESSION: Positive for deep venous thrombosis in the left lower extremity. Thrombus involving the left common femoral vein, saphenofemoral junction, profunda femoral vein, femoral vein and popliteal vein. Probable thrombus in the left deep calf veins but limited evaluation. Left common femoral vein thrombus appears to be occlusive and cannot exclude extension into the iliac venous system. Evaluation for deep venous thrombosis in the IVC and iliac veins could be further characterized with a CT venogram if needed. These results will be called to the ordering clinician or representative by the Radiologist Assistant, and communication documented in the PACS or [employer]. Electronically Signed   By: Richarda Overlie M.D.   On: 01/19/2020 15:28    Anti-infectives: Anti-infectives (From admission, onward)   None      Assessment/Plan: s/p Procedure(s): PERIPHERAL VASCULAR THROMBECTOMY (  Left) Significant improvement in left leg. Extensive discussion with family regarding plan for thrombectomy  tomorrow- Risks, benefits and alternatives explained.Factors to consider including patient age.Option of continuing only anticoagulation. After further discussion they wish to see if she continues to have improvement by tomorrow. If so will transition to oral agent. Otherwise will tentatively plan for thrombectomy.  LOS: 2 days    Bertram Denver 01/21/2020

## 2020-01-21 NOTE — Progress Notes (Signed)
[facility]  - [place] [facility]   PATIENT NAME: [name]    MR#:  [id number]  DATE OF BIRTH:  May 06, 1927  SUBJECTIVE:  patient laying in bed comfortably. She denies any complaints. She has dementia at baseline. Very pleasant. Granddaughter Lurena Joiner and dter Kathie Rhodes in the room  Left Leg swelling much improved REVIEW OF SYSTEMS:   Review of Systems  Unable to perform ROS: Dementia   Tolerating Diet:yes Tolerating PT: pending  DRUG ALLERGIES:  No Known Allergies  VITALS:  Blood pressure (!) 165/50, pulse 64, temperature 98.3 F (36.8 C), temperature source Oral, resp. rate 18, height 5\' 5"  (1.651 m), weight 60.8 kg, SpO2 98 %.  PHYSICAL EXAMINATION:   Physical Exam  GENERAL:  86 y.o.-year-old patient lying in the bed with no acute distress.  HEENT: Head atraumatic, normocephalic. Oropharynx and nasopharynx clear.  LUNGS: Normal breath sounds bilaterally, no wheezing, rales, rhonchi. No use of accessory muscles of respiration.  CARDIOVASCULAR: S1, S2 normal. No murmurs, rubs, or gallops.  ABDOMEN: Soft, nontender, nondistended. Bowel sounds present. No organomegaly or mass.  EXTREMITIES: left lower extremity swelling more than right--improving No pain on palpation of the calf NEUROLOGIC: moves all extremities well  PSYCHIATRIC:  patient is alert . She has dementia at baseline. Pleasantly confused SKIN:  Pressure Injury 01/19/20 Sacrum Mid Stage 2 -  Partial thickness loss of dermis presenting as a shallow open injury with a red, pink wound bed without slough. (Active)  01/19/20 2334  Location: Sacrum  Location Orientation: Mid  Staging: Stage 2 -  Partial thickness loss of dermis presenting as a shallow open injury with a red, pink wound bed without slough.  Wound Description (Comments):   Present on Admission: Yes        LABORATORY PANEL:  CBC Recent Labs  Lab 01/21/20 0549  WBC 6.3  HGB 10.0*  HCT 30.6*  PLT 264    Chemistries   Recent Labs  Lab 01/19/20 1607 01/19/20 1607 01/20/20 0355  NA 129*   < > 135  K 3.8   < > 3.5  CL 93*   < > 101  CO2 27   < > 26  GLUCOSE 120*   < > 114*  BUN 8   < > 7*  CREATININE 0.73   < > 0.51  CALCIUM 9.4   < > 8.7*  AST 16  --   --   ALT 9  --   --   ALKPHOS 102  --   --   BILITOT 1.0  --   --    < > = values in this interval not displayed.   Cardiac Enzymes No results for input(s): TROPONINI in the last 168 hours. RADIOLOGY:  13/06/21 Venous Img Lower Unilateral Left (DVT)  Result Date: 01/19/2020 CLINICAL DATA:  86 year old with left leg swelling. EXAM: LEFT LOWER EXTREMITY VENOUS DOPPLER ULTRASOUND TECHNIQUE: Gray-scale sonography with graded compression, as well as color Doppler and duplex ultrasound were performed to evaluate the lower extremity deep venous systems from the level of the common femoral vein and including the common femoral, femoral, profunda femoral, popliteal and calf veins including the posterior tibial, peroneal and gastrocnemius veins when visible. The superficial great saphenous vein was also interrogated. Spectral Doppler was utilized to evaluate flow at rest and with distal augmentation maneuvers in the common femoral, femoral and popliteal veins. COMPARISON:  None. FINDINGS: Contralateral Common Femoral Vein: No evidence of thrombus. Normal compressibility and color Doppler flow. Common  Femoral Vein: Positive for thrombus. Left common femoral vein is not compressible and has echogenic thrombus. Thrombus appears to be occlusive. Saphenofemoral Junction: Positive for thrombus. Expansion of saphenofemoral junction related to the thrombus. Thrombus is nonocclusive. Profunda Femoral Vein: Positive for thrombus. Noncompressible with echogenic thrombus. Femoral Vein: Positive for thrombus. Femoral vein is not compressible. No significant flow within the femoral vein. Popliteal Vein: Positive for thrombus. Thrombus in the proximal popliteal vein. There is flow in the  distal popliteal vein. Calf Veins: Limited evaluation but there appears to be at least a small amount of thrombus involving the deep calf veins. Other Findings:  None. IMPRESSION: Positive for deep venous thrombosis in the left lower extremity. Thrombus involving the left common femoral vein, saphenofemoral junction, profunda femoral vein, femoral vein and popliteal vein. Probable thrombus in the left deep calf veins but limited evaluation. Left common femoral vein thrombus appears to be occlusive and cannot exclude extension into the iliac venous system. Evaluation for deep venous thrombosis in the IVC and iliac veins could be further characterized with a CT venogram if needed. These results will be called to the ordering clinician or representative by the Radiologist Assistant, and communication documented in the PACS or [employer]. Electronically Signed   By: Richarda Overlie M.D.   On: 01/19/2020 15:28   ASSESSMENT AND PLAN:  [name]  is a 86 y.o. Caucasian female with a known history of hypertension, dementia, glaucoma, osteoporosis and hypothyroidism, who presented to the emergency room with acute onset of left lower extremity pain and swelling.  She is about 1 months status post 2 hip replacement.  She has been on prophylactic Lovenox postoperatively and was later discontinued.  The patient has not been ambulating much lately.     Left lower extremity extensive deep venous thrombosis. -continue her on IV heparin. -Vascular surgery consultation with Dr. Evie Lacks appreciated -- patient's daughter and granddaughter has decided for patient to undergo left LE thrombectomy tomorrow. Will keep her NPO after midnight -- Patient recently had hip surgery in September 2021. She did receive two weeks of DVT prophylaxis    Hyponatremia. - hydrated with IV normal saline-- improved  Essential hypertension -continue Toprol-XL.   Hypothyroidism. -on  Euthyrox    Dyslipidemia. - continue statin  therapy.    Glaucoma. -continue eye drops    DVT prophylaxis. -on IV heparin.    Dementia at baseline chronic without behavioral disturbance -supportive care Procedures: Family communication : spoke with Lurena Joiner granddaughter and Kathie Rhodes daughter in the room Consults : vascular surgery CODE STATUS: DNR prior to arrival DVT Prophylaxis : IV heparin  Status is: Inpatient  Remains inpatient appropriate because:IV treatments appropriate due to intensity of illness or inability to take PO   Dispo: The patient is from: Home              Anticipated d/c is to: Home              Anticipated d/c date is: 2 days              Patient currently is not medically stable to d/c. patient has left lower extremity extensive DVT. Will continue IV heparin drip over the weekend and revisit and discussed with family and they wish for patient to undergo left lower extremity thrombectomy--likley tomorrow  TOC for discharge planning       TOTAL TIME TAKING CARE OF THIS PATIENT: 25 minutes.  >50% time spent on counselling and coordination of care  Note: This  dictation was prepared with Dragon dictation along with smaller phrase technology. Any transcriptional errors that result from this process are unintentional.  Enedina Finner M.D    [facility]   CC: Primary care physician; Jamelle Haring, MDPatient ID: Ewing Schlein, female   DOB: 22-May-1927, 86 y.o.   MRN: [id number]

## 2020-01-21 NOTE — Progress Notes (Signed)
ANTICOAGULATION CONSULT NOTE  Pharmacy Consult for Heparin Drip Indication: DVT  No Known Allergies  Patient Measurements: Height: 5\' 5"  (165.1 cm) Weight: 60.8 kg (134 lb) IBW/kg (Calculated) : 57 Heparin Dosing Weight: 60.8 kg  Vital Signs: Temp: 98.3 F (36.8 C) (11/07 1153) Temp Source: Oral (11/07 1153) BP: 165/50 (11/07 1153) Pulse Rate: 64 (11/07 1153)  Labs: Recent Labs    01/19/20 1556 01/19/20 1607 01/19/20 1607 01/20/20 0355 01/20/20 1405 01/20/20 2131 01/21/20 0549 01/21/20 1437  HGB  --  11.5*   < > 10.0*  --   --  10.0*  --   HCT  --  35.3*  --  30.3*  --   --  30.6*  --   PLT  --  305  --  255  --   --  264  --   APTT >160*  --   --   --   --   --   --   --   LABPROT 13.9  --   --   --   --   --   --   --   INR 1.1  --   --   --   --   --   --   --   HEPARINUNFRC  --   --   --  0.75*   < > 0.60 0.71* 0.35  CREATININE  --  0.73  --  0.51  --   --   --   --    < > = values in this interval not displayed.    Estimated Creatinine Clearance: 42.9 mL/min (by C-G formula based on SCr of 0.51 mg/dL).   Medical History: Past Medical History:  Diagnosis Date  . Dementia (HCC)   . Glaucoma   . HTN (hypertension) 02/10/2017  . Osteoporosis   . Thyroid disease     Assessment: Patient is a 86yo female admitted for DVT. Patient is s/p hip replacement 1 month ago, she was discharged on prophylactic Enoxaparin (completed course). Pharmacy consulted for Heparin dosing.  Starting rate 1050 units/hr  11/6: HL @ 0355 = 0.75 - rate decreased to 1000 units/hr 11/6: HL @ 1405 = 0.75 - rate decreased to 900 units/hr 11/6: HL @ 2131 = 0.60 - therapeutic x 1 11/7: HL @ 0549 = 0.71 - rate decreased to 800 units/hr 11/7: HL @ 1437 = 0.35  Goal of Therapy:  Heparin level 0.3-0.7 units/ml Monitor platelets by anticoagulation protocol: Yes   Plan:  Heparin level therapeutic x 1. Continue heparin infusion at 800 units/hr. Recheck HL at 2300 to confirm. CBC daily  while on heparin drip.  13/7, PharmD 01/21/2020 3:15 PM

## 2020-01-21 NOTE — Plan of Care (Signed)

## 2020-01-22 ENCOUNTER — Telehealth: Payer: Self-pay

## 2020-01-22 DIAGNOSIS — I82412 Acute embolism and thrombosis of left femoral vein: Principal | ICD-10-CM

## 2020-01-22 DIAGNOSIS — L8942 Pressure ulcer of contiguous site of back, buttock and hip, stage 2: Secondary | ICD-10-CM | POA: Diagnosis not present

## 2020-01-22 DIAGNOSIS — G309 Alzheimer's disease, unspecified: Secondary | ICD-10-CM | POA: Diagnosis not present

## 2020-01-22 DIAGNOSIS — F0280 Dementia in other diseases classified elsewhere without behavioral disturbance: Secondary | ICD-10-CM | POA: Diagnosis not present

## 2020-01-22 LAB — CBC
HCT: 32.7 % — ABNORMAL LOW (ref 36.0–46.0)
Hemoglobin: 10.5 g/dL — ABNORMAL LOW (ref 12.0–15.0)
MCH: 30.8 pg (ref 26.0–34.0)
MCHC: 32.1 g/dL (ref 30.0–36.0)
MCV: 95.9 fL (ref 80.0–100.0)
Platelets: 285 10*3/uL (ref 150–400)
RBC: 3.41 MIL/uL — ABNORMAL LOW (ref 3.87–5.11)
RDW: 14 % (ref 11.5–15.5)
WBC: 7 10*3/uL (ref 4.0–10.5)
nRBC: 0 % (ref 0.0–0.2)

## 2020-01-22 LAB — HEPARIN LEVEL (UNFRACTIONATED): Heparin Unfractionated: 0.4 IU/mL (ref 0.30–0.70)

## 2020-01-22 SURGERY — PERIPHERAL VASCULAR THROMBECTOMY
Anesthesia: Moderate Sedation | Laterality: Left

## 2020-01-22 MED ORDER — APIXABAN 5 MG PO TABS
10.0000 mg | ORAL_TABLET | Freq: Two times a day (BID) | ORAL | 3 refills | Status: DC
Start: 2020-01-22 — End: 2020-07-29

## 2020-01-22 MED ORDER — APIXABAN 5 MG PO TABS: 5.0000 mg | ORAL_TABLET | Freq: Two times a day (BID) | ORAL | Status: DC

## 2020-01-22 MED ORDER — APIXABAN 5 MG PO TABS
10.0000 mg | ORAL_TABLET | Freq: Two times a day (BID) | ORAL | Status: DC
  Administered 2020-01-22: 10 mg via ORAL
  Filled 2020-01-22: qty 2

## 2020-01-22 MED ORDER — HEPARIN BOLUS VIA INFUSION
1800.0000 [IU] | Freq: Once | INTRAVENOUS | Status: AC
  Administered 2020-01-22: 1800 [IU] via INTRAVENOUS
  Filled 2020-01-22: qty 1800

## 2020-01-22 NOTE — TOC Initial Note (Signed)
Transition of Care [facility]) - Initial/Assessment Note    Patient Details  Name: [name] MRN: [id number] Date of Birth: Apr 13, 1927  [facility] CM/SW Contact:    Shelbie Ammons, RN Phone Number: 01/22/2020, 2:16 PM  Clinical Narrative:   RNCM met with patient and family in room. Patient sitting up in recliner but does not interact with this CM. Patient reports that they are very pleased with home health services patient is receiving through [facility]. She currently has Therapist, sports, PT, OT. Eliquis card was provided to patient's family since she will be discharging on this medication. No other needs identified at this time.            Expected Discharge Plan: [place] Barriers to Discharge: No Barriers Identified   Patient Goals and CMS Choice        Expected Discharge Plan and Services Expected Discharge Plan: [place] Choice: [place] arrangements for the past 2 months: Single Family Home Expected Discharge Date: 01/22/20                         HH Arranged: RN, PT, OT HH Agency: [facility]        Prior Living Arrangements/Services Living arrangements for the past 2 months: Single Family Home Lives with:: Adult Children Patient language and need for interpreter reviewed:: Yes Do you feel safe going back to the place where you live?: Yes      Need for Family Participation in Patient Care: Yes (Comment) Care giver support system in place?: Yes (comment)   Criminal Activity/Legal Involvement Pertinent to Current Situation/Hospitalization: No - Comment as needed  Activities of Daily Living Home Assistive Devices/Equipment: Walker (specify type) ADL Screening (condition at time of admission) Patient's cognitive ability adequate to safely complete daily activities?: No Is the patient deaf or have difficulty hearing?: Yes Does the patient have difficulty seeing, even when wearing  glasses/contacts?: No Does the patient have difficulty concentrating, remembering, or making decisions?: Yes Patient able to express need for assistance with ADLs?: No Does the patient have difficulty dressing or bathing?: Yes Independently performs ADLs?: No Communication: Needs assistance Is this a change from baseline?: Pre-admission baseline Dressing (OT): Needs assistance Is this a change from baseline?: Pre-admission baseline Grooming: Needs assistance Is this a change from baseline?: Pre-admission baseline Feeding: Needs assistance Is this a change from baseline?: Pre-admission baseline Bathing: Needs assistance Is this a change from baseline?: Pre-admission baseline Toileting: Needs assistance Is this a change from baseline?: Pre-admission baseline In/Out Bed: Needs assistance Is this a change from baseline?: Pre-admission baseline Walks in Home: Needs assistance Is this a change from baseline?: Pre-admission baseline Does the patient have difficulty walking or climbing stairs?: Yes Weakness of Legs: Both Weakness of Arms/Hands: None  Permission Sought/Granted                  Emotional Assessment Appearance:: Appears stated age       Alcohol / Substance Use: Not Applicable Psych Involvement: No (comment)  Admission diagnosis:  Left leg DVT ([place]) [I82.402] DVT of deep femoral vein, left (HCC) [I82.412] Patient Active Problem List   Diagnosis Date Noted  . DVT of deep femoral vein, left ([place])   . Left leg DVT ([place]) 01/19/2020  . Pressure injury of skin 01/19/2020  . Closed right hip fracture ([place]) 12/12/2019  . Fall 12/12/2019  .  Preoperative clearance 12/12/2019  . Chronic systolic heart failure ([place]) 10/12/2019  . At risk for fall due to comorbid condition 10/12/2019  . Senile purpura ([place]) 08/31/2019  . Dilated cardiomyopathy ([place]) 05/03/2019  . Osteopenia 06/22/2017  . HTN (hypertension) 02/10/2017  . Dementia of the Alzheimer's type without  behavioral disturbance ([place]) 11/04/2015  . Gradual-onset memory impairment 10/25/2015  . Hypothyroidism 05/07/2015  . Hyperlipidemia 05/07/2015  . Hyperglycemia 05/07/2015  . Aortic insufficiency 06/14/2013   PCP:  Towanda Malkin, MD Pharmacy:   [facility] [address], [place] [address] [place] [address] Phone: [phone] Fax: [phone]     Social Determinants of Health (SDOH) Interventions    Readmission Risk Interventions No flowsheet data found.

## 2020-01-22 NOTE — Progress Notes (Signed)
ANTICOAGULATION CONSULT NOTE - Initial Consult  Pharmacy Consult for Apixaban Indication: DVT  No Known Allergies  Patient Measurements: Height: 5\' 5"  (165.1 cm) Weight: 60.8 kg (134 lb) IBW/kg (Calculated) : 57 Heparin Dosing Weight:    Vital Signs: Temp: 98.4 F (36.9 C) (11/08 0948) Temp Source: Oral (11/08 0948) BP: 145/90 (11/08 0948) Pulse Rate: 58 (11/08 0948)  Labs: Recent Labs    01/19/20 1556 01/19/20 1607 01/19/20 1607 01/20/20 0355 01/20/20 1405 01/21/20 0549 01/21/20 0549 01/21/20 1437 01/21/20 2306 01/22/20 0433 01/22/20 0831  HGB  --  11.5*   < > 10.0*  --  10.0*  --   --   --  10.5*  --   HCT  --  35.3*   < > 30.3*  --  30.6*  --   --   --  32.7*  --   PLT  --  305   < > 255  --  264  --   --   --  285  --   APTT >160*  --   --   --   --   --   --   --   --   --   --   LABPROT 13.9  --   --   --   --   --   --   --   --   --   --   INR 1.1  --   --   --   --   --   --   --   --   --   --   HEPARINUNFRC  --   --   --  0.75*   < > 0.71*   < > 0.35 <0.10*  --  0.40  CREATININE  --  0.73  --  0.51  --   --   --   --   --   --   --    < > = values in this interval not displayed.    Estimated Creatinine Clearance: 42.9 mL/min (by C-G formula based on SCr of 0.51 mg/dL).   Medical History: Past Medical History:  Diagnosis Date  . Dementia (HCC)   . Glaucoma   . HTN (hypertension) 02/10/2017  . Osteoporosis   . Thyroid disease     Medications:  Scheduled:  . apixaban  10 mg Oral BID   Followed by  . [START ON 01/29/2020] apixaban  5 mg Oral BID  . aspirin EC  81 mg Oral Daily  . calcium-vitamin D  1 tablet Oral Daily  . dorzolamide  1 drop Both Eyes BID  . furosemide  20 mg Oral Daily  . levothyroxine  75 mcg Oral Q0600  . memantine  5 mg Oral BID  . metoprolol succinate  25 mg Oral BID   Infusions:    Assessment: Patient is a 86yo female admitted for DVT. Patient is s/p hip replacement 1 month ago, she was discharged on prophylactic  Enoxaparin (completed course). On Heparin drip now transitioning to apixaban for DVT treatment.   Goal of Therapy:  Monitor platelets by anticoagulation protocol: Yes   Plan:  Discontinue Heparin drip and start apixaban 10 mg po BID x 7 days then 5 mg po BID for DVT treatment dose.  Kiran Carline A 01/22/2020,9:51 AM

## 2020-01-22 NOTE — Evaluation (Signed)
Physical Therapy Evaluation Patient Details Name: [name] MRN: [id number] DOB: 1928-01-29 Today's Date: 01/22/2020   History of Present Illness  Pt is an 86 y.o. female presenting to hospital 11/5 with L LE swelling; OP US showing evidence of extensive occlusive L LE DVT; pt also noted with hyponatremia.  Of note pt s/p R hip IMN intertrochantric 9/29 (pt went to [facility] and discharged to home 10/21).  PMH includes dementia, htn, glaucoma, chronic systolic HF, osteopenia.  Clinical Impression  Prior to hospital admission, pt was ambulating shorter household distances with RW and assist; lives with family (pt's granddaughter reports her mom quit her job about 1 week ago and is able to provide 24/7 assist).  Currently pt is SBA semi-supine to sitting edge of bed; min assist to stand 1st trial standing up to RW with posterior lean noted and pt's R knee buckling when attempting to take a step so pt assisted back into sitting; 2nd trial standing pt min assist to stand and able to take steps bed to recliner with RW and CGA; then pt able to ambulate 40 feet with RW min assist x1 (CGA provided by 2nd for safety but not required) and chair follow provided.  Pt's R knee mildly buckling at times (min assist to correct)--pt's granddaughter reports pt with h/o this.  Gait impairments noted (see below for details).  Pt would benefit from skilled PT to address noted impairments and functional limitations (see below for any additional details).  Pt's granddaughter reports wanting pt to discharge home and pt will have 24/7 assist and feels comfortable with amount of physical assist pt requires for functional mobility and has needed DME at home.  Upon hospital discharge, pt would benefit from HHPT and 24/7 assist; pt already has transport chair and ramp to enter home.    Follow Up Recommendations Home health PT;Supervision/Assistance - 24 hour    Equipment Recommendations   (pt has needed DME at home already)     Recommendations for Other Services       Precautions / Restrictions Precautions Precautions: Fall Restrictions Weight Bearing Restrictions: Yes RLE Weight Bearing: Weight bearing as tolerated      Mobility  Bed Mobility Overal bed mobility: Needs Assistance Bed Mobility: Supine to Sit     Supine to sit: Supervision;HOB elevated     General bed mobility comments: increased effort/time to perform on own with vc's to use bedrail    Transfers Overall transfer level: Needs assistance Equipment used: Rolling walker (2 wheeled) Transfers: Sit to/from [employer] Sit to Stand: Min assist Stand pivot transfers: Min guard (stand step turn bed to recliner with RW)       General transfer comment: x2 trials from bed; x1 trial from recliner; vc's to scoot forward (towards edge of sitting surface) prior to standing and UE/LE placement; min assist to initiate stand up to RW and control descent sitting down  Ambulation/Gait Ambulation/Gait assistance: Min guard;Min assist;+2 safety/equipment (chair follow) Gait Distance (Feet): 40 Feet Assistive device: Rolling walker (2 wheeled)   Gait velocity: decreased   General Gait Details: decreased stance time R LE; R knee mildly buckling at times (min assist to correct); vc's for upright posture and staying closer to RW; vc's to increase BOS; pt with decreased B LE step length/foot clearance and became more of a shuffling gait with fatigue/increased distance ambulating  Stairs            Wheelchair Mobility    Modified Rankin (Stroke Patients Only)  Balance Overall balance assessment: Needs assistance Sitting-balance support: No upper extremity supported;Feet supported Sitting balance-Leahy Scale: Good Sitting balance - Comments: steady sitting reaching within BOS   Standing balance support: Single extremity supported Standing balance-Leahy Scale: Fair Standing balance comment: pt requiring at least  single UE support for static standing balance                             Pertinent Vitals/Pain Pain Assessment: Faces Faces Pain Scale: No hurt Pain Intervention(s): Limited activity within patient's tolerance;Monitored during session;Repositioned  Vitals (HR and O2 on room air) stable and WFL throughout treatment session.    Home Living Family/patient expects to be discharged to:: Private residence Living Arrangements: Children (pt's daughter and granddaughter) Available Help at Discharge: Family;Available 24 hours/day Type of Home: House Home Access: Ramped entrance     Home Layout: One level Home Equipment: Walker - 2 wheels;Transport chair;Hospital bed;Bedside commode;Shower seat      Prior Function Level of Independence: Needs assistance   Gait / Transfers Assistance Needed: Since discharge home from hospital pt ambulating shorter household distances with assist and RW use (initially using transport chair to follow for safety but progressed away from this); intermittent physical assist required for transfers (more so when pt had been sitting for a while)  ADL's / Homemaking Assistance Needed: Takes sponge baths; pt has been using briefs for toileting since she came home from [facility].  Comments: H/o R knee buckling; has motion detector in room to assist with notifying family when pt is getting up     Hand Dominance        Extremity/Trunk Assessment   Upper Extremity Assessment Upper Extremity Assessment: Generalized weakness    Lower Extremity Assessment Lower Extremity Assessment: RLE deficits/detail;LLE deficits/detail RLE Deficits / Details: hip flexion at least 3-/5; knee flexion/extension at least 3/5; DF/PF at least 3/5 LLE Deficits / Details: at least 3/5 AROM hip flexion, knee flexion/extension, and DF/PF    Cervical / Trunk Assessment Cervical / Trunk Assessment: Normal  Communication   Communication: No difficulties  Cognition  Arousal/Alertness: Awake/alert Behavior During Therapy: WFL for tasks assessed/performed Overall Cognitive Status: History of cognitive impairments - at baseline                                 General Comments: Oriented to person      General Comments  Pt and pt's granddaughter agreeable to PT session.    Exercises     Assessment/Plan    PT Assessment Patient needs continued PT services  PT Problem List Decreased strength;Decreased activity tolerance;Decreased balance;Decreased mobility;Decreased knowledge of use of DME       PT Treatment Interventions DME instruction;Gait training;Functional mobility training;Therapeutic activities;Therapeutic exercise;Balance training;Patient/family education    PT Goals (Current goals can be found in the Care Plan section)  Acute Rehab PT Goals Patient Stated Goal: to have pt go home PT Goal Formulation: With patient/family Time For Goal Achievement: 02/05/20 Potential to Achieve Goals: Good    Frequency Min 2X/week   Barriers to discharge        Co-evaluation               AM-PAC PT "6 Clicks" Mobility  Outcome Measure Help needed turning from your back to your side while in a flat bed without using bedrails?: A Little Help needed moving from lying on your back to  sitting on the side of a flat bed without using bedrails?: A Little Help needed moving to and from a bed to a chair (including a wheelchair)?: A Little Help needed standing up from a chair using your arms (e.g., wheelchair or bedside chair)?: A Little Help needed to walk in hospital room?: A Little Help needed climbing 3-5 steps with a railing? : Total 6 Click Score: 16    End of Session Equipment Utilized During Treatment: Gait belt Activity Tolerance: Patient limited by fatigue Patient left: in chair;with call bell/phone within reach;with chair alarm set;with family/visitor present;Other (comment) (B LE's elevated via pillow with heels  floating) Nurse Communication: Mobility status;Precautions;Other (comment) (purewick removed and pt may need brief change d/t incontinence) PT Visit Diagnosis: Other abnormalities of gait and mobility (R26.89);Muscle weakness (generalized) (M62.81);History of falling (Z91.81);Difficulty in walking, not elsewhere classified (R26.2)    Time: 9509-3267 PT Time Calculation (min) (ACUTE ONLY): 43 min   Charges:   PT Evaluation $PT Eval Low Complexity: 1 Low PT Treatments $Gait Training: 8-22 mins $Therapeutic Activity: 8-22 mins      Hendricks Limes, PT 01/22/20, 12:50 PM

## 2020-01-22 NOTE — Discharge Instructions (Addendum)
Venous Thromboembolism Prevention Venous thromboembolism (VTE) is a condition in which a blood clot (thrombus) develops in the body. A deep vein thrombosis (DVT) is a blood clot that usually occurs in a deep vein in the leg or the pelvis, but it can also occur in the arm. Sometimes, pieces of a thrombus can break off and travel through the bloodstream to other parts of the body. When that happens, the thrombus is called an embolus. An embolus that travels to the lungs is called a pulmonary embolism. An embolism can block the blood flow in the blood vessels of other organs as well. How can this condition affect me? VTE is a serious health condition that can cause disability or death. It is very important to get help right away. Do not ignore your symptoms. What can increase my risk? You are more likely to develop this condition if you:  Have had recent major surgery or trauma.  Have certain health conditions, such as cancer.  Are in the hospital for a sudden illness.  Have not moved for a long period of time, such as if you are on bed rest or traveling a long distance.  Take certain medicines, such as birth control pills or hormone replacement therapy.  Are pregnant or recently gave birth.  Have a personal or family history of VTE.  Are overweight.  Are 35 years of age or older.  Use products that contain nicotine and tobacco. What actions can I take to prevent this? In the hospital A VTE may be prevented by taking medicines that are prescribed to prevent blood clots (anticoagulants). You can also help to prevent VTE while in the hospital by taking these actions:  Get out of bed and walk. Ask your health care provider if this is safe for you to do.  Ask your health care provider if you should use a sequential compression device (SCD). This is a machine that pumps air into compression sleeves that are wrapped around your legs.  Ask your health care provider if you should wear  tight, elastic stockings that apply pressure to the lower legs (compression stockings). Compression stockings are sometimes used with SCDs. At home after surgery Understand that you have an increased risk for VTE for the first 4-6 weeks after surgery. During this time:  Avoid traveling for more than 4 hours. If you must travel after surgery, ask your health care provider about additional preventive actions that you can take.  Avoid sitting or lying still for too long. If possible, get up and walk around one time every hour. Ask your health care provider when this is safe for you to do. While traveling Travel that takes more than 4 hours can increase the risk of a VTE. To prevent VTE when traveling:  Exercise your arms and legs every hour. You can do this by standing, stretching, and bending and straightening your arms and legs. If you are traveling by airplane, train, or bus, walk up and down the aisle as often as possible to get your blood moving. If you are traveling by car, stop and get out of the car every hour to exercise your arms and legs and stretch. Other types of exercise might include: ? Keeping your feet flat on the ground and raising your toes. ? Switching from tightening the muscles in your calves and thighs to relaxing those same muscles while you are sitting. ? Pointing and flexing your feet at the ankle joints while you are sitting.  Drink  enough water to keep your urine pale yellow.  Avoid drinking alcohol during long travel. Generally, it is not recommended that you take medicines to prevent DVT during routine travel. Activity   Stay active. Avoid sitting or lying in bed for long periods of time without moving your arms and legs.  Exercise by moving your arms and legs for 30 minutes or more every day.  Try not to bump or injure your legs.  Avoid crossing your legs when you are sitting. Lifestyle  Do not use any products that contain nicotine or tobacco, such as  cigarettes, e-cigarettes, and chewing tobacco. If you need help quitting, ask your health care provider. This is especially important if you take estrogen medicines.  Avoid wearing tight clothing around your legs or waist. General information   Wear compression stockings as told by your health care provider. These stockings help to prevent blood clots and reduce swelling in your legs. Do not let them bunch up when you are wearing them.  Avoid using medicines that contain estrogen if you do not need them. These include birth control pills and hormone replacement therapy.  Do not use pillows under your knees while lying down unless told by your health care provider.  Maintain a healthy weight. Where to find more information  Centers for Disease Control and Prevention: [employer]Ex[employer].com.br  American Heart Association: www.heart.org Get help right away if:  You have chest pain or shortness of breath.  You cough up blood.  You have a rapid or irregular heartbeat.  You feel light-headed or dizzy.  You have new or increased pain, swelling, or redness in an arm or leg.  You have numbness or tingling in an arm or leg.  You have blood in your vomit, stool, or urine. These symptoms may represent a serious problem that is an emergency. Do not wait to see if the symptoms will go away. Get medical help right away. Call your local emergency services (911 in the [place] Do not drive yourself to the hospital. Summary  Venous thromboembolism (VTE) is a condition in which a blood clot (thrombus)develops in the body. A deep vein thrombosis (DVT) is a blood clot that usually occurs in a deep vein in the leg or the pelvis, but it can also occur in the arm.  VTE is a serious health condition that can cause disability or death. It is very important to get help right away. Do not ignore your symptoms.  If you are traveling, exercise your arms and legs every hour.  Stay active to help prevent blood clots.  Avoid sitting or lying in bed for long periods of time without moving your arms and legs. This information is not intended to replace advice given to you by your health care provider. Make sure you discuss any questions you have with your health care provider. Document Revised: 06/21/2018 Document Reviewed: 05/10/2018 Elsevier Patient Education  2020 [employer].   Keep leg elevated while in bed

## 2020-01-22 NOTE — Telephone Encounter (Signed)
Transition Care Management Unsuccessful Follow-up Telephone Call  Date of discharge and from where:  01/22/20 [facility]  Attempts:  1st Attempt  Reason for unsuccessful TCM follow-up call:  Left voice message to reach me at [phone]

## 2020-01-22 NOTE — Progress Notes (Signed)
Pt discharged home with daughter Kathie Rhodes and granddaughter Lurena Joiner. Nurse explained discharge instructions and family gave understanding. IV taken out of [facility]. Wheeled down to medical mall entrance and helped into car by nurse.

## 2020-01-22 NOTE — Care Management Important Message (Signed)
Important Message  Patient Details  Name: [name] MRN: [id number] Date of Birth: 07/23/1927   Medicare Important Message Given:  N/A - LOS <3 / Initial given by admissions     [name] No 01/22/2020, 7:41 AM

## 2020-01-22 NOTE — Discharge Summary (Signed)
[facility] - [place] at [facility]   PATIENT NAME: [name]    MR#:  [id number]  DATE OF BIRTH:  Dec 08, 1927  DATE OF ADMISSION:  01/19/2020 ADMITTING PHYSICIAN: Hannah Beat, MD  DATE OF DISCHARGE: 01/22/2020  PRIMARY CARE PHYSICIAN: Jamelle Haring, MD    ADMISSION DIAGNOSIS:  Left leg DVT (HCC) [I82.402] DVT of deep femoral vein, left (HCC) [I82.412]  DISCHARGE DIAGNOSIS:  Left LE extensive DVT  SECONDARY DIAGNOSIS:   Past Medical History:  Diagnosis Date  . Dementia (HCC)   . Glaucoma   . HTN (hypertension) 02/10/2017  . Osteoporosis   . Thyroid disease     HOSPITAL COURSE:   JeraldineGriffinis a89 y.o.Caucasian femalewith a known history of hypertension, dementia, glaucoma, osteoporosis and hypothyroidism, who presented to the emergency room with acute onset of left lower extremity pain and swelling. She is about 1 months status post 2 hip replacement. She has been on prophylactic Lovenoxpostoperatively and was later discontinued. The patient has not been ambulating much lately.    Left lower extremity extensive deep venous thrombosis. -continueheron IV heparin--now chang to po eliquis -Vascular surgery consultation with Dr. Evie Lacks appreciated -- patient's daughter and granddaughter has changed their mind for patient to undergo left LE thrombectomy--procedure cancelled --leg swelling much better -- Patient recently had hip surgery in September 2021. She did receive two weeks of DVT prophylaxis   Hyponatremia. - hydrated with IV normal saline-- improved  Essential hypertension -continue Toprol-XL.  Hypothyroidism. -on  Euthyrox  Dyslipidemia. - continue statin therapy.   Glaucoma. -continue eye drops   DVT prophylaxis. -on IV heparin.   Dementia at baseline chronic without behavioral disturbance -supportive care Procedures:none Family communication : spoke with Lurena Joiner granddaughter and  Kathie Rhodes daughter in the room Consults : vascular surgery CODE STATUS: DNR prior to arrival DVT Prophylaxis : po eliquis  Status is: Inpatient    Dispo: The patient is from: Home  Anticipated d/c is to: Home  Anticipated d/c date is: today  Patient currently is medically stable to d/c. D/c home  TOC for discharge planning--Home with HHPT/RN/OT  D/w dter and granddter and agrees with plan  CONSULTS OBTAINED:  Treatment Team:  Bertram Denver, MD  DRUG ALLERGIES:  No Known Allergies  DISCHARGE MEDICATIONS:   Allergies as of 01/22/2020   No Known Allergies     Medication List    STOP taking these medications   aspirin EC 81 MG tablet   bimatoprost 0.01 % Soln Commonly known as: LUMIGAN   brinzolamide 1 % ophthalmic suspension Commonly known as: AZOPT   dorzolamide 2 % ophthalmic solution Commonly known as: TRUSOPT   enoxaparin 40 MG/0.4ML injection Commonly known as: LOVENOX   multivitamin with minerals Tabs tablet   polyethylene glycol 17 g packet Commonly known as: MIRALAX / GLYCOLAX   rosuvastatin 5 MG tablet Commonly known as: CRESTOR   senna 8.6 MG Tabs tablet Commonly known as: SENOKOT   traMADol 50 MG tablet Commonly known as: ULTRAM   VITAMIN D (ERGOCALCIFEROL) PO     TAKE these medications   acetaminophen 325 MG tablet Commonly known as: TYLENOL Take 1-2 tablets (325-650 mg total) by mouth every 6 (six) hours as needed for mild pain (pain score 1-3 or temp > 100.5).   apixaban 5 MG Tabs tablet Commonly known as: ELIQUIS Take 2 tablets (10 mg total) by mouth 2 (two) times daily. And then from 01/29/2020 take 1 tab (5 mg ) two times daily  Calcium 600-200 MG-UNIT tablet Take 1 tablet by mouth daily.   Euthyrox 75 MCG tablet Generic drug: levothyroxine TAKE 1 TABLET BY MOUTH ONCE DAILY BEFORE BREAKFAST   furosemide 20 MG tablet Commonly known as: LASIX Take 20 mg by mouth daily.   memantine  10 MG tablet Commonly known as: NAMENDA Take 1 tablet by mouth twice daily What changed: how much to take   metoprolol succinate 25 MG 24 hr tablet Commonly known as: TOPROL-XL TAKE 1 TABLET BY MOUTH IN THE MORNING AND 2 NIGHTLY            Discharge Care Instructions  (From admission, onward)         Start     Ordered   01/22/20 0000  Discharge wound care:       Comments: Apply foam padding   01/22/20 1209          If you experience worsening of your admission symptoms, develop shortness of breath, life threatening emergency, suicidal or homicidal thoughts you must seek medical attention immediately by calling 911 or calling your MD immediately  if symptoms less severe.  You Must read complete instructions/literature along with all the possible adverse reactions/side effects for all the Medicines you take and that have been prescribed to you. Take any new Medicines after you have completely understood and accept all the possible adverse reactions/side effects.   Please note  You were cared for by a hospitalist during your hospital stay. If you have any questions about your discharge medications or the care you received while you were in the hospital after you are discharged, you can call the unit and asked to speak with the hospitalist on call if the hospitalist that took care of you is not available. Once you are discharged, your primary care physician will handle any further medical issues. Please note that NO REFILLS for any discharge medications will be authorized once you are discharged, as it is imperative that you return to your primary care physician (or establish a relationship with a primary care physician if you do not have one) for your aftercare needs so that they can reassess your need for medications and monitor your lab values. Today   SUBJECTIVE  No new issues per RN Family at bedside Left leg swelling shows improvement   VITAL SIGNS:  Blood pressure (!)  145/90, pulse (!) 58, temperature 98.4 F (36.9 C), temperature source Oral, resp. rate 16, height 5\' 5"  (1.651 m), weight 60.8 kg, SpO2 95 %.  I/O:    Intake/Output Summary (Last 24 hours) at 01/22/2020 1209 Last data filed at 01/22/2020 0100 Gross per 24 hour  Intake 280.22 ml  Output 950 ml  Net -669.78 ml    PHYSICAL EXAMINATION:  GENERAL:  86 y.o.-year-old patient lying in the bed with no acute distress.  LUNGS: Normal breath sounds bilaterally, no wheezing, rales,rhonchi or crepitation. No use of accessory muscles of respiration.  CARDIOVASCULAR: S1, S2 normal. No murmurs, rubs, or gallops.  ABDOMEN: Soft, non-tender, non-distended. Bowel sounds present. No organomegaly or mass.  EXTREMITIES: No pedal edema, cyanosis, or clubbing. Left leg swelling improved. Some swelling + over the ankle NEUROLOGIC: grossly non-focal PSYCHIATRIC: The patient is alert and oriented x 3.  SKIN:  Pressure Injury 01/19/20 Sacrum Mid Stage 2 -  Partial thickness loss of dermis presenting as a shallow open injury with a red, pink wound bed without slough. (Active)  01/19/20 2334  Location: Sacrum  Location Orientation: Mid  Staging: Stage 2 -  Partial thickness loss of dermis presenting as a shallow open injury with a red, pink wound bed without slough.  Wound Description (Comments):   Present on Admission: Yes        DATA REVIEW:   CBC  Recent Labs  Lab 01/22/20 0433  WBC 7.0  HGB 10.5*  HCT 32.7*  PLT 285    Chemistries  Recent Labs  Lab 01/19/20 1607 01/19/20 1607 01/20/20 0355  NA 129*   < > 135  K 3.8   < > 3.5  CL 93*   < > 101  CO2 27   < > 26  GLUCOSE 120*   < > 114*  BUN 8   < > 7*  CREATININE 0.73   < > 0.51  CALCIUM 9.4   < > 8.7*  AST 16  --   --   ALT 9  --   --   ALKPHOS 102  --   --   BILITOT 1.0  --   --    < > = values in this interval not displayed.    Microbiology Results   Recent Results (from the past 240 hour(s))  Respiratory Panel by RT PCR  (Flu A&B, Covid) - Nasopharyngeal Swab     Status: None   Collection Time: 01/19/20  8:13 PM   Specimen: Nasopharyngeal Swab  Result Value Ref Range Status   SARS Coronavirus 2 by RT PCR NEGATIVE NEGATIVE Final    Comment: (NOTE) SARS-CoV-2 target nucleic acids are NOT DETECTED.  The SARS-CoV-2 RNA is generally detectable in upper respiratoy specimens during the acute phase of infection. The lowest concentration of SARS-CoV-2 viral copies this assay can detect is 131 copies/mL. A negative result does not preclude SARS-Cov-2 infection and should not be used as the sole basis for treatment or other patient management decisions. A negative result may occur with  improper specimen collection/handling, submission of specimen other than nasopharyngeal swab, presence of viral mutation(s) within the areas targeted by this assay, and inadequate number of viral copies (<131 copies/mL). A negative result must be combined with clinical observations, patient history, and epidemiological information. The expected result is Negative.  Fact Sheet for Patients:  https://www.moore.com/  Fact Sheet for Healthcare Providers:  https://www.young.biz/  This test is no t yet approved or cleared by the [place] FDA and  has been authorized for detection and/or diagnosis of SARS-CoV-2 by FDA under an Emergency Use Authorization (EUA). This EUA will remain  in effect (meaning this test can be used) for the duration of the COVID-19 declaration under Section 564(b)(1) of the Act, 21 U.S.C. section 360bbb-3(b)(1), unless the authorization is terminated or revoked sooner.     Influenza A by PCR NEGATIVE NEGATIVE Final   Influenza B by PCR NEGATIVE NEGATIVE Final    Comment: (NOTE) The Xpert Xpress SARS-CoV-2/FLU/RSV assay is intended as an aid in  the diagnosis of influenza from Nasopharyngeal swab specimens and  should not be used as a sole basis for treatment.  Nasal washings and  aspirates are unacceptable for Xpert Xpress SARS-CoV-2/FLU/RSV  testing.  Fact Sheet for Patients: https://www.moore.com/  Fact Sheet for Healthcare Providers: https://www.young.biz/  This test is not yet approved or cleared by the [place] FDA and  has been authorized for detection and/or diagnosis of SARS-CoV-2 by  FDA under an Emergency Use Authorization (EUA). This EUA will remain  in effect (meaning this test can be used) for the duration of the  Covid-19 declaration under Section 564(b)(1)  of the Act, 21  U.S.C. section 360bbb-3(b)(1), unless the authorization is  terminated or revoked. Performed at [facility], [address] [place], [place] [address]     RADIOLOGY:  No results found.   CODE STATUS:     Code Status Orders  (From admission, onward)         Start     Ordered   01/19/20 2055  Do not attempt resuscitation (DNR)  Continuous       Question Answer Comment  In the event of cardiac or respiratory ARREST Do not call a "code blue"   In the event of cardiac or respiratory ARREST Do not perform Intubation, CPR, defibrillation or ACLS   In the event of cardiac or respiratory ARREST Use medication by any route, position, wound care, and other measures to relive pain and suffering. May use oxygen, suction and manual treatment of airway obstruction as needed for comfort.      01/19/20 2055        Code Status History    Date Active Date Inactive Code Status Order ID Comments User Context   12/12/2019 2252 12/18/2019 2210 DNR [id number]  Andris Baumann, MD ED   12/12/2019 2234 12/12/2019 2252 DNR [id number]  Andris Baumann, MD ED   12/12/2019 2202 12/12/2019 2234 Full Code [id number]  Andris Baumann, MD ED   Advance Care Planning Activity    Advance Directive Documentation     Most Recent Value  Type of Advance Directive Living will, Healthcare Power of Attorney  Pre-existing out of  facility DNR order (yellow form or pink MOST form) --  "MOST" Form in Place? --       TOTAL TIME TAKING CARE OF THIS PATIENT: *35* minutes.    Enedina Finner M.D  [facility]    CC: Primary care physician; Jamelle Haring, MD

## 2020-01-22 NOTE — Progress Notes (Signed)
ANTICOAGULATION CONSULT NOTE  Pharmacy Consult for Heparin Drip Indication: DVT  No Known Allergies  Patient Measurements: Height: 5\' 5"  (165.1 cm) Weight: 60.8 kg (134 lb) IBW/kg (Calculated) : 57 Heparin Dosing Weight: 60.8 kg  Vital Signs: Temp: 97.7 F (36.5 C) (11/08 0525) Temp Source: Oral (11/08 0525) BP: 142/47 (11/08 0525) Pulse Rate: 62 (11/08 0525)  Labs: Recent Labs    01/19/20 1556 01/19/20 1607 01/19/20 1607 01/20/20 0355 01/20/20 1405 01/21/20 0549 01/21/20 0549 01/21/20 1437 01/21/20 2306 01/22/20 0433 01/22/20 0831  HGB  --  11.5*   < > 10.0*  --  10.0*  --   --   --  10.5*  --   HCT  --  35.3*   < > 30.3*  --  30.6*  --   --   --  32.7*  --   PLT  --  305   < > 255  --  264  --   --   --  285  --   APTT >160*  --   --   --   --   --   --   --   --   --   --   LABPROT 13.9  --   --   --   --   --   --   --   --   --   --   INR 1.1  --   --   --   --   --   --   --   --   --   --   HEPARINUNFRC  --   --   --  0.75*   < > 0.71*   < > 0.35 <0.10*  --  0.40  CREATININE  --  0.73  --  0.51  --   --   --   --   --   --   --    < > = values in this interval not displayed.    Estimated Creatinine Clearance: 42.9 mL/min (by C-G formula based on SCr of 0.51 mg/dL).   Medical History: Past Medical History:  Diagnosis Date  . Dementia (HCC)   . Glaucoma   . HTN (hypertension) 02/10/2017  . Osteoporosis   . Thyroid disease     Assessment: Patient is a 86yo female admitted for DVT. Patient is s/p hip replacement 1 month ago, she was discharged on prophylactic Enoxaparin (completed course). Pharmacy consulted for Heparin dosing.  Starting rate 1050 units/hr  11/6: HL @ 0355 = 0.75 - rate decreased to 1000 units/hr 11/6: HL @ 1405 = 0.75 - rate decreased to 900 units/hr 11/6: HL @ 2131 = 0.60 - therapeutic x 1 11/7: HL @ 0549 = 0.71 - rate decreased to 800 units/hr 11/7: HL @ 1437 = 0.35 11/7: HL @ 2306 = < 0.10   1800 units IV X 1 bolus and  increase drip rate to 1000 units/hr.  11/8:  HL @ 0831 = 0.40    Goal of Therapy:  Heparin level 0.3-0.7 units/ml Monitor platelets by anticoagulation protocol: Yes   Plan:  11/8:  HL @ 0831 = 0.40  therapeutic Now transitioning to apixaban  Shacoya Burkhammer A, PharmD 01/22/2020 9:43 AM

## 2020-01-22 NOTE — Progress Notes (Signed)
ANTICOAGULATION CONSULT NOTE  Pharmacy Consult for Heparin Drip Indication: DVT  No Known Allergies  Patient Measurements: Height: 5\' 5"  (165.1 cm) Weight: 60.8 kg (134 lb) IBW/kg (Calculated) : 57 Heparin Dosing Weight: 60.8 kg  Vital Signs: Temp: 98 F (36.7 C) (11/07 2321) Temp Source: Oral (11/07 2321) BP: 123/80 (11/07 2321) Pulse Rate: 131 (11/07 2321)  Labs: Recent Labs    01/19/20 1556 01/19/20 1607 01/19/20 1607 01/20/20 0355 01/20/20 1405 01/21/20 0549 01/21/20 1437 01/21/20 2306  HGB  --  11.5*   < > 10.0*  --  10.0*  --   --   HCT  --  35.3*  --  30.3*  --  30.6*  --   --   PLT  --  305  --  255  --  264  --   --   APTT >160*  --   --   --   --   --   --   --   LABPROT 13.9  --   --   --   --   --   --   --   INR 1.1  --   --   --   --   --   --   --   HEPARINUNFRC  --   --   --  0.75*   < > 0.71* 0.35 <0.10*  CREATININE  --  0.73  --  0.51  --   --   --   --    < > = values in this interval not displayed.    Estimated Creatinine Clearance: 42.9 mL/min (by C-G formula based on SCr of 0.51 mg/dL).   Medical History: Past Medical History:  Diagnosis Date  . Dementia (HCC)   . Glaucoma   . HTN (hypertension) 02/10/2017  . Osteoporosis   . Thyroid disease     Assessment: Patient is a 86yo female admitted for DVT. Patient is s/p hip replacement 1 month ago, she was discharged on prophylactic Enoxaparin (completed course). Pharmacy consulted for Heparin dosing.  Starting rate 1050 units/hr  11/6: HL @ 0355 = 0.75 - rate decreased to 1000 units/hr 11/6: HL @ 1405 = 0.75 - rate decreased to 900 units/hr 11/6: HL @ 2131 = 0.60 - therapeutic x 1 11/7: HL @ 0549 = 0.71 - rate decreased to 800 units/hr 11/7: HL @ 1437 = 0.35 11/7: HL @ 2306 = < 0.10   Goal of Therapy:  Heparin level 0.3-0.7 units/ml Monitor platelets by anticoagulation protocol: Yes   Plan:  11/7:  HL @ 2306 = < 0.1  Spoke with RN who said heparin gtt had not been turned off  since last level was drawn.  Will order Heparin 1800 units IV X 1 bolus and increase drip rate to 1000 units/hr.  Will recheck HL 8 hrs after rate change.   Divonte Senger D, PharmD 01/22/2020 12:27 AM

## 2020-01-22 NOTE — Telephone Encounter (Signed)
Transition Care Management Follow-up Telephone Call  Date of discharge and from where: 01/22/20  How have you been since you were released from the hospital? Pt states she is doing okay  Any questions or concerns? No  Items Reviewed:  Did the pt receive and understand the discharge instructions provided? Yes   Medications obtained and verified? Yes   Other? No   Any new allergies since your discharge? No   Dietary orders reviewed? Yes  Do you have support at home? Yes   Home Care and Equipment/Supplies: Were home health services ordered? yes If so, what is the name of the agency? [facility]  Has the agency set up a time to come to the patient's home? no Were any new equipment or medical supplies ordered?  No  Functional Questionnaire: (I = Independent and D = Dependent) ADLs: D  Bathing/Dressing- D  Meal Prep- D  Eating- I  Maintaining continence- I  Transferring/Ambulation- I  Managing Meds- D  Follow up appointments reviewed:   PCP Hospital f/u appt confirmed? Yes  Scheduled to see Dr. Dorris Fetch on 01/24/20 @ 11:00.  Are transportation arrangements needed? No   If their condition worsens, is the pt aware to call PCP or go to the Emergency Dept.? Yes  Was the patient provided with contact information for the PCP's office or ED? Yes  Was to pt encouraged to call back with questions or concerns? Yes

## 2020-01-22 NOTE — Progress Notes (Signed)
[facility] Daily Progress Note   Subjective: Patient with history of dementia. Sitting in bed comfortably with family at bedside.  Objective: Vitals:   01/21/20 2321 01/22/20 0147 01/22/20 0525 01/22/20 0948  BP: 123/80 119/62 (!) 142/47 (!) 145/90  Pulse: (!) 131 (!) 127 62 (!) 58  Resp: 19 19 18 16   Temp: 98 F (36.7 C) 97.6 F (36.4 C) 97.7 F (36.5 C) 98.4 F (36.9 C)  TempSrc: Oral Oral Oral Oral  SpO2: 98% 95% 100% 95%  Weight:      Height:        Intake/Output Summary (Last 24 hours) at 01/22/2020 1008 Last data filed at 01/22/2020 0100 Gross per 24 hour  Intake 280.22 ml  Output 950 ml  Net -669.78 ml   Physical Exam: A&Ox1, NAD CV: RRR Pulmonary: CTA Bilaterally Abdomen: Soft, Nontender, Nondistended Vascular:  Left lower extremity: Thigh soft. Calf soft. Extremities warm distally toes. There is no acute vascular compromise noted to extremity at this time. Motor/sensory is intact. Good capillary refill.   Laboratory: CBC    Component Value Date/Time   WBC 7.0 01/22/2020 0433   HGB 10.5 (L) 01/22/2020 0433   HGB 14.1 12/23/2011 1525   HCT 32.7 (L) 01/22/2020 0433   HCT 42.2 12/23/2011 1525   PLT 285 01/22/2020 0433   PLT 262 12/23/2011 1525   BMET    Component Value Date/Time   NA 135 01/20/2020 0355   NA 143 12/23/2011 1525   K 3.5 01/20/2020 0355   K 4.2 12/23/2011 1525   CL 101 01/20/2020 0355   CL 107 12/23/2011 1525   CO2 26 01/20/2020 0355   CO2 30 12/23/2011 1525   GLUCOSE 114 (H) 01/20/2020 0355   GLUCOSE 129 (H) 12/23/2011 1525   BUN 7 (L) 01/20/2020 0355   BUN 10 12/23/2011 1525   CREATININE 0.51 01/20/2020 0355   CREATININE 0.74 05/05/2019 1408   CALCIUM 8.7 (L) 01/20/2020 0355   CALCIUM 9.5 12/23/2011 1525   GFRNONAA >60 01/20/2020 0355   GFRNONAA 72 05/05/2019 1408   GFRAA >60 12/17/2019 0829   GFRAA 83 05/05/2019 1408   Assessment/Planning: The patient is an 86 year old who presented with left lower  extremity DVT  1) after a long decision with the daughter and granddaughter at the bedside the family does not want to pursue any endovascular intervention at this time.  The family feels the patient's physical exam has improved greatly with initiation of blood thinning medication. 2) we discussed how we do have a 2-week window to intervene.  Will see the patient back in approximately one week to undergo an ultrasound in our office to assess for any propagation. 3) the patient / her family will call our office if they notice any worsening in symptoms and we can revisit intervention at that time. 4) will transition from heparin to Eliquis. 5) vascular surgery to sign off at this time.  Discussed with Dr. 92 Steen Bisig PA-C 01/22/2020 10:08 AM

## 2020-01-23 NOTE — Progress Notes (Signed)
Patient ID: [name], female    DOB: Oct 05, 1927, 86 y.o.   MRN: [id number]  PCP: Jamelle Haring, MD  Chief Complaint  Patient presents with  . Hospitalization Follow-up    Subjective:   [name] is a 86 y.o. female, presents to clinic with CC of the following:  Chief Complaint  Patient presents with  . Hospitalization Follow-up    HPI:  Patient is an 86 year old female Last visit with me was 01/19/2020 for unilateral leg swelling, and after ultrasound confirmed significant clot, sent to the emergency room and she was admitted. Follows up today after hospital discharge.  Her daughter was with her today.  She has a history of dementia, and she was admitted to the hospital with a right hip fracture 12/12/2019     Surgery was performed by Dr. Joice Lofts on 12/13/2019. The patient has been at a rehab facility following her surgery. The patient was taking Lovenox daily for DVT prophylaxis after the surgery.  Last follow-up with surgery was 12/25/2019, with the Lovenox recommended to be continued for 1 more week.  And a follow-up plan for 4 weeks.  She hasd been receiving PT as an outpatient before most recent hospitalization for the significant blood clot concern.  The discharge summary from her most recent hospitalization is as follows:  JeraldineGriffinis a89 y.o.Caucasian femalewith a known history of hypertension, dementia, glaucoma, osteoporosis and hypothyroidism, who presented to the emergency room with acute onset of left lower extremity pain and swelling. She is about 1 months status post 2 hip replacement. She has been on prophylactic Lovenoxpostoperatively and was later discontinued. The patient has not been ambulating much lately.   Left lower extremity extensive deep venous thrombosis. -continueheron IV heparin--now chang to po eliquis -Vascular surgery consultation withDr. Wilfred Curtis --patient's daughter and  granddaughter has changed their mind for patient to undergo left LE thrombectomy--procedure cancelled --leg swelling much better -- Patient recently had hip surgery in September 2021. She did receive two weeks of DVT prophylaxis   Hyponatremia. -hydrated with IV normal saline-- improved  Essential hypertension -continue Toprol-XL.  Hypothyroidism. -onEuthyrox  Dyslipidemia. -continue statin therapy.  Glaucoma. -continue eye drops  DVT prophylaxis. -on IV heparin.   Dementia at baseline chronic without behavioral disturbance -supportive care Procedures:none Family communication: spoke with [name] granddaughter and [name] daughterin the room Consults: vascular surgery CODE STATUS:DNR prior to arrival DVT Prophylaxis: po eliquis  Dispo: The patient is from: Home Anticipated d/c is to: Home Anticipated d/c date is: today Patient currently is medically stable to d/c. D/c home  TOC for discharge planning--Home with HHPT/RN/OT  D/w dter and granddter and agrees with plan  CONSULTS OBTAINED:  Treatment Team:  Bertram Denver, MD  DRUG ALLERGIES:  No Known Allergies  DISCHARGE MEDICATIONS:   Allergies as of 01/22/2020   No Known Allergies              Medication List        STOP taking these medications       aspirin EC 81 MG tablet   bimatoprost 0.01 % Soln Commonly known as: LUMIGAN   brinzolamide 1 % ophthalmic suspension Commonly known as: AZOPT   dorzolamide 2 % ophthalmic solution Commonly known as: TRUSOPT   enoxaparin 40 MG/0.4ML injection Commonly known as: LOVENOX   multivitamin with minerals Tabs tablet   polyethylene glycol 17 g packet Commonly known as: MIRALAX / GLYCOLAX   rosuvastatin 5 MG tablet Commonly known  as: CRESTOR   senna 8.6 MG Tabs tablet Commonly known as: SENOKOT   traMADol 50 MG tablet Commonly known as: ULTRAM   VITAMIN D  (ERGOCALCIFEROL) PO             TAKE these medications       acetaminophen 325 MG tablet Commonly known as: TYLENOL Take 1-2 tablets (325-650 mg total) by mouth every 6 (six) hours as needed for mild pain (pain score 1-3 or temp > 100.5).   apixaban 5 MG Tabs tablet Commonly known as: ELIQUIS Take 2 tablets (10 mg total) by mouth 2 (two) times daily. And then from 01/29/2020 take 1 tab (5 mg ) two times daily   Calcium 600-200 MG-UNIT tablet Take 1 tablet by mouth daily.   Euthyrox 75 MCG tablet Generic drug: levothyroxine TAKE 1 TABLET BY MOUTH ONCE DAILY BEFORE BREAKFAST   furosemide 20 MG tablet Commonly known as: LASIX Take 20 mg by mouth daily.   memantine 10 MG tablet Commonly known as: NAMENDA Take 1 tablet by mouth twice daily What changed: how much to take   metoprolol succinate 25 MG 24 hr tablet Commonly known as: TOPROL-XL TAKE 1 TABLET BY MOUTH IN THE MORNING AND 2 NIGHTLY                        Discharge Care Instructions  (From admission, onward)                        Start     Ordered    01/22/20 0000  Discharge wound care:       Comments: Apply foam padding   01/22/20 1209            Since discharge, she notes she has been doing okay.  The daughter notes she has been more calm at home, and was struggling some in the hospital environment with the change from being home noted.  They decided not to have the thrombectomy procedure and she was discharged taking Eliquis.  The lower extremity swelling has lessened some, still more significant on the left versus the right.  The patient denied any pain, no increased redness or warmth in the left lower extremity.  No bleeding concerns.   To review,  She has a significant cardiac history, and shelastfollowed up with cardiology on 12/12/2019 with their assessment and plan as follows:      86 y.o. female with  1. Nonrheumatic aortic valve insufficiency  2. Essential  hypertension  3. Dilated cardiomyopathy (CMS-HCC)  4. Late onset Alzheimer's dementia without behavioral disturbance (CMS-HCC)  5. Hyperlipidemia, unspecified hyperlipidemia type   86 year old female with a history of hypertension, hyperlipidemia, Alzheimer's dementia, and moderate aortic insufficiency. Recent 2D echocardiogram reveals moderately reduced left ventricular function of 25-30% with inferior and septal wall akinesis, compared to prior echocardiogram in 2016 which revealed normal LV function without wall motion abnormalities. The patient is currently asymptomatic, denying chest pain, shortness of breath, palpitations, peripheral edema, or syncope. Patient has essential hypertension, systolic blood pressure mildly elevated today on metoprolol. We discussed with the patient's son the option for evaluating her reduced left ventricular function, including nuclear stress testing versus taking a conservative approach and medically managing. We suggested pursuing a conservative approach as the patient is elderly, has dementia, and is asymptomatic.   Plan   1. Continue current medications 2. Counseled patient about low-sodium diet 3. DASH diet printed instructions given to the  patient 4. Counseled patient about low-cholesterol diet  5. Heart healthy diet printed instructions given to the patient 6. Conservative management in light of patient's advanced age and baseline dementia 7. Return to clinic for follow-up in 6 months  Return in about 6 months (around 06/10/2020).   Her last echocardiogramwas performed on 04/01/2018, which revealed LVEF 25-30% with moderate aortic and mitral regurgitation, mild tricuspid and pulmonic regurgitation, moderate left atrial enlargement, and mild pulmonary hypertension. This LV function was significantly reduced from the previous echocardiogram in 2016, which revealed LVEF greater than 55%.     Patient Active Problem List   Diagnosis Date Noted  . DVT of  deep femoral vein, left (HCC)   . Left leg DVT (HCC) 01/19/2020  . Pressure injury of skin 01/19/2020  . Closed right hip fracture (HCC) 12/12/2019  . Fall 12/12/2019  . Preoperative clearance 12/12/2019  . Chronic systolic heart failure (HCC) 10/12/2019  . At risk for fall due to comorbid condition 10/12/2019  . Senile purpura (HCC) 08/31/2019  . Dilated cardiomyopathy (HCC) 05/03/2019  . Osteopenia 06/22/2017  . HTN (hypertension) 02/10/2017  . Dementia of the Alzheimer's type without behavioral disturbance (HCC) 11/04/2015  . Gradual-onset memory impairment 10/25/2015  . Hypothyroidism 05/07/2015  . Hyperlipidemia 05/07/2015  . Hyperglycemia 05/07/2015  . Aortic insufficiency 06/14/2013      Current Outpatient Medications:  .  acetaminophen (TYLENOL) 325 MG tablet, Take 1-2 tablets (325-650 mg total) by mouth every 6 (six) hours as needed for mild pain (pain score 1-3 or temp > 100.5)., Disp: , Rfl:  .  apixaban (ELIQUIS) 5 MG TABS tablet, Take 2 tablets (10 mg total) by mouth 2 (two) times daily. And then from 01/29/2020 take 1 tab (5 mg ) two times daily, Disp: 60 tablet, Rfl: 3 .  Calcium 600-200 MG-UNIT tablet, Take 1 tablet by mouth daily., Disp: , Rfl:  .  EUTHYROX 75 MCG tablet, TAKE 1 TABLET BY MOUTH ONCE DAILY BEFORE BREAKFAST, Disp: 90 tablet, Rfl: 3 .  furosemide (LASIX) 20 MG tablet, Take 20 mg by mouth daily., Disp: , Rfl:  .  memantine (NAMENDA) 10 MG tablet, Take 1 tablet by mouth twice daily (Patient taking differently: Take 5 mg by mouth 2 (two) times daily. ), Disp: 180 tablet, Rfl: 1 .  metoprolol succinate (TOPROL-XL) 25 MG 24 hr tablet, TAKE 1 TABLET BY MOUTH IN THE MORNING AND 2 NIGHTLY, Disp: 270 tablet, Rfl: 1   No Known Allergies   Past Surgical History:  Procedure Laterality Date  . INTRAMEDULLARY (IM) NAIL INTERTROCHANTERIC Right 12/13/2019   Procedure: INTRAMEDULLARY (IM) NAIL INTERTROCHANTRIC;  Surgeon: Christena Flake, MD;  Location: [facility] ORS;   Service: Orthopedics;  Laterality: Right;  . NO PAST SURGERIES       Family History  Problem Relation Age of Onset  . Cancer Father   . Hyperlipidemia Sister   . Heart disease Brother   . Stroke Neg Hx      Social History   Tobacco Use  . Smoking status: Never Smoker  . Smokeless tobacco: Never Used  . Tobacco comment: smoking cessation materials not required  Substance Use Topics  . Alcohol use: No    Alcohol/week: 0.0 standard drinks    With staff assistance, above reviewed with the patient/caregiver today.  ROS: As per HPI, otherwise no specific complaints on a limited and focused system review   Results for orders placed or performed during the hospital encounter of 01/19/20 (from the past 72 hour(s))  Heparin level (unfractionated)     Status: None   Collection Time: 01/21/20  2:37 PM  Result Value Ref Range   Heparin Unfractionated 0.35 0.30 - 0.70 IU/mL    Comment: (NOTE) If heparin results are below expected values, and patient dosage has  been confirmed, suggest follow up testing of antithrombin III levels. Performed at [facility], [address] [place], [place] [address]   Heparin level (unfractionated)     Status: Abnormal   Collection Time: 01/21/20 11:06 PM  Result Value Ref Range   Heparin Unfractionated <0.10 (L) 0.30 - 0.70 IU/mL    Comment: (NOTE) If heparin results are below expected values, and patient dosage has  been confirmed, suggest follow up testing of antithrombin III levels. Performed at [facility], [address] [place], [place] [address]   CBC     Status: Abnormal   Collection Time: 01/22/20  4:33 AM  Result Value Ref Range   WBC 7.0 4.0 - 10.5 K/uL   RBC 3.41 (L) 3.87 - 5.11 MIL/uL   Hemoglobin 10.5 (L) 12.0 - 15.0 g/dL   HCT 09.8 (L) 36 - 46 %   MCV 95.9 80.0 - 100.0 fL   MCH 30.8 26.0 - 34.0 pg   MCHC 32.1 30.0 - 36.0 g/dL   RDW 11.9 14.7 - 82.9 %   Platelets 285 150 - 400 K/uL   nRBC 0.0 0.0 -  0.2 %    Comment: Performed at [facility], [address] [place], [place] [address]  Heparin level (unfractionated)     Status: None   Collection Time: 01/22/20  8:31 AM  Result Value Ref Range   Heparin Unfractionated 0.40 0.30 - 0.70 IU/mL    Comment: (NOTE) If heparin results are below expected values, and patient dosage has  been confirmed, suggest follow up testing of antithrombin III levels. Performed at [facility], [address] [place], [place] [address]      PHQ2/9: Depression screen [facility] 2/9 01/24/2020 01/19/2020 08/31/2019 05/05/2019 10/21/2018  Decreased Interest 0 0 0 0 0  Down, Depressed, Hopeless 0 0 0 0 0  PHQ - 2 Score 0 0 0 0 0  Altered sleeping 1 1 0 0 -  Tired, decreased energy 1 1 0 0 -  Change in appetite 3 3 0 0 -  Feeling bad or failure about yourself  0 0 0 - -  Trouble concentrating 3 3 0 0 -  Moving slowly or fidgety/restless 0 0 0 0 -  Suicidal thoughts 0 0 0 0 -  PHQ-9 Score 8 8 0 0 -  Difficult doing work/chores Not difficult at all Not difficult at all Not difficult at all Not difficult at all -   PHQ-2/9 Result is   Fall Risk: Fall Risk  01/24/2020 01/19/2020 10/12/2019 08/31/2019 05/05/2019  Falls in the past year? 1 1 0 0 -  Number falls in past yr: 0 0 0 0 0  Injury with Fall? 1 1 0 0 0  Risk for fall due to : - History of fall(s);Impaired balance/gait;Impaired mobility - - -  Risk for fall due to: Comment - - - - -  Follow up - - - - -      Objective:   Vitals:   01/24/20 1036  BP: (!) 112/50  Pulse: 61  Resp: 16  Temp: 97.6 F (36.4 C)  TempSrc: Oral  SpO2: 98%  Weight: 134 lb (60.8 kg)  Height:  (  1.651 m)    Body mass index is 22.3 kg/m.  Physical Exam   NAD, masked, pleasant, patient in a wheelchair in the office and examined in the wheelchair. HEENT - [place]/AT, sclera anicteric,+ glasses, conj - non-inj'ed, Neck - supple,  Car - RRR with1-II/6 systolic murmur present, not new Pulm- RR and  effort normal at rest, CTA without wheeze or rales Ext -significant 1-2+ lower extremity edema more diffusely noted from below the knee down to the ankle on the left, no marked erythema, no marked discomfort with palpation diffusely,  no cords, no increased warmth, trace lower extremity edema on the right, Neuro/psychiatric - affect was not flat,  Alert, appropriate with conversation  Results for orders placed or performed during the hospital encounter of 01/19/20  Respiratory Panel by RT PCR (Flu A&B, Covid) - Nasopharyngeal Swab   Specimen: Nasopharyngeal Swab  Result Value Ref Range   SARS Coronavirus 2 by RT PCR NEGATIVE NEGATIVE   Influenza A by PCR NEGATIVE NEGATIVE   Influenza B by PCR NEGATIVE NEGATIVE  CBC with Differential  Result Value Ref Range   WBC 8.2 4.0 - 10.5 K/uL   RBC 3.72 (L) 3.87 - 5.11 MIL/uL   Hemoglobin 11.5 (L) 12.0 - 15.0 g/dL   HCT 17.4 (L) 36 - 46 %   MCV 94.9 80.0 - 100.0 fL   MCH 30.9 26.0 - 34.0 pg   MCHC 32.6 30.0 - 36.0 g/dL   RDW 08.1 44.8 - 18.5 %   Platelets 305 150 - 400 K/uL   nRBC 0.0 0.0 - 0.2 %   Neutrophils Relative % 65 %   Neutro Abs 5.4 1.7 - 7.7 K/uL   Lymphocytes Relative 19 %   Lymphs Abs 1.5 0.7 - 4.0 K/uL   Monocytes Relative 10 %   Monocytes Absolute 0.8 0.1 - 1.0 K/uL   Eosinophils Relative 4 %   Eosinophils Absolute 0.3 0.0 - 0.5 K/uL   Basophils Relative 1 %   Basophils Absolute 0.1 0.0 - 0.1 K/uL   Immature Granulocytes 1 %   Abs Immature Granulocytes 0.05 0.00 - 0.07 K/uL  Comprehensive metabolic panel  Result Value Ref Range   Sodium 129 (L) 135 - 145 mmol/L   Potassium 3.8 3.5 - 5.1 mmol/L   Chloride 93 (L) 98 - 111 mmol/L   CO2 27 22 - 32 mmol/L   Glucose, Bld 120 (H) 70 - 99 mg/dL   BUN 8 8 - 23 mg/dL   Creatinine, Ser 6.31 0.44 - 1.00 mg/dL   Calcium 9.4 8.9 - 49.7 mg/dL   Total Protein 7.3 6.5 - 8.1 g/dL   Albumin 3.4 (L) 3.5 - 5.0 g/dL   AST 16 15 - 41 U/L   ALT 9 0 - 44 U/L   Alkaline  Phosphatase 102 38 - 126 U/L   Total Bilirubin 1.0 0.3 - 1.2 mg/dL   GFR, Estimated >02 >63 mL/min   Anion gap 9 5 - 15  Basic metabolic panel  Result Value Ref Range   Sodium 135 135 - 145 mmol/L   Potassium 3.5 3.5 - 5.1 mmol/L   Chloride 101 98 - 111 mmol/L   CO2 26 22 - 32 mmol/L   Glucose, Bld 114 (H) 70 - 99 mg/dL   BUN 7 (L) 8 - 23 mg/dL   Creatinine, Ser 7.85 0.44 - 1.00 mg/dL   Calcium 8.7 (L) 8.9 - 10.3 mg/dL   GFR, Estimated >88 >50 mL/min   Anion gap 8  5 - 15  CBC  Result Value Ref Range   WBC 6.9 4.0 - 10.5 K/uL   RBC 3.21 (L) 3.87 - 5.11 MIL/uL   Hemoglobin 10.0 (L) 12.0 - 15.0 g/dL   HCT 16.1 (L) 36 - 46 %   MCV 94.4 80.0 - 100.0 fL   MCH 31.2 26.0 - 34.0 pg   MCHC 33.0 30.0 - 36.0 g/dL   RDW 09.6 04.5 - 40.9 %   Platelets 255 150 - 400 K/uL   nRBC 0.0 0.0 - 0.2 %  Protime-INR  Result Value Ref Range   Prothrombin Time 13.9 11.4 - 15.2 seconds   INR 1.1 0.8 - 1.2  APTT  Result Value Ref Range   aPTT >160 (HH) 24 - 36 seconds  Heparin level (unfractionated)  Result Value Ref Range   Heparin Unfractionated 0.75 (H) 0.30 - 0.70 IU/mL  Heparin level (unfractionated)  Result Value Ref Range   Heparin Unfractionated 0.75 (H) 0.30 - 0.70 IU/mL  Heparin level (unfractionated)  Result Value Ref Range   Heparin Unfractionated 0.60 0.30 - 0.70 IU/mL  Heparin level (unfractionated)  Result Value Ref Range   Heparin Unfractionated 0.71 (H) 0.30 - 0.70 IU/mL  CBC  Result Value Ref Range   WBC 6.3 4.0 - 10.5 K/uL   RBC 3.28 (L) 3.87 - 5.11 MIL/uL   Hemoglobin 10.0 (L) 12.0 - 15.0 g/dL   HCT 81.1 (L) 36 - 46 %   MCV 93.3 80.0 - 100.0 fL   MCH 30.5 26.0 - 34.0 pg   MCHC 32.7 30.0 - 36.0 g/dL   RDW 91.4 78.2 - 95.6 %   Platelets 264 150 - 400 K/uL   nRBC 0.0 0.0 - 0.2 %  Heparin level (unfractionated)  Result Value Ref Range   Heparin Unfractionated 0.35 0.30 - 0.70 IU/mL  Heparin level (unfractionated)  Result Value Ref Range   Heparin Unfractionated  <0.10 (L) 0.30 - 0.70 IU/mL  CBC  Result Value Ref Range   WBC 7.0 4.0 - 10.5 K/uL   RBC 3.41 (L) 3.87 - 5.11 MIL/uL   Hemoglobin 10.5 (L) 12.0 - 15.0 g/dL   HCT 21.3 (L) 36 - 46 %   MCV 95.9 80.0 - 100.0 fL   MCH 30.8 26.0 - 34.0 pg   MCHC 32.1 30.0 - 36.0 g/dL   RDW 08.6 57.8 - 46.9 %   Platelets 285 150 - 400 K/uL   nRBC 0.0 0.0 - 0.2 %  Heparin level (unfractionated)  Result Value Ref Range   Heparin Unfractionated 0.40 0.30 - 0.70 IU/mL       Assessment & Plan:    1. Encounter for examination following treatment at hospital   2. Acute deep vein thrombosis (DVT) of proximal vein of left lower extremity (HCC) Patient is currently on Eliquis, with the thrombectomy offered while hospitalized not pursued.  He still has swelling more on the left lower extremity than right, although has improved some since hospitalized and treatment initiated.  Continue the Eliquis presently. Discussed the importance of fall prevention, and the daughter is with her 24/7 presently and aware of this importance. Still trying to elevate the leg is most possible, as can be helpful.   3. History of fracture of right hip She did have surgery prior to this most recent hospitalization with the blood clot concern.  Continue with home health involvement and therapy to help after that prior fracture and surgical procedure   4. Alzheimer's dementia without behavioral disturbance,  unspecified timing of dementia onset (HCC) Continue current management, including the medication Namenda She does have good support and supervision in the present home environment with family very supportive and helpful presently.  5. Nonrheumatic aortic valve insufficiency 6. Chronic systolic heart failure (HCC) 7. Essential hypertension  Continues to follow with cardiology, next visit in March.  A handicap placard request was completed for her today as well.  Discussed moving her follow-up from next week to 4 to 5 weeks,  as the daughter noted some of the difficulty getting her to these visits, and this was felt best to do. Should follow-up sooner as needed. Do not feel checking further labs today is needed    Jamelle Haring, MD 01/24/20 11:41 AM

## 2020-01-24 ENCOUNTER — Ambulatory Visit (INDEPENDENT_AMBULATORY_CARE_PROVIDER_SITE_OTHER): Payer: Medicare Other | Admitting: Internal Medicine

## 2020-01-24 ENCOUNTER — Encounter: Payer: Self-pay | Admitting: Internal Medicine

## 2020-01-24 ENCOUNTER — Other Ambulatory Visit: Payer: Self-pay

## 2020-01-24 VITALS — BP 112/50 | HR 61 | Temp 97.6°F | Resp 16 | Ht 65.0 in | Wt 134.0 lb

## 2020-01-24 DIAGNOSIS — I824Y2 Acute embolism and thrombosis of unspecified deep veins of left proximal lower extremity: Secondary | ICD-10-CM

## 2020-01-24 DIAGNOSIS — Z8781 Personal history of (healed) traumatic fracture: Secondary | ICD-10-CM | POA: Diagnosis not present

## 2020-01-24 DIAGNOSIS — I10 Essential (primary) hypertension: Secondary | ICD-10-CM

## 2020-01-24 DIAGNOSIS — I5022 Chronic systolic (congestive) heart failure: Secondary | ICD-10-CM

## 2020-01-24 DIAGNOSIS — I351 Nonrheumatic aortic (valve) insufficiency: Secondary | ICD-10-CM

## 2020-01-24 DIAGNOSIS — G309 Alzheimer's disease, unspecified: Secondary | ICD-10-CM | POA: Diagnosis not present

## 2020-01-24 DIAGNOSIS — Z09 Encounter for follow-up examination after completed treatment for conditions other than malignant neoplasm: Secondary | ICD-10-CM | POA: Diagnosis not present

## 2020-01-24 DIAGNOSIS — F0280 Dementia in other diseases classified elsewhere without behavioral disturbance: Secondary | ICD-10-CM

## 2020-01-25 ENCOUNTER — Telehealth: Payer: Self-pay

## 2020-01-25 DIAGNOSIS — Z8781 Personal history of (healed) traumatic fracture: Secondary | ICD-10-CM

## 2020-01-25 DIAGNOSIS — I824Y2 Acute embolism and thrombosis of unspecified deep veins of left proximal lower extremity: Secondary | ICD-10-CM

## 2020-01-25 NOTE — Telephone Encounter (Signed)
Copied from CRM [id number]. Topic: General - Inquiry >> Jan 25, 2020 12:03 PM Adrian Prince D wrote: Reason for CRM: Patient daughter called and requested a prescription for a lift chair for her mother. She contacted the insurance and in order for them to pay for it, it has to be in  a prescription. When it is ready the daughter will pick it up please call her at [phone] and her name is [name].

## 2020-01-25 NOTE — Telephone Encounter (Signed)
Could you help print out this prescription in Epic as requested. I do think it is indicated. Thanks, [facility]

## 2020-01-31 ENCOUNTER — Ambulatory Visit: Payer: Medicare Other | Admitting: Internal Medicine

## 2020-02-01 ENCOUNTER — Other Ambulatory Visit (INDEPENDENT_AMBULATORY_CARE_PROVIDER_SITE_OTHER): Payer: Self-pay | Admitting: Vascular Surgery

## 2020-02-01 DIAGNOSIS — I82412 Acute embolism and thrombosis of left femoral vein: Secondary | ICD-10-CM

## 2020-02-02 ENCOUNTER — Other Ambulatory Visit: Payer: Self-pay

## 2020-02-02 ENCOUNTER — Ambulatory Visit (INDEPENDENT_AMBULATORY_CARE_PROVIDER_SITE_OTHER): Payer: Medicare Other

## 2020-02-02 ENCOUNTER — Encounter (INDEPENDENT_AMBULATORY_CARE_PROVIDER_SITE_OTHER): Payer: Self-pay | Admitting: Nurse Practitioner

## 2020-02-02 ENCOUNTER — Ambulatory Visit (INDEPENDENT_AMBULATORY_CARE_PROVIDER_SITE_OTHER): Payer: Medicare Other | Admitting: Nurse Practitioner

## 2020-02-02 VITALS — BP 147/74 | HR 58 | Ht 64.0 in | Wt 134.0 lb

## 2020-02-02 DIAGNOSIS — I82412 Acute embolism and thrombosis of left femoral vein: Secondary | ICD-10-CM

## 2020-02-02 DIAGNOSIS — E782 Mixed hyperlipidemia: Secondary | ICD-10-CM

## 2020-02-09 ENCOUNTER — Encounter (INDEPENDENT_AMBULATORY_CARE_PROVIDER_SITE_OTHER): Payer: Self-pay | Admitting: Nurse Practitioner

## 2020-02-09 NOTE — Progress Notes (Signed)
Subjective:    Patient ID: [name], female    DOB: 1927/09/17, 86 y.o.   MRN: [id number] Chief Complaint  Patient presents with  . Follow-up    1 week [facility] LLE ven DVT     The patient presents to the office for evaluation of DVT.  DVT was identified at [facility] by Duplex ultrasound.  The initial symptoms were pain and swelling in the lower extremity.  The patient notes the leg continues to   Symptoms are much better with elevation.  The patient notes minimal edema in the morning which steadily worsens throughout the day.    The patient has been using compression therapy at this point.  No SOB or pleuritic chest pains.  No cough or hemoptysis.  No blood per rectum or blood in any sputum.  No excessive bruising per the patient.   No evidence of DVT seen in the left lower extremity today on duplex.   Review of Systems  Cardiovascular: Positive for leg swelling.  Neurological: Positive for weakness.       Objective:   Physical Exam Vitals reviewed.  HENT:     Head: Normocephalic.  Cardiovascular:     Rate and Rhythm: Normal rate.  Pulmonary:     Effort: Pulmonary effort is normal.  Musculoskeletal:     Left lower leg: Edema present.  Neurological:     Mental Status: She is alert and oriented to person, place, and time. Mental status is at baseline.     Motor: Weakness present.  Psychiatric:        Mood and Affect: Mood normal.        Behavior: Behavior normal.        Thought Content: Thought content normal.        Cognition and Memory: Cognition is impaired. Memory is impaired.        Judgment: Judgment normal.     BP (!) 147/74   Pulse (!) 58   Ht 5\' 4"  (1.626 m)   Wt 134 lb (60.8 kg)   BMI 23.00 kg/m   Past Medical History:  Diagnosis Date  . Dementia (HCC)   . Glaucoma   . HTN (hypertension) 02/10/2017  . Osteoporosis   . Thyroid disease     Social History   Socioeconomic History  . Marital status: Widowed    Spouse name: Not on file    . Number of children: 6  . Years of education: Not on file  . Highest education level: 10th grade  Occupational History    Employer: RETIRED  Tobacco Use  . Smoking status: Never Smoker  . Smokeless tobacco: Never Used  . Tobacco comment: smoking cessation materials not required  Vaping Use  . Vaping Use: Never used  Substance and Sexual Activity  . Alcohol use: No    Alcohol/week: 0.0 standard drinks  . Drug use: No  . Sexual activity: Not Currently  Other Topics Concern  . Not on file  Social History Narrative  . Not on file   Social Determinants of Health   Financial Resource Strain:   . Difficulty of Paying Living Expenses: Not on file  Food Insecurity:   . Worried About 02/12/2017 in the Last Year: Not on file  . Ran Out of Food in the Last Year: Not on file  Transportation Needs:   . Lack of Transportation (Medical): Not on file  . Lack of Transportation (Non-Medical): Not on file  Physical Activity:   .  Days of Exercise per Week: Not on file  . Minutes of Exercise per Session: Not on file  Stress:   . Feeling of Stress : Not on file  Social Connections:   . Frequency of Communication with Friends and Family: Not on file  . Frequency of Social Gatherings with Friends and Family: Not on file  . Attends Religious Services: Not on file  . Active Member of Clubs or Organizations: Not on file  . Attends Banker Meetings: Not on file  . Marital Status: Not on file  Intimate Partner Violence:   . Fear of Current or Ex-Partner: Not on file  . Emotionally Abused: Not on file  . Physically Abused: Not on file  . Sexually Abused: Not on file    Past Surgical History:  Procedure Laterality Date  . INTRAMEDULLARY (IM) NAIL INTERTROCHANTERIC Right 12/13/2019   Procedure: INTRAMEDULLARY (IM) NAIL INTERTROCHANTRIC;  Surgeon: Christena Flake, MD;  Location: [facility] ORS;  Service: Orthopedics;  Laterality: Right;  . NO PAST SURGERIES      Family History   Problem Relation Age of Onset  . Cancer Father   . Hyperlipidemia Sister   . Heart disease Brother   . Stroke Neg Hx     No Known Allergies  CBC Latest Ref Rng & Units 01/22/2020 01/21/2020 01/20/2020  WBC 4.0 - 10.5 K/uL 7.0 6.3 6.9  Hemoglobin 12.0 - 15.0 g/dL 10.5(L) 10.0(L) 10.0(L)  Hematocrit 36 - 46 % 32.7(L) 30.6(L) 30.3(L)  Platelets 150 - 400 K/uL 285 264 255      CMP     Component Value Date/Time   NA 135 01/20/2020 0355   NA 143 12/23/2011 1525   K 3.5 01/20/2020 0355   K 4.2 12/23/2011 1525   CL 101 01/20/2020 0355   CL 107 12/23/2011 1525   CO2 26 01/20/2020 0355   CO2 30 12/23/2011 1525   GLUCOSE 114 (H) 01/20/2020 0355   GLUCOSE 129 (H) 12/23/2011 1525   BUN 7 (L) 01/20/2020 0355   BUN 10 12/23/2011 1525   CREATININE 0.51 01/20/2020 0355   CREATININE 0.74 05/05/2019 1408   CALCIUM 8.7 (L) 01/20/2020 0355   CALCIUM 9.5 12/23/2011 1525   PROT 7.3 01/19/2020 1607   PROT 7.4 12/23/2011 1525   ALBUMIN 3.4 (L) 01/19/2020 1607   ALBUMIN 3.8 12/23/2011 1525   AST 16 01/19/2020 1607   AST 21 12/23/2011 1525   ALT 9 01/19/2020 1607   ALT 17 12/23/2011 1525   ALKPHOS 102 01/19/2020 1607   ALKPHOS 57 12/23/2011 1525   BILITOT 1.0 01/19/2020 1607   BILITOT 0.5 12/23/2011 1525   GFRNONAA >60 01/20/2020 0355   GFRNONAA 72 05/05/2019 1408   GFRAA >60 12/17/2019 0829   GFRAA 83 05/05/2019 1408     No results found.     Assessment & Plan:   1. Deep vein thrombosis (DVT) of femoral vein of left lower extremity, unspecified chronicity (HCC) Currently the patient is on Eliquis and will continue Eliquis for 3 months with the plan to end on 02/18/2020.  Given the fact that this DVT was likely provoked as well as the patient's age, 3 months should be prudent.  We will have the patient return to the office in 6 months for evaluation of lower extremity edema.  Patient is advised to contact office if issues should arise sooner.  2. Mixed hyperlipidemia Continue  statin as ordered and reviewed, no changes at this time    Current Outpatient Medications  on File Prior to Visit  Medication Sig Dispense Refill  . acetaminophen (TYLENOL) 325 MG tablet Take 1-2 tablets (325-650 mg total) by mouth every 6 (six) hours as needed for mild pain (pain score 1-3 or temp > 100.5).    Marland Kitchen apixaban (ELIQUIS) 5 MG TABS tablet Take 2 tablets (10 mg total) by mouth 2 (two) times daily. And then from 01/29/2020 take 1 tab (5 mg ) two times daily 60 tablet 3  . Calcium 600-200 MG-UNIT tablet Take 1 tablet by mouth daily.    Janann August 75 MCG tablet TAKE 1 TABLET BY MOUTH ONCE DAILY BEFORE BREAKFAST 90 tablet 3  . furosemide (LASIX) 20 MG tablet Take 20 mg by mouth daily.    . metoprolol succinate (TOPROL-XL) 25 MG 24 hr tablet TAKE 1 TABLET BY MOUTH IN THE MORNING AND 2 NIGHTLY 270 tablet 1  . memantine (NAMENDA) 10 MG tablet Take 1 tablet by mouth twice daily (Patient not taking: Reported on 02/02/2020) 180 tablet 1   No current facility-administered medications on file prior to visit.    There are no Patient Instructions on file for this visit. No follow-ups on file.   Georgiana Spinner, NP

## 2020-02-18 ENCOUNTER — Other Ambulatory Visit: Payer: Self-pay | Admitting: Internal Medicine

## 2020-02-18 DIAGNOSIS — G301 Alzheimer's disease with late onset: Secondary | ICD-10-CM

## 2020-02-18 DIAGNOSIS — F0280 Dementia in other diseases classified elsewhere without behavioral disturbance: Secondary | ICD-10-CM

## 2020-02-29 NOTE — Progress Notes (Signed)
Patient ID: Deborah SchleinJeraldine N Mack, female    DOB: November 03, 1927, 86 y.o.   MRN: [id number]  PCP: Jamelle HaringHendrickson, Jetaime Pinnix D, MD  Chief Complaint  Patient presents with  . Follow-up    Subjective:   [name] is a 86 y.o. female, presents to clinic with CC of the following:  Chief Complaint  Patient presents with  . Follow-up    HPI:  Patient is an 86 year old female Last visit with me was 01/24/2020 after hospital discharge Follows up today.  Her daughter was with her today who is very helpful with the history.  They note that all in all things have been going fairly well. Over the last few weeks, the patient denies any pain  The patient notes the leg continues to be swollen, left mostly. Remains improved. Symptoms are much better with elevation.  The patient notes minimal edema in the morning which steadily worsens throughout the day.   The patient has been using compression stockings which seem to help. No SOB or pleuritic chest pains.  No cough, no fevers. No bleeding concerns.  No excessive bruising per the patient.   To review,  she was admitted to the hospital with a right hip fracture 9/28/202 Surgery was performed by Dr. Joice LoftsPoggi on 12/13/2019.  The patient was at a rehab facility following her surgery. The patientwastaking Lovenox daily for DVT prophylaxis after the surgery. follow-up with surgery was 12/25/2019, with the Lovenox recommended to be continued for 1 more week.And a follow-up plan for 4 weeks. She was receiving PT as an outpatient before most recent hospitalization for the significant blood clot concern.  On her follow-up visit after discharge, she was doing okay. The daughter noted she was more calm at home. They decided not to have the thrombectomy procedure and she was discharged taking Eliquis.  The lower extremity swelling did lessen some, still more significant on the left versus the right.    She had a follow-up with orthopedic  surgery 01/26/2020 with the following assessment/plan noted:  Impression: Status post open reduction and internal fixation (ORIF) of fracture [Z98.890, Z87.81] Status post open reduction and internal fixation (ORIF) of fracture (primary encounter diagnosis) Closed displaced subtrochanteric fracture of right femur, sequela  Plan:  1. Treatment options were discussed today with the patient. 2. Continue with weightbearing activities to the right lower extremity. Continues a walker for assistance with ambulation. 3. Continue with Eliquis due to recent left lower extremity DVT. 4. X-rays of the right femur demonstrate increased callus formation however there is not significant callus formation at this time. 5. The patient will follow-up with me in 6 weeks for repeat x-rays of the right femur. They can call the clinic they have any questions, new symptoms develop or symptoms worsen.  She had follow-up with vascular surgery on 02/02/2020 with the following assessment/plan noted:  1. Deep vein thrombosis (DVT) of femoral vein of left lower extremity, unspecified chronicity (HCC) Currently the patient is on Eliquis and will continue Eliquis for 3 months with the plan to end on 02/18/2020.  Given the fact that this DVT was likely provoked as well as the patient's age, 3 months should be prudent.  We will have the patient return to the office in 6 months for evaluation of lower extremity edema.  Patient is advised to contact office if issues should arise sooner. There was no evidence of DVT in the left lower extremity seen on a duplex done at that visit.  2. Mixed hyperlipidemia Continue statin as ordered and reviewed, no changes at this time   She has a significant cardiac history, andshelastfollowed up with cardiology on9/28/2021with their assessment and plan as follows: 85 y.o. female with  1. Nonrheumatic aortic valve insufficiency  2. Essential hypertension  3. Dilated cardiomyopathy  (CMS-HCC)  4. Late onset Alzheimer's dementia without behavioral disturbance (CMS-HCC)  5. Hyperlipidemia, unspecified hyperlipidemia type   86 year old female with a history of hypertension, hyperlipidemia, Alzheimer's dementia, and moderate aortic insufficiency. Recent 2D echocardiogram reveals moderately reduced left ventricular function of 25-30% with inferior and septal wall akinesis, compared to prior echocardiogram in 2016 which revealed normal LV function without wall motion abnormalities. The patient is currently asymptomatic, denying chest pain, shortness of breath, palpitations, peripheral edema, or syncope. Patient has essential hypertension, systolic blood pressure mildly elevated today on metoprolol. We discussed with the patient's son the option for evaluating her reduced left ventricular function, including nuclear stress testing versus taking a conservative approach and medically managing. We suggested pursuing a conservative approach as the patient is elderly, has dementia, and is asymptomatic.  Plan   1. Continue current medications 2. Counseled patient about low-sodium diet 3. DASH diet printed instructions given to the patient 4. Counseled patient about low-cholesterol diet  5. Heart healthy diet printed instructions given to the patient 6. Conservative management in light of patient's advanced age and baseline dementia 7. Return to clinic for follow-up in 6 months  Return in about 6 months (around 06/10/2020).   Her last echocardiogramwas performed on 04/01/2018, which revealed LVEF 25-30% with moderate aortic and mitral regurgitation, mild tricuspid and pulmonic regurgitation, moderate left atrial enlargement, and mild pulmonary hypertension. This LV function was significantly reduced from the previous echocardiogram in 2016, which revealed LVEF greater than 55%.    HTN-medication regimen-metoprolol and Lasix,  Taking medications regularly BP checks at home  -none BP Readings from Last 3 Encounters:  03/05/20 (!) 144/40  02/02/20 (!) 147/74  01/24/20 (!) 112/50   Denies any chest pains, palpitations, increased shortness of breath.  Hyperlipidemia-remains on crestor- 5mg , tolerating to date Lab Results  Component Value Date   CHOL 189 02/01/2018   HDL 84 02/01/2018   LDLCALC 86 02/01/2018   TRIG 97 02/01/2018   CHOLHDL 2.3 02/01/2018     Dementia:Her history is remarkable forpredominantly short term memory loss, with the daughter noting things have progressed here in the last couple months.  She notes she has continued to be able to manage at home presently. She has support from her family, She is taking namenda.  Shehasfamily with her at home   Hypothyroidism:Last TSH was good. Remains on thyroid supplement once daily Lab Results  Component Value Date   TSH 1.68 05/05/2019     Osteopenia: Last BMD 08/2016. She is taking vitamin D supplement  Last Vit D level was ok. (43 in 06/2016) No h/o pathologic fractures.   Glaucoma:Continues to seeDr. 07/2016 and managed with drops presently  Tob - never smoker   Patient Active Problem List   Diagnosis Date Noted  . History of fracture of right hip 01/24/2020  . DVT of deep femoral vein, left (HCC)   . Left leg DVT (HCC) 01/19/2020  . Pressure injury of skin 01/19/2020  . Closed right hip fracture (HCC) 12/12/2019  . Fall 12/12/2019  . Preoperative clearance 12/12/2019  . Chronic systolic heart failure (HCC) 10/12/2019  . At risk for fall due to comorbid condition 10/12/2019  . Senile purpura (HCC) 08/31/2019  .  Dilated cardiomyopathy (HCC) 05/03/2019  . Osteopenia 06/22/2017  . HTN (hypertension) 02/10/2017  . Dementia of the Alzheimer's type without behavioral disturbance (HCC) 11/04/2015  . Gradual-onset memory impairment 10/25/2015  . Hypothyroidism 05/07/2015  . Hyperlipidemia 05/07/2015  . Hyperglycemia 05/07/2015  . Aortic insufficiency 06/14/2013       Current Outpatient Medications:  .  acetaminophen (TYLENOL) 325 MG tablet, Take 1-2 tablets (325-650 mg total) by mouth every 6 (six) hours as needed for mild pain (pain score 1-3 or temp > 100.5)., Disp: , Rfl:  .  apixaban (ELIQUIS) 5 MG TABS tablet, Take 2 tablets (10 mg total) by mouth 2 (two) times daily. And then from 01/29/2020 take 1 tab (5 mg ) two times daily, Disp: 60 tablet, Rfl: 3 .  Calcium 600-200 MG-UNIT tablet, Take 1 tablet by mouth daily., Disp: , Rfl:  .  EUTHYROX 75 MCG tablet, TAKE 1 TABLET BY MOUTH ONCE DAILY BEFORE BREAKFAST, Disp: 90 tablet, Rfl: 3 .  furosemide (LASIX) 20 MG tablet, Take 20 mg by mouth daily., Disp: , Rfl:  .  memantine (NAMENDA) 10 MG tablet, Take 1 tablet by mouth twice daily, Disp: 180 tablet, Rfl: 0 .  metoprolol succinate (TOPROL-XL) 25 MG 24 hr tablet, TAKE 1 TABLET BY MOUTH IN THE MORNING AND 2 NIGHTLY, Disp: 270 tablet, Rfl: 1   No Known Allergies   Past Surgical History:  Procedure Laterality Date  . INTRAMEDULLARY (IM) NAIL INTERTROCHANTERIC Right 12/13/2019   Procedure: INTRAMEDULLARY (IM) NAIL INTERTROCHANTRIC;  Surgeon: Christena Flake, MD;  Location: [facility] ORS;  Service: Orthopedics;  Laterality: Right;  . NO PAST SURGERIES       Family History  Problem Relation Age of Onset  . Cancer Father   . Hyperlipidemia Sister   . Heart disease Brother   . Stroke Neg Hx      Social History   Tobacco Use  . Smoking status: Never Smoker  . Smokeless tobacco: Never Used  . Tobacco comment: smoking cessation materials not required  Substance Use Topics  . Alcohol use: No    Alcohol/week: 0.0 standard drinks    With staff assistance, above reviewed with the patient/caregiver today.  ROS: As per HPI, otherwise no specific complaints on a limited and focused system review   No results found for this or any previous visit (from the past 72 hour(s)).   PHQ2/9: Depression screen [facility] 2/9 03/05/2020 01/24/2020 01/19/2020  08/31/2019 05/05/2019  Decreased Interest 2 0 0 0 0  Down, Depressed, Hopeless 2 0 0 0 0  PHQ - 2 Score 4 0 0 0 0  Altered sleeping 1 1 1  0 0  Tired, decreased energy 1 1 1  0 0  Change in appetite 3 3 3  0 0  Feeling bad or failure about yourself  0 0 0 0 -  Trouble concentrating 3 3 3  0 0  Moving slowly or fidgety/restless 0 0 0 0 0  Suicidal thoughts 0 0 0 0 0  PHQ-9 Score 12 8 8  0 0  Difficult doing work/chores Not difficult at all Not difficult at all Not difficult at all Not difficult at all Not difficult at all  Some recent data might be hidden   PHQ-2/9 Result rvewiewed  Fall Risk: Fall Risk  03/05/2020 01/24/2020 01/19/2020 10/12/2019 08/31/2019  Falls in the past year? 1 1 1  0 0  Number falls in past yr: 0 0 0 0 0  Injury with Fall? 1 1 1  0 0  Risk for  fall due to : - - History of fall(s);Impaired balance/gait;Impaired mobility - -  Risk for fall due to: Comment - - - - -  Follow up - - - - -      Objective:   Vitals:   03/05/20 1037  BP: (!) 144/40  Pulse: (!) 52  Resp: 16  Temp: 97.7 F (36.5 C)  TempSrc: Oral  SpO2: 99%  Weight: 134 lb (60.8 kg)  Height:  (1.626 m)    Body mass index is 23 kg/m. Recheck of blood pressure was approximately 136/44 on the left by me.  Physical Exam  NAD, masked, pleasant,patient in a wheelchair in the office and examined in the wheelchair. HEENT - [place]/AT, sclera anicteric,+ glasses, conj - non-inj'ed, Neck - supple, no adenopathy Car - RRR with1-II/6 systolic murmur present,not new, no S3 Pulm- RR and effort normal at rest, CTA without wheeze or rales Ext -compression stockings in place bilaterally, 1-2+ lower extremity edema more diffusely noted from below the knee down to the ankle on the left, trace edema on the right, no marked discomfort with palpation diffusely,  no cords Neuro/psychiatric - affect was not flat,  Alert, seems to answers questions appropriately,  Does have diminished hearing.  At good  grip strength, no problem with foot movements bilaterally,  Results for orders placed or performed during the hospital encounter of 01/19/20  Respiratory Panel by RT PCR (Flu A&B, Covid) - Nasopharyngeal Swab   Specimen: Nasopharyngeal Swab  Result Value Ref Range   SARS Coronavirus 2 by RT PCR NEGATIVE NEGATIVE   Influenza A by PCR NEGATIVE NEGATIVE   Influenza B by PCR NEGATIVE NEGATIVE  CBC with Differential  Result Value Ref Range   WBC 8.2 4.0 - 10.5 K/uL   RBC 3.72 (L) 3.87 - 5.11 MIL/uL   Hemoglobin 11.5 (L) 12.0 - 15.0 g/dL   HCT 81.1 (L) 91.4 - 78.2 %   MCV 94.9 80.0 - 100.0 fL   MCH 30.9 26.0 - 34.0 pg   MCHC 32.6 30.0 - 36.0 g/dL   RDW 95.6 21.3 - 08.6 %   Platelets 305 150 - 400 K/uL   nRBC 0.0 0.0 - 0.2 %   Neutrophils Relative % 65 %   Neutro Abs 5.4 1.7 - 7.7 K/uL   Lymphocytes Relative 19 %   Lymphs Abs 1.5 0.7 - 4.0 K/uL   Monocytes Relative 10 %   Monocytes Absolute 0.8 0.1 - 1.0 K/uL   Eosinophils Relative 4 %   Eosinophils Absolute 0.3 0.0 - 0.5 K/uL   Basophils Relative 1 %   Basophils Absolute 0.1 0.0 - 0.1 K/uL   Immature Granulocytes 1 %   Abs Immature Granulocytes 0.05 0.00 - 0.07 K/uL  Comprehensive metabolic panel  Result Value Ref Range   Sodium 129 (L) 135 - 145 mmol/L   Potassium 3.8 3.5 - 5.1 mmol/L   Chloride 93 (L) 98 - 111 mmol/L   CO2 27 22 - 32 mmol/L   Glucose, Bld 120 (H) 70 - 99 mg/dL   BUN 8 8 - 23 mg/dL   Creatinine, Ser 5.78 0.44 - 1.00 mg/dL   Calcium 9.4 8.9 - 46.9 mg/dL   Total Protein 7.3 6.5 - 8.1 g/dL   Albumin 3.4 (L) 3.5 - 5.0 g/dL   AST 16 15 - 41 U/L   ALT 9 0 - 44 U/L   Alkaline Phosphatase 102 38 - 126 U/L   Total Bilirubin 1.0 0.3 - 1.2 mg/dL  GFR, Estimated >60 >60 mL/min   Anion gap 9 5 - 15  Basic metabolic panel  Result Value Ref Range   Sodium 135 135 - 145 mmol/L   Potassium 3.5 3.5 - 5.1 mmol/L   Chloride 101 98 - 111 mmol/L   CO2 26 22 - 32 mmol/L   Glucose, Bld 114 (H) 70 - 99 mg/dL   BUN 7 (L)  8 - 23 mg/dL   Creatinine, Ser 0.98 0.44 - 1.00 mg/dL   Calcium 8.7 (L) 8.9 - 10.3 mg/dL   GFR, Estimated >11 >91 mL/min   Anion gap 8 5 - 15  CBC  Result Value Ref Range   WBC 6.9 4.0 - 10.5 K/uL   RBC 3.21 (L) 3.87 - 5.11 MIL/uL   Hemoglobin 10.0 (L) 12.0 - 15.0 g/dL   HCT 47.8 (L) 29.5 - 62.1 %   MCV 94.4 80.0 - 100.0 fL   MCH 31.2 26.0 - 34.0 pg   MCHC 33.0 30.0 - 36.0 g/dL   RDW 30.8 65.7 - 84.6 %   Platelets 255 150 - 400 K/uL   nRBC 0.0 0.0 - 0.2 %  Protime-INR  Result Value Ref Range   Prothrombin Time 13.9 11.4 - 15.2 seconds   INR 1.1 0.8 - 1.2  APTT  Result Value Ref Range   aPTT >160 (HH) 24 - 36 seconds  Heparin level (unfractionated)  Result Value Ref Range   Heparin Unfractionated 0.75 (H) 0.30 - 0.70 IU/mL  Heparin level (unfractionated)  Result Value Ref Range   Heparin Unfractionated 0.75 (H) 0.30 - 0.70 IU/mL  Heparin level (unfractionated)  Result Value Ref Range   Heparin Unfractionated 0.60 0.30 - 0.70 IU/mL  Heparin level (unfractionated)  Result Value Ref Range   Heparin Unfractionated 0.71 (H) 0.30 - 0.70 IU/mL  CBC  Result Value Ref Range   WBC 6.3 4.0 - 10.5 K/uL   RBC 3.28 (L) 3.87 - 5.11 MIL/uL   Hemoglobin 10.0 (L) 12.0 - 15.0 g/dL   HCT 96.2 (L) 95.2 - 84.1 %   MCV 93.3 80.0 - 100.0 fL   MCH 30.5 26.0 - 34.0 pg   MCHC 32.7 30.0 - 36.0 g/dL   RDW 32.4 40.1 - 02.7 %   Platelets 264 150 - 400 K/uL   nRBC 0.0 0.0 - 0.2 %  Heparin level (unfractionated)  Result Value Ref Range   Heparin Unfractionated 0.35 0.30 - 0.70 IU/mL  Heparin level (unfractionated)  Result Value Ref Range   Heparin Unfractionated <0.10 (L) 0.30 - 0.70 IU/mL  CBC  Result Value Ref Range   WBC 7.0 4.0 - 10.5 K/uL   RBC 3.41 (L) 3.87 - 5.11 MIL/uL   Hemoglobin 10.5 (L) 12.0 - 15.0 g/dL   HCT 25.3 (L) 66.4 - 40.3 %   MCV 95.9 80.0 - 100.0 fL   MCH 30.8 26.0 - 34.0 pg   MCHC 32.1 30.0 - 36.0 g/dL   RDW 47.4 25.9 - 56.3 %   Platelets 285 150 - 400 K/uL   nRBC  0.0 0.0 - 0.2 %  Heparin level (unfractionated)  Result Value Ref Range   Heparin Unfractionated 0.40 0.30 - 0.70 IU/mL   Prior labs reviewed.    Assessment & Plan:     1. Hyperlipidemia Continue low-dose statin presently  2. Acute deep vein thrombosis (DVT) of proximal vein of left lower extremity (HCC) Patient is currently on Eliquis, with the thrombectomy offered while hospitalized not pursued.   She still  has swelling more on the left lower extremity than right, although has improved some since hospitalized and treatment initiated.  Continue the Eliquis presently, with plans to end after 3 months as per vascular surgery recommendation above Discussed the importance of fall prevention, and the daughter is with her 24/7 presently and aware of this importance. Still trying to elevate the leg is most possible, as can be helpful.  Also continues the compression stockings.   3. History of fracture of right hip She did have surgery prior to this most recent hospitalization with the blood clot concern.  Continue with home health involvement and therapy to help after that prior fracture and surgical procedure and continued follow-up with orthopedic surgery as planned   4. Alzheimer's dementia without behavioral disturbance, unspecified timing of dementia onset (HCC) Continue current management, including the medication Namenda She does have good support and supervision in the present home environment with family very supportive and helpful presently.  5. Nonrheumatic aortic valve insufficiency 6. Chronic systolic heart failure (HCC) 7. Essential hypertension Blood pressures have been fairly well controlled, and continues to follow with cardiology as well to help. Next follow-up with cardiology is in March and continue that follow-up as planned  8.  Hypothyroid Remains on the thyroid supplement She is due for recheck of the TSH in February, and feel reasonable to check further  labs on that follow-up visit.   We will schedule follow-up in about 10 weeks time, sooner as needed, with plans to check labs again on that follow-up visit. She is aware that the follow-up will be with a new provider as I will be  leaving the practice prior to that planned follow-up     Jamelle Haring, MD 03/05/20 10:41 AM

## 2020-03-05 ENCOUNTER — Encounter: Payer: Self-pay | Admitting: Internal Medicine

## 2020-03-05 ENCOUNTER — Ambulatory Visit (INDEPENDENT_AMBULATORY_CARE_PROVIDER_SITE_OTHER): Payer: Medicare Other | Admitting: Internal Medicine

## 2020-03-05 ENCOUNTER — Other Ambulatory Visit: Payer: Self-pay

## 2020-03-05 VITALS — BP 144/40 | HR 52 | Temp 97.7°F | Resp 16 | Ht 64.0 in | Wt 134.0 lb

## 2020-03-05 DIAGNOSIS — G309 Alzheimer's disease, unspecified: Secondary | ICD-10-CM | POA: Diagnosis not present

## 2020-03-05 DIAGNOSIS — E782 Mixed hyperlipidemia: Secondary | ICD-10-CM

## 2020-03-05 DIAGNOSIS — Z8781 Personal history of (healed) traumatic fracture: Secondary | ICD-10-CM | POA: Diagnosis not present

## 2020-03-05 DIAGNOSIS — I10 Essential (primary) hypertension: Secondary | ICD-10-CM

## 2020-03-05 DIAGNOSIS — E039 Hypothyroidism, unspecified: Secondary | ICD-10-CM

## 2020-03-05 DIAGNOSIS — I5022 Chronic systolic (congestive) heart failure: Secondary | ICD-10-CM

## 2020-03-05 DIAGNOSIS — F0280 Dementia in other diseases classified elsewhere without behavioral disturbance: Secondary | ICD-10-CM

## 2020-03-05 DIAGNOSIS — I351 Nonrheumatic aortic (valve) insufficiency: Secondary | ICD-10-CM

## 2020-03-05 DIAGNOSIS — I824Y2 Acute embolism and thrombosis of unspecified deep veins of left proximal lower extremity: Secondary | ICD-10-CM | POA: Diagnosis not present

## 2020-03-05 DIAGNOSIS — M7989 Other specified soft tissue disorders: Secondary | ICD-10-CM

## 2020-04-02 ENCOUNTER — Ambulatory Visit (INDEPENDENT_AMBULATORY_CARE_PROVIDER_SITE_OTHER): Payer: Medicare Other

## 2020-04-02 DIAGNOSIS — Z0000 Encounter for general adult medical examination without abnormal findings: Secondary | ICD-10-CM

## 2020-04-02 NOTE — Patient Instructions (Signed)
[name] , Thank you for taking time to come for your Medicare Wellness Visit. I appreciate your ongoing commitment to your health goals. Please review the following plan we discussed and let me know if I can assist you in the future.   Screening recommendations/referrals: Colonoscopy: no longer required Mammogram: no longer required Bone Density: no longer required Recommended yearly ophthalmology/optometry visit for glaucoma screening and checkup Recommended yearly dental visit for hygiene and checkup  Vaccinations: Influenza vaccine: done 01/21/20 Pneumococcal vaccine: done 06/22/17 Tdap vaccine: due Shingles vaccine: Shingrix discussed. Please contact your pharmacy for coverage information.  Covid-19: done 11/25/19 & 12/18/19  Conditions/risks identified: Recommend continuing fall prevention in the home and encouraging healthy diet  Next appointment: Follow up in one year for your annual wellness visit    Preventive Care 65 Years and Older, Female Preventive care refers to lifestyle choices and visits with your health care provider that can promote health and wellness. What does preventive care include?  A yearly physical exam. This is also called an annual well check.  Dental exams once or twice a year.  Routine eye exams. Ask your health care provider how often you should have your eyes checked.  Personal lifestyle choices, including:  Daily care of your teeth and gums.  Regular physical activity.  Eating a healthy diet.  Avoiding tobacco and drug use.  Limiting alcohol use.  Practicing safe sex.  Taking low-dose aspirin every day.  Taking vitamin and mineral supplements as recommended by your health care provider. What happens during an annual well check? The services and screenings done by your health care provider during your annual well check will depend on your age, overall health, lifestyle risk factors, and family history of disease. Counseling  Your  health care provider may ask you questions about your:  Alcohol use.  Tobacco use.  Drug use.  Emotional well-being.  Home and relationship well-being.  Sexual activity.  Eating habits.  History of falls.  Memory and ability to understand (cognition).  Work and work Astronomer.  Reproductive health. Screening  You may have the following tests or measurements:  Height, weight, and BMI.  Blood pressure.  Lipid and cholesterol levels. These may be checked every 5 years, or more frequently if you are over 91 years old.  Skin check.  Lung cancer screening. You may have this screening every year starting at age 78 if you have a 30-pack-year history of smoking and currently smoke or have quit within the past 15 years.  Fecal occult blood test (FOBT) of the stool. You may have this test every year starting at age 50.  Flexible sigmoidoscopy or colonoscopy. You may have a sigmoidoscopy every 5 years or a colonoscopy every 10 years starting at age 67.  Hepatitis C blood test.  Hepatitis B blood test.  Sexually transmitted disease (STD) testing.  Diabetes screening. This is done by checking your blood sugar (glucose) after you have not eaten for a while (fasting). You may have this done every 1-3 years.  Bone density scan. This is done to screen for osteoporosis. You may have this done starting at age 43.  Mammogram. This may be done every 1-2 years. Talk to your health care provider about how often you should have regular mammograms. Talk with your health care provider about your test results, treatment options, and if necessary, the need for more tests. Vaccines  Your health care provider may recommend certain vaccines, such as:  Influenza vaccine. This is recommended every  year.  Tetanus, diphtheria, and acellular pertussis (Tdap, Td) vaccine. You may need a Td booster every 10 years.  Zoster vaccine. You may need this after age 20.  Pneumococcal 13-valent  conjugate (PCV13) vaccine. One dose is recommended after age 63.  Pneumococcal polysaccharide (PPSV23) vaccine. One dose is recommended after age 62. Talk to your health care provider about which screenings and vaccines you need and how often you need them. This information is not intended to replace advice given to you by your health care provider. Make sure you discuss any questions you have with your health care provider. Document Released: 03/29/2015 Document Revised: 11/20/2015 Document Reviewed: 01/01/2015 Elsevier Interactive Patient Education  2017 [place] Prevention in the Home Falls can cause injuries. They can happen to people of all ages. There are many things you can do to make your home safe and to help prevent falls. What can I do on the outside of my home?  Regularly fix the edges of walkways and driveways and fix any cracks.  Remove anything that might make you trip as you walk through a door, such as a raised step or threshold.  Trim any bushes or trees on the path to your home.  Use bright outdoor lighting.  Clear any walking paths of anything that might make someone trip, such as rocks or tools.  Regularly check to see if handrails are loose or broken. Make sure that both sides of any steps have handrails.  Any raised decks and porches should have guardrails on the edges.  Have any leaves, snow, or ice cleared regularly.  Use sand or salt on walking paths during winter.  Clean up any spills in your garage right away. This includes oil or grease spills. What can I do in the bathroom?  Use night lights.  Install grab bars by the toilet and in the tub and shower. Do not use towel bars as grab bars.  Use non-skid mats or decals in the tub or shower.  If you need to sit down in the shower, use a plastic, non-slip stool.  Keep the floor dry. Clean up any water that spills on the floor as soon as it happens.  Remove soap buildup in the tub or  shower regularly.  Attach bath mats securely with double-sided non-slip rug tape.  Do not have throw rugs and other things on the floor that can make you trip. What can I do in the bedroom?  Use night lights.  Make sure that you have a light by your bed that is easy to reach.  Do not use any sheets or blankets that are too big for your bed. They should not hang down onto the floor.  Have a firm chair that has side arms. You can use this for support while you get dressed.  Do not have throw rugs and other things on the floor that can make you trip. What can I do in the kitchen?  Clean up any spills right away.  Avoid walking on wet floors.  Keep items that you use a lot in easy-to-reach places.  If you need to reach something above you, use a strong step stool that has a grab bar.  Keep electrical cords out of the way.  Do not use floor polish or wax that makes floors slippery. If you must use wax, use non-skid floor wax.  Do not have throw rugs and other things on the floor that can make you trip. What can  I do with my stairs?  Do not leave any items on the stairs.  Make sure that there are handrails on both sides of the stairs and use them. Fix handrails that are broken or loose. Make sure that handrails are as long as the stairways.  Check any carpeting to make sure that it is firmly attached to the stairs. Fix any carpet that is loose or worn.  Avoid having throw rugs at the top or bottom of the stairs. If you do have throw rugs, attach them to the floor with carpet tape.  Make sure that you have a light switch at the top of the stairs and the bottom of the stairs. If you do not have them, ask someone to add them for you. What else can I do to help prevent falls?  Wear shoes that:  Do not have high heels.  Have rubber bottoms.  Are comfortable and fit you well.  Are closed at the toe. Do not wear sandals.  If you use a stepladder:  Make sure that it is fully  opened. Do not climb a closed stepladder.  Make sure that both sides of the stepladder are locked into place.  Ask someone to hold it for you, if possible.  Clearly mark and make sure that you can see:  Any grab bars or handrails.  First and last steps.  Where the edge of each step is.  Use tools that help you move around (mobility aids) if they are needed. These include:  Canes.  Walkers.  Scooters.  Crutches.  Turn on the lights when you go into a dark area. Replace any light bulbs as soon as they burn out.  Set up your furniture so you have a clear path. Avoid moving your furniture around.  If any of your floors are uneven, fix them.  If there are any pets around you, be aware of where they are.  Review your medicines with your doctor. Some medicines can make you feel dizzy. This can increase your chance of falling. Ask your doctor what other things that you can do to help prevent falls. This information is not intended to replace advice given to you by your health care provider. Make sure you discuss any questions you have with your health care provider. Document Released: 12/27/2008 Document Revised: 08/08/2015 Document Reviewed: 04/06/2014 Elsevier Interactive Patient Education  2017 [employer].

## 2020-04-02 NOTE — Progress Notes (Signed)
Subjective:   [name] is a 87 y.o. female who presents for Medicare Annual (Subsequent) preventive examination.  Virtual Visit via Telephone Note  I connected with  Ewing Schlein on 04/02/20 at  3:30 PM EST by telephone and verified that I am speaking with the correct person using two identifiers.  Location: Patient: home Provider: [facility] Persons participating in the virtual visit: patient & daughter Kathie Rhodes [facility] Health Advisor   I discussed the limitations, risks, security and privacy concerns of performing an evaluation and management service by telephone and the availability of in person appointments. The patient expressed understanding and agreed to proceed.  Interactive audio and video telecommunications were attempted between this nurse and patient, however failed, due to patient having technical difficulties OR patient did not have access to video capability.  We continued and completed visit with audio only.  Some vital signs may be absent or patient reported.   Reather Littler, LPN    Review of Systems     Cardiac Risk Factors include: advanced age (>34men, >18 women);hypertension;sedentary lifestyle;dyslipidemia     Objective:    There were no vitals filed for this visit. There is no height or weight on file to calculate BMI.  Advanced Directives 04/02/2020 01/19/2020 12/16/2019 12/13/2019 12/12/2019 02/26/2019 10/21/2018  Does Patient Have a Medical Advance Directive? Yes Yes Yes Yes Yes Yes Yes  Type of Estate agent of [place];Living will Living will;Healthcare Power of Attorney Living will Living will Living will Living will Healthcare Power of [place];Living will  Does patient want to make changes to medical advance directive? - No - Patient declined No - Patient declined - - No - Patient declined No - Patient declined  Copy of Healthcare Power of Attorney in Chart? Yes - validated most recent copy scanned in chart (See row  information) - - - - - Yes - validated most recent copy scanned in chart (See row information)  Would patient like information on creating a medical advance directive? - - - - - No - Patient declined -    Current Medications (verified) Outpatient Encounter Medications as of 04/02/2020  Medication Sig  . acetaminophen (TYLENOL) 325 MG tablet Take 1-2 tablets (325-650 mg total) by mouth every 6 (six) hours as needed for mild pain (pain score 1-3 or temp > 100.5).  Marland Kitchen apixaban (ELIQUIS) 5 MG TABS tablet Take 2 tablets (10 mg total) by mouth 2 (two) times daily. And then from 01/29/2020 take 1 tab (5 mg ) two times daily (Patient taking differently: Take 5 mg by mouth 2 (two) times daily. And then from 01/29/2020 take 1 tab (5 mg ) two times daily)  . Calcium 600-200 MG-UNIT tablet Take 1 tablet by mouth daily.  . EUTHYROX 75 MCG tablet TAKE 1 TABLET BY MOUTH ONCE DAILY BEFORE BREAKFAST  . furosemide (LASIX) 20 MG tablet Take 20 mg by mouth daily.  . memantine (NAMENDA) 10 MG tablet Take 1 tablet by mouth twice daily  . metoprolol succinate (TOPROL-XL) 25 MG 24 hr tablet TAKE 1 TABLET BY MOUTH IN THE MORNING AND 2 NIGHTLY   No facility-administered encounter medications on file as of 04/02/2020.    Allergies (verified) Patient has no known allergies.   History: Past Medical History:  Diagnosis Date  . Dementia (HCC)   . Glaucoma   . History of blood clots   . HTN (hypertension) 02/10/2017  . Osteoporosis   . Thyroid disease    Past Surgical History:  Procedure  Laterality Date  . INTRAMEDULLARY (IM) NAIL INTERTROCHANTERIC Right 12/13/2019   Procedure: INTRAMEDULLARY (IM) NAIL INTERTROCHANTRIC;  Surgeon: Christena Flake, MD;  Location: [facility] ORS;  Service: Orthopedics;  Laterality: Right;   Family History  Problem Relation Age of Onset  . Cancer Father   . Hyperlipidemia Sister   . Heart disease Brother   . Stroke Neg Hx    Social History   Socioeconomic History  . Marital status:  Widowed    Spouse name: Not on file  . Number of children: 6  . Years of education: Not on file  . Highest education level: 10th grade  Occupational History    Employer: RETIRED  Tobacco Use  . Smoking status: Never Smoker  . Smokeless tobacco: Never Used  . Tobacco comment: smoking cessation materials not required  Vaping Use  . Vaping Use: Never used  Substance and Sexual Activity  . Alcohol use: No    Alcohol/week: 0.0 standard drinks  . Drug use: No  . Sexual activity: Not Currently  Other Topics Concern  . Not on file  Social History Narrative   Pt's daughter Kathie Rhodes lives with her   Social Determinants of Health   Financial Resource Strain: Low Risk   . Difficulty of Paying Living Expenses: Not hard at all  Food Insecurity: No Food Insecurity  . Worried About Programme researcher, broadcasting/film/video in the Last Year: Never true  . Ran Out of Food in the Last Year: Never true  Transportation Needs: No Transportation Needs  . Lack of Transportation (Medical): No  . Lack of Transportation (Non-Medical): No  Physical Activity: Inactive  . Days of Exercise per Week: 0 days  . Minutes of Exercise per Session: 0 min  Stress: No Stress Concern Present  . Feeling of Stress : Not at all  Social Connections: Unknown  . Frequency of Communication with Friends and Family: Patient refused  . Frequency of Social Gatherings with Friends and Family: Patient refused  . Attends Religious Services: Patient refused  . Active Member of Clubs or Organizations: Patient refused  . Attends Banker Meetings: Patient refused  . Marital Status: Widowed    Tobacco Counseling Counseling given: Not Answered Comment: smoking cessation materials not required   Clinical Intake:  Pre-visit preparation completed: Yes  Pain : No/denies pain     Nutritional Risks: None Diabetes: No  How often do you need to have someone help you when you read instructions, pamphlets, or other written materials  from your doctor or pharmacy?: 1 - Never    Interpreter Needed?: No  Information entered by :: Reather Littler LPN   Activities of Daily Living In your present state of health, do you have any difficulty performing the following activities: 04/02/2020 03/05/2020  Hearing? Y Y  Comment declines hearing aids -  Vision? N N  Difficulty concentrating or making decisions? Malvin Johns  Walking or climbing stairs? Y Y  Dressing or bathing? Y Y  Doing errands, shopping? Malvin Johns  Preparing Food and eating ? N -  Using the Toilet? N -  In the past six months, have you accidently leaked urine? Y -  Comment wears pads/depends for protection -  Do you have problems with loss of bowel control? N -  Managing your Medications? Y -  Managing your Finances? Y -  Housekeeping or managing your Housekeeping? Y -  Some recent data might be hidden    Patient Care Team: Jamelle Haring, MD as PCP -  General (Internal Medicine) Irene LimboBell, Phillip T., MD as Consulting Physician (Ophthalmology) Marcina MillardParaschos, Alexander, MD as Consulting Physician (Cardiology)  Indicate any recent Medical Services you may have received from other than [facility] providers in the past year (date may be approximate).     Assessment:   This is a routine wellness examination for Raphaela.  Hearing/Vision screen  Hearing Screening   125Hz  250Hz  500Hz  1000Hz  2000Hz  3000Hz  4000Hz  6000Hz  8000Hz   Right ear:           Left ear:           Comments: Pt has mild hearing difficulty; declines hearing aids   Vision Screening Comments: Annual vision screenings done by Dr. Hulen LusterPhillip Bell  Dietary issues and exercise activities discussed: Current Exercise Habits: The patient does not participate in regular exercise at present, Exercise limited by: orthopedic condition(s)  Goals    .  "I would like some resources to help take care of my mom" completed (pt-stated)      Current Barriers:  . ADL IADL limitations . Cognitive Deficits . Inability to  perform ADL's independently . Inability to perform IADL's independently  Clinical Social Work Clinical Goal(s):  Marland Kitchen. Over the next 30 days, client's daughter will follow up with community resources provided as directed by SW as needed   Interventions: . Provided patient's daughter with information about available community resources for day programs, in home care and educational resources for dementia care and support . Provided patient's daughter with encouragement and support related to care practices already put in place for patient.   Patient Self Care Activities:  . Currently UNABLE TO independently perform ADL's and IADL's  Initial goal documentation     .  DIET - INCREASE WATER INTAKE      Recommend to drink at least 6-8 8oz glasses of water per day.    .  Increase water intake      Recommend increasing water intake to 4-6 glasses a day.    .  per patient's daughter"I think my mom needs more supervison during the day"" (pt-stated)      CARE PLAN ENTRY (see longitudinal plan of care for additional care plan information)  Current Barriers:  . ADL IADL limitations  Clinical Social Work Clinical Goal(s):  Marland Kitchen. Over the next 90 days, patient will work with SW to address concerns related to coordinating additional help in the home  Interventions: . Inter-disciplinary care team collaboration (see longitudinal plan of care) . Continued to discuss need for supervision during the day due to patient's condition progressing . Patient's daughter confirmed that she has spoken with the insurance company to confirm that patient needs custodial care, which private insurance does not cover . Patient's daughter discussed potential plan for patient's sister in law to provide care and supervision for patient daily . Patient's daughter verbalized having no additional community resource needs at this time . Discussed plans with patient's daughter for ongoing care management follow up and provided  patient with direct contact information for care management team if needed in the future   Patient Self Care Activities:  . Attends all scheduled provider appointments . Unable to perform ADLs independently . Unable to perform IADLs independently  Initial goal documentation     .  Prevent falls      Recommend to remove any items from the home that may cause slips or trips.      Depression Screen PHQ 2/9 Scores 04/02/2020 03/05/2020 01/24/2020 01/19/2020 10/12/2019 08/31/2019 05/05/2019  PHQ - 2  Score 2 4 0 0 - 0 0  PHQ- 9 Score 10 12 8 8  - 0 0  Exception Documentation - - - - Medical reason - -    Fall Risk Fall Risk  04/02/2020 03/05/2020 01/24/2020 01/19/2020 10/12/2019  Falls in the past year? 1 1 1 1  0  Number falls in past yr: 1 0 0 0 0  Injury with Fall? 1 1 1 1  0  Risk for fall due to : Impaired balance/gait;Impaired mobility;History of fall(s) - - History of fall(s);Impaired balance/gait;Impaired mobility -  Risk for fall due to: Comment - - - - -  Follow up Falls prevention discussed - - - -    FALL RISK PREVENTION PERTAINING TO THE HOME:  Any stairs in or around the home? Yes  If so, are there any without handrails? No  Home free of loose throw rugs in walkways, pet beds, electrical cords, etc? Yes  Adequate lighting in your home to reduce risk of falls? Yes   ASSISTIVE DEVICES UTILIZED TO PREVENT FALLS:  Life alert? No  Use of a cane, walker or w/c? Yes  Grab bars in the bathroom? Yes  Shower chair or bench in shower? Yes  Elevated toilet seat or a handicapped toilet? No   TIMED UP AND GO:  Was the test performed? No . Telephonic visit.   Cognitive Function: Cognitive status assessed by direct observation. Patient has current diagnosis of cognitive impairment.      6CIT Screen 02/01/2018 10/01/2017 06/22/2017 07/27/2016  What Year? 4 points 4 points 4 points 4 points  What month? 3 points 3 points 0 points 3 points  What time? 0 points 0 points 0 points 0  points  Count back from 20 4 points 4 points 4 points 0 points  Months in reverse 4 points 4 points 4 points 4 points  Repeat phrase 10 points 10 points 10 points 8 points  Total Score 25 25 22 19     Immunizations Immunization History  Administered Date(s) Administered  . Fluad Quad(high Dose 65+) 01/21/2020  . Influenza, High Dose Seasonal PF 12/28/2015, 12/26/2016, 01/05/2018, 12/23/2018  . Influenza-Unspecified 12/26/2016, 01/05/2018, 12/23/2018  . PFIZER(Purple Top)SARS-COV-2 Vaccination 11/25/2019, 12/18/2019  . Pneumococcal Conjugate-13 06/22/2017    TDAP status: Due, Education has been provided regarding the importance of this vaccine. Advised may receive this vaccine at local pharmacy or Health Dept. Aware to provide a copy of the vaccination record if obtained from local pharmacy or Health Dept. Verbalized acceptance and understanding.  Flu Vaccine status: Up to date  Pneumococcal vaccine status: Due, Education has been provided regarding the importance of this vaccine. Advised may receive this vaccine at local pharmacy or Health Dept. Aware to provide a copy of the vaccination record if obtained from local pharmacy or Health Dept. Verbalized acceptance and understanding.  Covid-19 vaccine status: Completed vaccines  Qualifies for Shingles Vaccine? Yes   Zostavax completed No   Shingrix Completed?: No.    Education has been provided regarding the importance of this vaccine. Patient has been advised to call insurance company to determine out of pocket expense if they have not yet received this vaccine. Advised may also receive vaccine at local pharmacy or Health Dept. Verbalized acceptance and understanding.  Screening Tests Health Maintenance  Topic Date Due  . PNA vac Low Risk Adult (2 of 2 - PPSV23) 06/23/2018  . TETANUS/TDAP  03/16/2026 (Originally 02/24/1947)  . COVID-19 Vaccine (3 - Booster for Pfizer series) 06/17/2020  . INFLUENZA VACCINE  Completed  . DEXA SCAN   Completed    Health Maintenance  Health Maintenance Due  Topic Date Due  . PNA vac Low Risk Adult (2 of 2 - PPSV23) 06/23/2018    Colorectal cancer screening: No longer required.   Mammogram status: No longer required due to age.  Bone Density status: Completed 08/31/16. Results reflect: Bone density results: OSTEOPENIA. Repeat every 2 years.  Lung Cancer Screening: (Low Dose CT Chest recommended if Age 52-80 years, 30 pack-year currently smoking OR have quit w/in 15years.) does not qualify.     Additional Screening:  Hepatitis C Screening: does not qualify  Vision Screening: Recommended annual ophthalmology exams for early detection of glaucoma and other disorders of the eye. Is the patient up to date with their annual eye exam?  Yes  Who is the provider or what is the name of the office in which the patient attends annual eye exams? Dr. Alvester MorinBell  Dental Screening: Recommended annual dental exams for proper oral hygiene  Community Resource Referral / Chronic Care Management: CRR required this visit?  No   CCM required this visit?  No      Plan:     I have personally reviewed and noted the following in the patient's chart:   . Medical and social history . Use of alcohol, tobacco or illicit drugs  . Current medications and supplements . Functional ability and status . Nutritional status . Physical activity . Advanced directives . List of other physicians . Hospitalizations, surgeries, and ER visits in previous 12 months . Vitals . Screenings to include cognitive, depression, and falls . Referrals and appointments  In addition, I have reviewed and discussed with patient certain preventive protocols, quality metrics, and best practice recommendations. A written personalized care plan for preventive services as well as general preventive health recommendations were provided to patient.     Reather LittlerKasey Amilcar Reever, LPN   1/61/09601/18/2022   Nurse Notes: none

## 2020-04-30 ENCOUNTER — Other Ambulatory Visit: Payer: Self-pay

## 2020-04-30 ENCOUNTER — Ambulatory Visit: Payer: Medicare Other | Admitting: Family Medicine

## 2020-04-30 ENCOUNTER — Ambulatory Visit (INDEPENDENT_AMBULATORY_CARE_PROVIDER_SITE_OTHER): Payer: Medicare Other | Admitting: Family Medicine

## 2020-04-30 ENCOUNTER — Encounter: Payer: Self-pay | Admitting: Family Medicine

## 2020-04-30 VITALS — BP 120/50 | HR 56 | Temp 97.4°F | Resp 16 | Ht 64.0 in | Wt 134.0 lb

## 2020-04-30 DIAGNOSIS — L8942 Pressure ulcer of contiguous site of back, buttock and hip, stage 2: Secondary | ICD-10-CM | POA: Diagnosis not present

## 2020-04-30 DIAGNOSIS — Z23 Encounter for immunization: Secondary | ICD-10-CM

## 2020-04-30 NOTE — Progress Notes (Signed)
   SUBJECTIVE:   CHIEF COMPLAINT / HPI:   Patient Active Problem List   Diagnosis Date Noted  . History of fracture of right hip 01/24/2020  . DVT of deep femoral vein, left (HCC)   . Left leg DVT (HCC) 01/19/2020  . Pressure injury of skin 01/19/2020  . Closed right hip fracture (HCC) 12/12/2019  . Fall 12/12/2019  . Preoperative clearance 12/12/2019  . Chronic systolic heart failure (HCC) 10/12/2019  . At risk for fall due to comorbid condition 10/12/2019  . Senile purpura (HCC) 08/31/2019  . Dilated cardiomyopathy (HCC) 05/03/2019  . Osteopenia 06/22/2017  . HTN (hypertension) 02/10/2017  . Dementia of the Alzheimer's type without behavioral disturbance (HCC) 11/04/2015  . Gradual-onset memory impairment 10/25/2015  . Hypothyroidism 05/07/2015  . Hyperlipidemia 05/07/2015  . Hyperglycemia 05/07/2015  . Aortic insufficiency 06/14/2013   SORE - bedsores on bottom since hospitalization in September, also behind R ear - has dementia and scratches areas often - no discharge but noting lots of blood. On blood thinner.  - has urinary incontinence, in diapers - no fevers.  - has tried desitin, gold bond, silicone pads.  - never been seen by wound care.    OBJECTIVE:   BP (!) 120/50   Pulse (!) 56   Temp (!) 97.4 F (36.3 C) (Oral)   Resp 16   Ht 5\' 4"  (1.626 m)   Wt 134 lb (60.8 kg)   SpO2 98%   BMI 23.00 kg/m   Gen: elderly, in NAD Skin: ~1cm scabbed area noted behind R ear, no discharge, surrounding erythema or blood noted. Few areas of superficial skin breakdown noted to b/l buttocks with copious bleeding once dressing/diaper removed. No purulent discharge or tunneling.   ASSESSMENT/PLAN:   Pressure injury of skin Exacerbated by wheelchair-bound status, dementia. No signs of infection on exam today. Given persistence and refractory to topical care, will refer to wound care. Recommend frequent emollient use to behind ear to prevent scratching.   , DO

## 2020-04-30 NOTE — Patient Instructions (Signed)
It was great to see you!  Our plans for today:  - We are referring you to the wound care clinic.  - Continue to use the topical treatments and silicone pads as you have been.   Take care and seek immediate care sooner if you develop any concerns.   Dr. Linwood Dibbles

## 2020-04-30 NOTE — Assessment & Plan Note (Addendum)
Exacerbated by wheelchair-bound status, dementia. No signs of infection on exam today. Given persistence and refractory to topical care, will refer to wound care. Recommend frequent emollient use to behind ear to prevent scratching.

## 2020-05-02 ENCOUNTER — Encounter: Payer: Medicare Other | Attending: Physician Assistant | Admitting: Physician Assistant

## 2020-05-02 ENCOUNTER — Other Ambulatory Visit: Payer: Self-pay

## 2020-05-02 DIAGNOSIS — F0150 Vascular dementia without behavioral disturbance: Secondary | ICD-10-CM | POA: Insufficient documentation

## 2020-05-02 DIAGNOSIS — I509 Heart failure, unspecified: Secondary | ICD-10-CM | POA: Diagnosis not present

## 2020-05-02 DIAGNOSIS — L249 Irritant contact dermatitis, unspecified cause: Secondary | ICD-10-CM | POA: Diagnosis not present

## 2020-05-02 DIAGNOSIS — L98412 Non-pressure chronic ulcer of buttock with fat layer exposed: Secondary | ICD-10-CM | POA: Insufficient documentation

## 2020-05-02 DIAGNOSIS — L89892 Pressure ulcer of other site, stage 2: Secondary | ICD-10-CM | POA: Diagnosis not present

## 2020-05-03 NOTE — Progress Notes (Signed)
KENYATTE, GRUBER ([id number]) Visit Report for 05/02/2020 Chief Complaint Document Details Patient Name: [name], [name]. Date of Service: 05/02/2020 2:00 PM Medical Record Number: [id number] Patient Account Number: [id number] Date of Birth/Sex: [birth year] (87 y.o. F) Treating RN: Rogers Blocker Primary Care Provider: Ellwood Dense Other Clinician: Referring Provider: Ellwood Dense Treating Provider/Extender: Rowan Blase in Treatment: 0 Information Obtained from: Patient Chief Complaint Gluteal and right ear ulcers Electronic Signature(s) Signed: 05/02/2020 2:54:43 PM By: Lenda Kelp PA-C Entered By: Lenda Kelp on 05/02/2020 14:54:43 Paulsen, [name] ([id number]) -------------------------------------------------------------------------------- HPI Details Patient Name: [name] Date of Service: 05/02/2020 2:00 PM Medical Record Number: [id number] Patient Account Number: [id number] Date of Birth/Sex: Mar 04, 1928 (87 y.o. F) Treating RN: Rogers Blocker Primary Care Provider: Ellwood Dense Other Clinician: Referring Provider: Ellwood Dense Treating Provider/Extender: Rowan Blase in Treatment: 0 History of Present Illness HPI Description: 05/02/2020 upon evaluation today patient appears to be doing somewhat poorly in regard to her gluteal region. She has moisture associated skin breakdown in this area there is a lot of irritation. With that being said unfortunately pretty much anything that is put on this area causes it to become irritated and the patient will apparently get her hands back there and pull off any dressings and mess with the wound scratching a lot. With that being said I think this is the biggest issue at this point. There does not appear to be any signs of active infection which is good and again I do not see any signs of deep tissue injury. She has a small area on the posterior aspect of her auricle but again I think that  is pretty much healed based on what I am seeing today. Patient does have dementia unfortunately she has congestive heart failure and all this began when she broke her hip and was in rehab in September 2021. Electronic Signature(s) Signed: 05/02/2020 3:32:14 PM By: Lenda Kelp PA-C Entered By: Lenda Kelp on 05/02/2020 15:32:13 [name], [name] ([id number]) -------------------------------------------------------------------------------- Physical Exam Details Patient Name: [name], [name]. Date of Service: 05/02/2020 2:00 PM Medical Record Number: [id number] Patient Account Number: [id number] Date of Birth/Sex: 1928/01/25 (87 y.o. F) Treating RN: Rogers Blocker Primary Care Provider: Ellwood Dense Other Clinician: Referring Provider: Ellwood Dense Treating Provider/Extender: Rowan Blase in Treatment: 0 Constitutional sitting or standing blood pressure is within target range for patient.. pulse regular and within target range for patient.Marland Kitchen respirations regular, non- labored and within target range for patient.Marland Kitchen temperature within target range for patient.. Well-nourished and well-hydrated in no acute distress. Eyes conjunctiva clear no eyelid edema noted. pupils equal round and reactive to light and accommodation. Ears, Nose, Mouth, and Throat no gross abnormality of ear auricles or external auditory canals. normal hearing noted during conversation. mucus membranes moist. Respiratory normal breathing without difficulty. Cardiovascular no clubbing, cyanosis, significant edema, <3 sec cap refill. Musculoskeletal Patient unable to walk without assistance. no significant deformity or arthritic changes, no loss or range of motion, no clubbing. Psychiatric this patient is able to make decisions and demonstrates good insight into disease process. Alert and Oriented x 3. pleasant and cooperative. Notes Upon inspection patient's wounds again are more moisture associated  skin breakdown in regard to the gluteal area bilaterally. I think that this is something that would actually benefit more from a good skin protectant as opposed to a dressing. Unfortunately any kind of creams or otherwise it is been attempted to have not really been beneficial and effective. I  really think that she may benefit from the Cavilon advanced skin protectant that would help up to 7 days to actually protect this area. Again that is much more involved than just a traditional skin protectant. Nonetheless that would be all the dressing she really needed if they wanted to use a barrier cream that could do that but I think that that may be the ideal way to go especially considering the fact that she tends to pull everything off that is applied. Electronic Signature(s) Signed: 05/02/2020 3:33:20 PM By: Lenda Kelp PA-C Entered By: Lenda Kelp on 05/02/2020 15:33:20 [name], [name] ([id number]) -------------------------------------------------------------------------------- Physician Orders Details Patient Name: [name] Date of Service: 05/02/2020 2:00 PM Medical Record Number: [id number] Patient Account Number: [id number] Date of Birth/Sex: 07-18-1927 (87 y.o. F) Treating RN: Rogers Blocker Primary Care Provider: Ellwood Dense Other Clinician: Referring Provider: Ellwood Dense Treating Provider/Extender: Rowan Blase in Treatment: 0 Verbal / Phone Orders: No Diagnosis Coding ICD-10 Coding Code Description L24.9 Irritant contact dermatitis, unspecified cause L89.892 Pressure ulcer of other site, stage 2 L98.412 Non-pressure chronic ulcer of buttock with fat layer exposed M62.81 Muscle weakness (generalized) F01.50 Vascular dementia without behavioral disturbance Follow-up Appointments Wound #1 Right Gluteus o Return Appointment in 3 weeks. Wound #2 Left Gluteus o Return Appointment in 3 weeks. Wound #3 Right Ear o Return Appointment in 3  weeks. Off-Loading Wound #1 Right Gluteus o Turn and reposition every 2 hours Wound #2 Left Gluteus o Turn and reposition every 2 hours Wound Treatment Wound #1 - Gluteus Wound Laterality: Right Cleanser: Normal Saline 1 x Per Day/30 Days Discharge Instructions: Wash your hands with soap and water. Remove old dressing, discard into plastic bag and place into trash. Cleanse the wound with Normal Saline prior to applying a clean dressing using gauze sponges, not tissues or cotton balls. Do not scrub or use excessive force. Pat dry using gauze sponges, not tissue or cotton balls. Cleanser: Soap and Water 1 x Per Day/30 Days Discharge Instructions: Gently cleanse wound with antibacterial soap, rinse and pat dry prior to dressing wounds Peri-Wound Care: Desitin Maximum Strength Ointment 4 (oz) 1 x Per Day/30 Days Peri-Wound Care: Skin Prep 1 x Per Day/30 Days Discharge Instructions: Use skin prep as directed Wound #2 - Gluteus Wound Laterality: Left Cleanser: Normal Saline 1 x Per Day/30 Days Discharge Instructions: Wash your hands with soap and water. Remove old dressing, discard into plastic bag and place into trash. Cleanse the wound with Normal Saline prior to applying a clean dressing using gauze sponges, not tissues or cotton balls. Do not scrub or use excessive force. Pat dry using gauze sponges, not tissue or cotton balls. Cleanser: Soap and Water 1 x Per Day/30 Days Discharge Instructions: Gently cleanse wound with antibacterial soap, rinse and pat dry prior to dressing wounds Peri-Wound Care: Desitin Maximum Strength Ointment 4 (oz) 1 x Per Day/30 Days Peri-Wound Care: Skin Prep 1 x Per Day/30 Days Discharge Instructions: Use skin prep as directed [name], [name] ([id number]) Wound #3 - Ear Wound Laterality: Right Secondary Dressing: Coverlet Latex-Free Fabric Adhesive Dressings 1 x Per Day/30 Days Discharge Instructions: 1.5 x 2 Electronic Signature(s) Signed: 05/02/2020  4:58:35 PM By: Lenda Kelp PA-C Signed: 05/02/2020 5:25:46 PM By: Phillis Haggis, Dondra Prader RN Entered By: Phillis Haggis, Dondra Prader on 05/02/2020 15:37:53 [name], [name] ([id number]) -------------------------------------------------------------------------------- Problem List Details Patient Name: [name]. Date of Service: 05/02/2020 2:00 PM Medical Record Number: [id number] Patient Account Number: [id number] Date  of Birth/Sex: [birth year] (87 y.o. F) Treating RN: Rogers Blocker Primary Care Provider: Ellwood Dense Other Clinician: Referring Provider: Ellwood Dense Treating Provider/Extender: Rowan Blase in Treatment: 0 Active Problems ICD-10 Encounter Code Description Active Date MDM Diagnosis L24.9 Irritant contact dermatitis, unspecified cause 05/02/2020 No Yes L89.892 Pressure ulcer of other site, stage 2 05/02/2020 No Yes L98.412 Non-pressure chronic ulcer of buttock with fat layer exposed 05/02/2020 No Yes M62.81 Muscle weakness (generalized) 05/02/2020 No Yes F01.50 Vascular dementia without behavioral disturbance 05/02/2020 No Yes Inactive Problems Resolved Problems Electronic Signature(s) Signed: 05/02/2020 2:54:18 PM By: Lenda Kelp PA-C Entered By: Lenda Kelp on 05/02/2020 14:54:17 [place], [name] ([id number]) -------------------------------------------------------------------------------- Progress Note Details Patient Name: [name] Date of Service: 05/02/2020 2:00 PM Medical Record Number: [id number] Patient Account Number: [id number] Date of Birth/Sex: 06-08-1927 (87 y.o. F) Treating RN: Rogers Blocker Primary Care Provider: Ellwood Dense Other Clinician: Referring Provider: Ellwood Dense Treating Provider/Extender: Rowan Blase in Treatment: 0 Subjective Chief Complaint Information obtained from Patient Gluteal and right ear ulcers History of Present Illness (HPI) 05/02/2020 upon evaluation today patient  appears to be doing somewhat poorly in regard to her gluteal region. She has moisture associated skin breakdown in this area there is a lot of irritation. With that being said unfortunately pretty much anything that is put on this area causes it to become irritated and the patient will apparently get her hands back there and pull off any dressings and mess with the wound scratching a lot. With that being said I think this is the biggest issue at this point. There does not appear to be any signs of active infection which is good and again I do not see any signs of deep tissue injury. She has a small area on the posterior aspect of her auricle but again I think that is pretty much healed based on what I am seeing today. Patient does have dementia unfortunately she has congestive heart failure and all this began when she broke her hip and was in rehab in September 2021. Patient History Unable to Obtain Patient History due to Dementia. Allergies No Known Drug Allergies Social History Never smoker, Marital Status - Widowed, Alcohol Use - Never, Drug Use - No History, Caffeine Use - Never. Medical History Cardiovascular Patient has history of Congestive Heart Failure Neurologic Patient has history of Dementia Hospitalization/Surgery History - Right Hip. Review of Systems (ROS) Constitutional Symptoms (General Health) Denies complaints or symptoms of Fatigue, Fever, Chills, Marked Weight Change. Eyes Denies complaints or symptoms of Dry Eyes, Vision Changes, Glasses / Contacts. Ear/Nose/Mouth/Throat Denies complaints or symptoms of Difficult clearing ears, Sinusitis. Hematologic/Lymphatic Complains or has symptoms of Bleeding / Clotting Disorders - Blood clots left leg 2021. Respiratory Denies complaints or symptoms of Chronic or frequent coughs, Shortness of Breath. Cardiovascular Complains or has symptoms of LE edema. Denies complaints or symptoms of Chest pain. Gastrointestinal Denies  complaints or symptoms of Frequent diarrhea, Nausea, Vomiting. Endocrine Denies complaints or symptoms of Hepatitis, Thyroid disease, Polydypsia (Excessive Thirst). Genitourinary Complains or has symptoms of Incontinence/dribbling. Immunological Complains or has symptoms of Itching. Denies complaints or symptoms of Hives. Integumentary (Skin) Complains or has symptoms of Wounds, Bleeding or bruising tendency. Musculoskeletal Denies complaints or symptoms of Muscle Pain, Muscle Weakness. Psychiatric Denies complaints or symptoms of Anxiety, Claustrophobia. [name], [name]. ([id number]) Objective Constitutional sitting or standing blood pressure is within target range for patient.. pulse regular and within target range for patient.Marland Kitchen respirations regular, non- labored and  within target range for patient.Marland Kitchen temperature within target range for patient.. Well-nourished and well-hydrated in no acute distress. Vitals Time Taken: 2:05 PM, Temperature: 97.9 F, Pulse: 63 bpm, Respiratory Rate: 18 breaths/min, Blood Pressure: 121/42 mmHg. Eyes conjunctiva clear no eyelid edema noted. pupils equal round and reactive to light and accommodation. Ears, Nose, Mouth, and Throat no gross abnormality of ear auricles or external auditory canals. normal hearing noted during conversation. mucus membranes moist. Respiratory normal breathing without difficulty. Cardiovascular no clubbing, cyanosis, significant edema, Musculoskeletal Patient unable to walk without assistance. no significant deformity or arthritic changes, no loss or range of motion, no clubbing. Psychiatric this patient is able to make decisions and demonstrates good insight into disease process. Alert and Oriented x 3. pleasant and cooperative. General Notes: Upon inspection patient's wounds again are more moisture associated skin breakdown in regard to the gluteal area bilaterally. I think that this is something that would actually  benefit more from a good skin protectant as opposed to a dressing. Unfortunately any kind of creams or otherwise it is been attempted to have not really been beneficial and effective. I really think that she may benefit from the Cavilon advanced skin protectant that would help up to 7 days to actually protect this area. Again that is much more involved than just a traditional skin protectant. Nonetheless that would be all the dressing she really needed if they wanted to use a barrier cream that could do that but I think that that may be the ideal way to go especially considering the fact that she tends to pull everything off that is applied. Integumentary (Hair, Skin) Wound #1 status is Open. Original cause of wound was Gradually Appeared. The wound is located on the Right Gluteus. The wound measures 4cm length x 4cm width x 0.2cm depth; 12.566cm^2 area and 2.513cm^3 volume. There is Fat Layer (Subcutaneous Tissue) exposed. There is no tunneling or undermining noted. There is a large amount of serous drainage noted. There is large (67-100%) pink granulation within the wound bed. There is a small (1-33%) amount of necrotic tissue within the wound bed including Adherent Slough. Wound #2 status is Open. Original cause of wound was Gradually Appeared. The wound is located on the Left Gluteus. The wound measures 4cm length x 5cm width x 0.2cm depth; 15.708cm^2 area and 3.142cm^3 volume. There is Fat Layer (Subcutaneous Tissue) exposed. There is no tunneling or undermining noted. There is a medium amount of serous drainage noted. The wound margin is flat and intact. There is medium (34- 66%) red granulation within the wound bed. There is a small (1-33%) amount of necrotic tissue within the wound bed including Adherent Slough. Wound #3 status is Open. Original cause of wound was Gradually Appeared. The wound is located on the Right Ear. The wound measures 0.8cm length x 0.3cm width x 0.1cm depth; 0.188cm^2  area and 0.019cm^3 volume. There is Fat Layer (Subcutaneous Tissue) exposed. There is no tunneling or undermining noted. There is a small amount of serous drainage noted. The wound margin is flat and intact. There is large (67-100%) granulation within the wound bed. There is no necrotic tissue within the wound bed. Assessment Active Problems ICD-10 Irritant contact dermatitis, unspecified cause Pressure ulcer of other site, stage 2 Non-pressure chronic ulcer of buttock with fat layer exposed Muscle weakness (generalized) Vascular dementia without behavioral disturbance [name], [name]. ([id number]) Plan Follow-up Appointments: Wound #1 Right Gluteus: Return Appointment in 3 weeks. Wound #2 Left Gluteus: Return Appointment in 3  weeks. Wound #3 Right Ear: Return Appointment in 3 weeks. Off-Loading: Wound #1 Right Gluteus: Turn and reposition every 2 hours Wound #2 Left Gluteus: Turn and reposition every 2 hours WOUND #1: - Gluteus Wound Laterality: Right Cleanser: Normal Saline 1 x Per Day/30 Days Discharge Instructions: Wash your hands with soap and water. Remove old dressing, discard into plastic bag and place into trash. Cleanse the wound with Normal Saline prior to applying a clean dressing using gauze sponges, not tissues or cotton balls. Do not scrub or use excessive force. Pat dry using gauze sponges, not tissue or cotton balls. Cleanser: Soap and Water 1 x Per Day/30 Days Discharge Instructions: Gently cleanse wound with antibacterial soap, rinse and pat dry prior to dressing wounds Peri-Wound Care: Desitin Maximum Strength Ointment 4 (oz) 1 x Per Day/30 Days Peri-Wound Care: Skin Prep 1 x Per Day/30 Days Discharge Instructions: Use skin prep as directed 1. Would recommend currently that we initiate treatment with a Cavilon advanced skin protectant and the patient's daughter is going to look into ordering this for her. I did give her the information today. 2. Also can  recommend that we actually have the patient continue to try to keep pressure off of the area this is something I discussed with the daughter other than to try as much as possible to attempt this. 3. We discussed the possibility of seeing about the primary care provider given an order for a catheter my concern here is that she would mess with the catheter but is believed causing some kind of trauma that would not be good. 4. I am also can recommend that the patient continue to monitor for any signs of worsening regard to the ear right now this appears to be a very small area that I do not think is good to be a major problem and to be honest I think this using an over-the-counter next care waterproof foam and tape is probably the way to go to protect this until it is completely gone and again it is a fairly cheap way to have a small piece of the foam over this area to protect it. We will see patient back for reevaluation in 3 weeks here in the clinic. If anything worsens or changes patient will contact our office for additional recommendations. Electronic Signature(s) Signed: 05/02/2020 3:34:29 PM By: Lenda Kelp PA-C Entered By: Lenda Kelp on 05/02/2020 15:34:29 [name], [name] ([id number]) -------------------------------------------------------------------------------- ROS/PFSH Details Patient Name: [name] Date of Service: 05/02/2020 2:00 PM Medical Record Number: [id number] Patient Account Number: [id number] Date of Birth/Sex: 1928-01-21 (87 y.o. F) Treating RN: Huel Coventry Primary Care Provider: Ellwood Dense Other Clinician: Referring Provider: Ellwood Dense Treating Provider/Extender: Rowan Blase in Treatment: 0 Unable to Obtain Patient History due to oo Dementia Constitutional Symptoms (General Health) Complaints and Symptoms: Negative for: Fatigue; Fever; Chills; Marked Weight Change Eyes Complaints and Symptoms: Negative for: Dry Eyes; Vision  Changes; Glasses / Contacts Ear/Nose/Mouth/Throat Complaints and Symptoms: Negative for: Difficult clearing ears; Sinusitis Hematologic/Lymphatic Complaints and Symptoms: Positive for: Bleeding / Clotting Disorders - Blood clots left leg 2021 Respiratory Complaints and Symptoms: Negative for: Chronic or frequent coughs; Shortness of Breath Cardiovascular Complaints and Symptoms: Positive for: LE edema Negative for: Chest pain Medical History: Positive for: Congestive Heart Failure Gastrointestinal Complaints and Symptoms: Negative for: Frequent diarrhea; Nausea; Vomiting Endocrine Complaints and Symptoms: Negative for: Hepatitis; Thyroid disease; Polydypsia (Excessive Thirst) Genitourinary Complaints and Symptoms: Positive for: Incontinence/dribbling Immunological Complaints and Symptoms: Positive  for: Itching Negative for: Pennelope BrackenHives [name], [name]. ([id number]) Integumentary (Skin) Complaints and Symptoms: Positive for: Wounds; Bleeding or bruising tendency Musculoskeletal Complaints and Symptoms: Negative for: Muscle Pain; Muscle Weakness Psychiatric Complaints and Symptoms: Negative for: Anxiety; Claustrophobia Neurologic Medical History: Positive for: Dementia Oncologic Immunizations Pneumococcal Vaccine: Received Pneumococcal Vaccination: Yes Implantable Devices None Hospitalization / Surgery History Type of Hospitalization/Surgery Right Hip Family and Social History Never smoker; Marital Status - Widowed; Alcohol Use: Never; Drug Use: No History; Caffeine Use: Never Electronic Signature(s) Signed: 05/02/2020 4:58:35 PM By: Lenda KelpStone III, Cici Rodriges PA-C Signed: 05/03/2020 12:58:49 PM By: Elliot GurneyWoody, BSN, RN, CWS, Kim RN, BSN Entered By: Elliot GurneyWoody, BSN, RN, CWS, Kim on 05/02/2020 14:24:24 [name], Deborah BonitoJERALDINE Mack. ([id number]) -------------------------------------------------------------------------------- SuperBill Details Patient Name: Deborah SchleinGRIFFIN, Anitria Mack. Date of Service:  05/02/2020 Medical Record Number: [id number] Patient Account Number: [id number] Date of Birth/Sex: 1927/11/14 (87 y.o. F) Treating RN: Rogers BlockerSanchez, Kenia Primary Care Provider: Ellwood Denseumball, Alison Other Clinician: Referring Provider: Ellwood Denseumball, Alison Treating Provider/Extender: Rowan BlaseStone, Kaylon Laroche Weeks in Treatment: 0 Diagnosis Coding ICD-10 Codes Code Description L24.9 Irritant contact dermatitis, unspecified cause L89.892 Pressure ulcer of other site, stage 2 L98.412 Non-pressure chronic ulcer of buttock with fat layer exposed M62.81 Muscle weakness (generalized) F01.50 Vascular dementia without behavioral disturbance Facility Procedures CPT4 Code: [id number] Description: 99213 - WOUND CARE VISIT-LEV 3 EST PT Modifier: Quantity: 1 Physician Procedures CPT4 Code: [id number] Description: WC PHYS LEVEL 3 o NEW PT Modifier: Quantity: 1 CPT4 Code: Description: ICD-10 Diagnosis Description L24.9 Irritant contact dermatitis, unspecified cause L89.892 Pressure ulcer of other site, stage 2 L98.412 Non-pressure chronic ulcer of buttock with fat layer exposed M62.81 Muscle weakness (generalized) Modifier: Quantity: Electronic Signature(s) Signed: 05/02/2020 4:58:35 PM By: Lenda KelpStone III, Raun Routh PA-C Signed: 05/02/2020 5:25:46 PM By: Lajean ManesSanchez Pereyda, Kenia RN Previous Signature: 05/02/2020 3:34:53 PM Version By: Lenda KelpStone III, Rubie Ficco PA-C Previous Signature: 05/02/2020 3:34:40 PM Version By: Lenda KelpStone III, Saafir Abdullah PA-C Entered By: Phillis HaggisSanchez Pereyda, Dondra PraderKenia on 05/02/2020 15:39:03

## 2020-05-03 NOTE — Progress Notes (Addendum)
TELECIA, LAROCQUE ([id number]) Visit Report for 05/02/2020 Allergy List Details Patient Name: TAMEEKA, LUO. Date of Service: 05/02/2020 2:00 PM Medical Record Number: [id number] Patient Account Number: [id number] Date of Birth/Sex: 20-Nov-1927 (87 y.o. F) Treating RN: Huel Coventry Primary Care Rahaf Carbonell: Ellwood Dense Other Clinician: Referring Daveigh Batty: Ellwood Dense Treating Hadiyah Maricle/Extender: Rowan Blase in Treatment: 0 Allergies Active Allergies No Known Drug Allergies Type: Allergen Allergy Notes Electronic Signature(s) Signed: 05/03/2020 12:58:49 PM By: Elliot Gurney, BSN, RN, CWS, Kim RN, BSN Entered By: Elliot Gurney, BSN, RN, CWS, Kim on 05/02/2020 14:14:25 Weed, [name] ([id number]) -------------------------------------------------------------------------------- Arrival Information Details Patient Name: KAMALJIT, HIZER. Date of Service: 05/02/2020 2:00 PM Medical Record Number: [id number] Patient Account Number: [id number] Date of Birth/Sex: August 20, 1927 (87 y.o. F) Treating RN: Rogers Blocker Primary Care Bonnetta Allbee: Ellwood Dense Other Clinician: Referring Kensington Duerst: Ellwood Dense Treating Lamel Mccarley/Extender: Rowan Blase in Treatment: 0 Visit Information Patient Arrived: Wheel Chair Arrival Time: 14:10 Accompanied By: daughter Transfer Assistance: Manual Patient Identification Verified: Yes Secondary Verification Process Completed: Yes Patient Has Alerts: Yes Patient Alerts: Patient on Blood Thinner Eliquis NOT DIABETIC Electronic Signature(s) Signed: 05/03/2020 12:58:49 PM By: Elliot Gurney, BSN, RN, CWS, Kim RN, BSN Entered By: Elliot Gurney, BSN, RN, CWS, Kim on 05/02/2020 14:37:13 Tesoro, [name] ([id number]) -------------------------------------------------------------------------------- Clinic Level of Care Assessment Details Patient Name: NOORA, LOCASCIO. Date of Service: 05/02/2020 2:00 PM Medical Record Number: [id number] Patient Account  Number: [id number] Date of Birth/Sex: March 27, 1927 (87 y.o. F) Treating RN: Rogers Blocker Primary Care Alaric Gladwin: Ellwood Dense Other Clinician: Referring Helayne Metsker: Ellwood Dense Treating Dj Senteno/Extender: Rowan Blase in Treatment: 0 Clinic Level of Care Assessment Items TOOL 4 Quantity Score X - Use when only an EandM is performed on FOLLOW-UP visit 1 0 ASSESSMENTS - Nursing Assessment / Reassessment X - Reassessment of Co-morbidities (includes updates in patient status) 1 10 X- 1 5 Reassessment of Adherence to Treatment Plan ASSESSMENTS - Wound and Skin Assessment / Reassessment X - Simple Wound Assessment / Reassessment - one wound 1 5 []  - 0 Complex Wound Assessment / Reassessment - multiple wounds []  - 0 Dermatologic / Skin Assessment (not related to wound area) ASSESSMENTS - Focused Assessment []  - Circumferential Edema Measurements - multi extremities 0 []  - 0 Nutritional Assessment / Counseling / Intervention []  - 0 Lower Extremity Assessment (monofilament, tuning fork, pulses) []  - 0 Peripheral Arterial Disease Assessment (using hand held doppler) ASSESSMENTS - Ostomy and/or Continence Assessment and Care []  - Incontinence Assessment and Management 0 []  - 0 Ostomy Care Assessment and Management (repouching, etc.) PROCESS - Coordination of Care X - Simple Patient / Family Education for ongoing care 1 15 []  - 0 Complex (extensive) Patient / Family Education for ongoing care []  - 0 Staff obtains , Records, Test Results / Process Orders []  - 0 Staff telephones HHA, Nursing Homes / Clarify orders / etc []  - 0 Routine Transfer to another Facility (non-emergent condition) []  - 0 Routine Hospital Admission (non-emergent condition) []  - 0 New Admissions / / Ordering NPWT, Apligraf, etc. []  - 0 Emergency Hospital Admission (emergent condition) X- 1 10 Simple Discharge Coordination []  - 0 Complex (extensive) Discharge  Coordination PROCESS - Special Needs []  - Pediatric / Minor Patient Management 0 []  - 0 Isolation Patient Management []  - 0 Hearing / Language / Visual special needs []  - 0 Assessment of Community assistance (transportation, D/C planning, etc.) []  - 0 Additional assistance / Altered mentation []  - 0 Support Surface(s) Assessment (bed, cushion, seat, etc.)  INTERVENTIONS - Wound Cleansing / Measurement Dodds, Leshawn N. ([id number]) []  - 0 Simple Wound Cleansing - one wound X- 2 5 Complex Wound Cleansing - multiple wounds X- 1 5 Wound Imaging (photographs - any number of wounds) []  - 0 Wound Tracing (instead of photographs) []  - 0 Simple Wound Measurement - one wound X- 2 5 Complex Wound Measurement - multiple wounds INTERVENTIONS - Wound Dressings []  - Small Wound Dressing one or multiple wounds 0 []  - 0 Medium Wound Dressing one or multiple wounds []  - 0 Large Wound Dressing one or multiple wounds X- 1 5 Application of Medications - topical []  - 0 Application of Medications - injection INTERVENTIONS - Miscellaneous []  - External ear exam 0 []  - 0 Specimen Collection (cultures, biopsies, blood, body fluids, etc.) []  - 0 Specimen(s) / Culture(s) sent or taken to Lab for analysis []  - 0 Patient Transfer (multiple staff / Nurse, adultHoyer Lift / Similar devices) []  - 0 Simple Staple / Suture removal (25 or less) []  - 0 Complex Staple / Suture removal (26 or more) []  - 0 Hypo / Hyperglycemic Management (close monitor of Blood Glucose) []  - 0 Ankle / Brachial Index (ABI) - do not check if billed separately X- 1 5 Vital Signs Has the patient been seen at the hospital within the last three years: Yes Total Score: 80 Level Of Care: New/Established - Level 3 Electronic Signature(s) Signed: 05/02/2020 5:25:46 PM By: Phillis HaggisSanchez Pereyda, Dondra PraderKenia RN Entered By: Phillis HaggisSanchez Pereyda, Kenia on 05/02/2020 15:38:52 Deshaies, Brooke BonitoJERALDINE N.  ([id number]) -------------------------------------------------------------------------------- Encounter Discharge Information Details Patient Name: Ewing SchleinGRIFFIN, Zhavia N. Date of Service: 05/02/2020 2:00 PM Medical Record Number: [id number] Patient Account Number: [id number] Date of Birth/Sex: [birth year] (87 y.o. F) Treating RN: Rogers BlockerSanchez, Kenia Primary Care Meklit Cotta: Ellwood Denseumball, Alison Other Clinician: Referring Lita Flynn: Ellwood Denseumball, Alison Treating Kailoni Vahle/Extender: Rowan BlaseStone, Hoyt Weeks in Treatment: 0 Encounter Discharge Information Items Discharge Condition: Stable Ambulatory Status: Wheelchair Discharge Destination: Home Transportation: Private Auto Accompanied By: daughter Schedule Follow-up Appointment: Yes Clinical Summary of Care: Electronic Signature(s) Signed: 05/02/2020 5:25:46 PM By: Phillis HaggisSanchez Pereyda, Dondra PraderKenia RN Entered By: Phillis HaggisSanchez Pereyda, Dondra PraderKenia on 05/02/2020 15:40:25 Duby, Brooke BonitoJERALDINE N. ([id number]) -------------------------------------------------------------------------------- Lower Extremity Assessment Details Patient Name: Ewing SchleinGRIFFIN, Chrishawna N. Date of Service: 05/02/2020 2:00 PM Medical Record Number: [id number] Patient Account Number: [id number] Date of Birth/Sex: [birth year] (87 y.o. F) Treating RN: Huel CoventryWoody, Kim Primary Care Jayle Solarz: Ellwood Denseumball, Alison Other Clinician: Referring Zacarias Krauter: Ellwood Denseumball, Alison Treating Kambria Grima/Extender: Rowan BlaseStone, Hoyt Weeks in Treatment: 0 Electronic Signature(s) Signed: 05/03/2020 12:58:49 PM By: Elliot GurneyWoody, BSN, RN, CWS, Kim RN, BSN Entered By: Elliot GurneyWoody, BSN, RN, CWS, Kim on 05/02/2020 14:36:48 Almendarez, Brooke BonitoJERALDINE N. ([id number]) -------------------------------------------------------------------------------- Multi Wound Chart Details Patient Name: Ewing SchleinGRIFFIN, Datha N. Date of Service: 05/02/2020 2:00 PM Medical Record Number: [id number] Patient Account Number: [id number] Date of Birth/Sex: [birth year] (87 y.o. F) Treating RN: Rogers BlockerSanchez, Kenia Primary Care  Nagee Goates: Ellwood Denseumball, Alison Other Clinician: Referring Quame Spratlin: Ellwood Denseumball, Alison Treating Leeyah Heather/Extender: Rowan BlaseStone, Hoyt Weeks in Treatment: 0 Vital Signs Height(in): Pulse(bpm): 63 Weight(lbs): Blood Pressure(mmHg): 121/42 Body Mass Index(BMI): Temperature(F): 97.9 Respiratory Rate(breaths/min): 18 Photos: Wound Location: Right Gluteus Left Gluteus Right Ear Wounding Event: Gradually Appeared Gradually Appeared Gradually Appeared Primary Etiology: MASD MASD Pressure Ulcer Comorbid History: Congestive Heart Failure, Dementia Congestive Heart Failure, Dementia Congestive Heart Failure, Dementia Date Acquired: 12/11/2019 12/11/2019 04/08/2020 Weeks of Treatment: 0 0 0 Wound Status: Open Open Open Clustered Wound: Yes Yes No Clustered Quantity: 5 4 N/A Measurements L x W x D (cm) 4x4x0.2 4x5x0.2 0.8x0.3x0.1 Area (cm) : 12.566 15.708 0.188  Volume (cm) : 2.513 3.142 0.019 % Reduction in Area: 0.00% N/A 0.00% % Reduction in Volume: 0.00% N/A 0.00% Classification: Full Thickness Without Exposed Full Thickness Without Exposed Category/Stage II Support Structures Support Structures Exudate Amount: Large Medium Small Exudate Type: Serous Serous Serous Exudate Color: amber amber amber Wound Margin: N/A Flat and Intact Flat and Intact Granulation Amount: Large (67-100%) Medium (34-66%) Large (67-100%) Granulation Quality: Pink Red N/A Necrotic Amount: Small (1-33%) Small (1-33%) None Present (0%) Exposed Structures: Fat Layer (Subcutaneous Tissue): Fat Layer (Subcutaneous Tissue): Fat Layer (Subcutaneous Tissue): Yes Yes Yes Fascia: No Fascia: No Fascia: No Tendon: No Tendon: No Tendon: No Muscle: No Muscle: No Muscle: No Joint: No Joint: No Joint: No Bone: No Bone: No Bone: No Epithelialization: Small (1-33%) Medium (34-66%) None Treatment Notes Electronic Signature(s) Signed: 05/02/2020 5:25:46 PM By: Phillis Haggis, Dondra Prader RN Entered By: Phillis Haggis, Kenia on  05/02/2020 15:13:39 Holub, [name] ([id number]) -------------------------------------------------------------------------------- Multi-Disciplinary Care Plan Details Patient Name: Ewing Schlein Date of Service: 05/02/2020 2:00 PM Medical Record Number: [id number] Patient Account Number: [id number] Date of Birth/Sex: 1927/04/21 (87 y.o. F) Treating RN: Rogers Blocker Primary Care Samara Stankowski: Ellwood Dense Other Clinician: Referring Caileen Veracruz: Ellwood Dense Treating Tollie Canada/Extender: Rowan Blase in Treatment: 0 Active Inactive Electronic Signature(s) Signed: 06/04/2020 10:24:11 AM By: Elliot Gurney BSN, RN, CWS, Kim RN, BSN Signed: 06/21/2020 11:57:44 AM By: Lajean Manes RN Previous Signature: 05/02/2020 5:25:46 PM Version By: Phillis Haggis, Dondra Prader RN Entered By: Elliot Gurney, BSN, RN, CWS, Kim on 06/04/2020 10:24:11 Oguin, [name] ([id number]) -------------------------------------------------------------------------------- Pain Assessment Details Patient Name: HADESSAH, GRENNAN. Date of Service: 05/02/2020 2:00 PM Medical Record Number: [id number] Patient Account Number: [id number] Date of Birth/Sex: 03-Nov-1927 (87 y.o. F) Treating RN: Rogers Blocker Primary Care Eshawn Coor: Ellwood Dense Other Clinician: Referring Mounir Skipper: Ellwood Dense Treating Kaia Depaolis/Extender: Rowan Blase in Treatment: 0 Active Problems Location of Pain Severity and Description of Pain Patient Has Paino No Site Locations Pain Management and Medication Current Pain Management: Electronic Signature(s) Signed: 05/02/2020 4:36:17 PM By: Sallee Provencal, RRT, CHT Signed: 05/02/2020 5:25:46 PM By: Phillis Haggis, Dondra Prader RN Entered By: Dayton Martes on 05/02/2020 14:11:11 Free, [name] ([id number]) -------------------------------------------------------------------------------- Patient/Caregiver Education Details Patient Name: Ewing Schlein Date of Service: 05/02/2020 2:00 PM Medical Record Number: [id number] Patient Account Number: [id number] Date of Birth/Gender: 02/23/1928 (87 y.o. F) Treating RN: Rogers Blocker Primary Care Physician: Ellwood Dense Other Clinician: Referring Physician: Ellwood Dense Treating Physician/Extender: Rowan Blase in Treatment: 0 Education Assessment Education Provided To: Caregiver Education Topics Provided Notes Advised on barrier options, reinforced offloading Electronic Signature(s) Signed: 05/02/2020 5:25:46 PM By: Phillis Haggis, Dondra Prader RN Entered By: Phillis Haggis, Kenia on 05/02/2020 15:39:48 Steveson, [name] ([id number]) -------------------------------------------------------------------------------- Wound Assessment Details Patient Name: Ewing Schlein. Date of Service: 05/02/2020 2:00 PM Medical Record Number: [id number] Patient Account Number: [id number] Date of Birth/Sex: 1927-03-17 (87 y.o. F) Treating RN: Huel Coventry Primary Care Serapio Edelson: Ellwood Dense Other Clinician: Referring Brizza Nathanson: Ellwood Dense Treating Sarye Kath/Extender: Rowan Blase in Treatment: 0 Wound Status Wound Number: 1 Primary Etiology: MASD Wound Location: Right Gluteus Wound Status: Open Wounding Event: Gradually Appeared Comorbid History: Congestive Heart Failure, Dementia Date Acquired: 12/11/2019 Weeks Of Treatment: 0 Clustered Wound: Yes Photos Photo Uploaded By: Elliot Gurney, BSN, RN, CWS, Kim on 05/02/2020 14:46:14 Wound Measurements Length: (cm) 4 Width: (cm) 4 Depth: (cm) 0.2 Clustered Quantity: 5 Area: (cm) 12.566 Volume: (cm) 2.513 % Reduction in Area: 0% % Reduction in Volume: 0% Epithelialization: Small (1-33%)  Tunneling: No Undermining: No Wound Description Classification: Full Thickness Without Exposed Support Structu Exudate Amount: Large Exudate Type: Serous Exudate Color: amber res Foul Odor After Cleansing: No Slough/Fibrino  Yes Wound Bed Granulation Amount: Large (67-100%) Exposed Structure Granulation Quality: Pink Fascia Exposed: No Necrotic Amount: Small (1-33%) Fat Layer (Subcutaneous Tissue) Exposed: Yes Necrotic Quality: Adherent Slough Tendon Exposed: No Muscle Exposed: No Joint Exposed: No Bone Exposed: No Electronic Signature(s) Signed: 05/03/2020 12:58:49 PM By: Elliot Gurney, BSN, RN, CWS, Kim RN, BSN Entered By: Elliot Gurney, BSN, RN, CWS, Kim on 05/02/2020 14:35:36 Hiegel, [name] ([id number]) -------------------------------------------------------------------------------- Wound Assessment Details Patient Name: Ewing Schlein. Date of Service: 05/02/2020 2:00 PM Medical Record Number: [id number] Patient Account Number: [id number] Date of Birth/Sex: [birth year] (87 y.o. F) Treating RN: Huel Coventry Primary Care Diella Gillingham: Ellwood Dense Other Clinician: Referring Empress Newmann: Ellwood Dense Treating Temara Lanum/Extender: Rowan Blase in Treatment: 0 Wound Status Wound Number: 2 Primary Etiology: MASD Wound Location: Left Gluteus Wound Status: Open Wounding Event: Gradually Appeared Comorbid History: Congestive Heart Failure, Dementia Date Acquired: 12/11/2019 Weeks Of Treatment: 0 Clustered Wound: Yes Photos Photo Uploaded By: Elliot Gurney, BSN, RN, CWS, Kim on 05/02/2020 14:46:15 Wound Measurements Length: (cm) 4 Width: (cm) 5 Depth: (cm) 0.2 Clustered Quantity: 4 Area: (cm) 15.708 Volume: (cm) 3.142 % Reduction in Area: % Reduction in Volume: Epithelialization: Medium (34-66%) Tunneling: No Undermining: No Wound Description Classification: Full Thickness Without Exposed Support Structu Wound Margin: Flat and Intact Exudate Amount: Medium Exudate Type: Serous Exudate Color: amber res Foul Odor After Cleansing: No Slough/Fibrino Yes Wound Bed Granulation Amount: Medium (34-66%) Exposed Structure Granulation Quality: Red Fascia Exposed: No Necrotic Amount: Small (1-33%) Fat  Layer (Subcutaneous Tissue) Exposed: Yes Necrotic Quality: Adherent Slough Tendon Exposed: No Muscle Exposed: No Joint Exposed: No Bone Exposed: No Electronic Signature(s) Signed: 05/03/2020 12:58:49 PM By: Elliot Gurney, BSN, RN, CWS, Kim RN, BSN Entered By: Elliot Gurney, BSN, RN, CWS, Kim on 05/02/2020 14:36:39 Thurman, [name] ([id number]) -------------------------------------------------------------------------------- Wound Assessment Details Patient Name: Ewing Schlein. Date of Service: 05/02/2020 2:00 PM Medical Record Number: [id number] Patient Account Number: [id number] Date of Birth/Sex: [birth year] (87 y.o. F) Treating RN: Huel Coventry Primary Care Romain Erion: Ellwood Dense Other Clinician: Referring Laveda Demedeiros: Ellwood Dense Treating Lindon Kiel/Extender: Rowan Blase in Treatment: 0 Wound Status Wound Number: 3 Primary Etiology: Pressure Ulcer Wound Location: Right Ear Wound Status: Open Wounding Event: Gradually Appeared Comorbid History: Congestive Heart Failure, Dementia Date Acquired: 04/08/2020 Weeks Of Treatment: 0 Clustered Wound: No Photos Wound Measurements Length: (cm) 0.8 Width: (cm) 0.3 Depth: (cm) 0.1 Area: (cm) 0.188 Volume: (cm) 0.019 % Reduction in Area: 0% % Reduction in Volume: 0% Epithelialization: None Tunneling: No Undermining: No Wound Description Classification: Category/Stage II Wound Margin: Flat and Intact Exudate Amount: Small Exudate Type: Serous Exudate Color: amber Foul Odor After Cleansing: No Slough/Fibrino Yes Wound Bed Granulation Amount: Large (67-100%) Exposed Structure Necrotic Amount: None Present (0%) Fascia Exposed: No Fat Layer (Subcutaneous Tissue) Exposed: Yes Tendon Exposed: No Muscle Exposed: No Joint Exposed: No Bone Exposed: No Electronic Signature(s) Signed: 05/02/2020 3:08:46 PM By: Lenda Kelp PA-C Signed: 05/03/2020 12:58:49 PM By: Elliot Gurney, BSN, RN, CWS, Kim RN, BSN Entered By: Lenda Kelp on  05/02/2020 15:08:46 Vanbuskirk, [name] ([id number]) -------------------------------------------------------------------------------- Vitals Details Patient Name: Ewing Schlein Date of Service: 05/02/2020 2:00 PM Medical Record Number: [id number] Patient Account Number: [id number] Date of Birth/Sex: 06/11/1927 (87 y.o. F) Treating RN: Rogers Blocker Primary Care Ramsey Midgett: Ellwood Dense Other Clinician: Referring Anayiah Howden: Ellwood Dense Treating Sharmin Foulk/Extender: Larina Bras,  Hoyt Weeks in Treatment: 0 Vital Signs Time Taken: 14:05 Temperature (F): 97.9 Pulse (bpm): 63 Respiratory Rate (breaths/min): 18 Blood Pressure (mmHg): 121/42 Reference Range: 80 - 120 mg / dl Electronic Signature(s) Signed: 05/02/2020 4:36:17 PM By: Dayton Martes RCP, RRT, CHT Entered By: Dayton Martes on 05/02/2020 14:12:43

## 2020-05-03 NOTE — Progress Notes (Signed)
DYNASTIE, KNOOP ([id number]) Visit Report for 05/02/2020 Abuse/Suicide Risk Screen Details Patient Name: [name], [name]. Date of Service: 05/02/2020 2:00 PM Medical Record Number: [id number] Patient Account Number: [id number] Date of Birth/Sex: 03-04-1928 (87 y.o. F) Treating RN: Huel Coventry Primary Care Aleana Fifita: Ellwood Dense Other Clinician: Referring Elika Godar: Ellwood Dense Treating Mayan Kloepfer/Extender: Rowan Blase in Treatment: 0 Abuse/Suicide Risk Screen Items Answer ABUSE RISK SCREEN: Has anyone close to you tried to hurt or harm you recentlyo No Do you feel uncomfortable with anyone in your familyo No Has anyone forced you do things that you didnot want to doo No Electronic Signature(s) Signed: 05/03/2020 12:58:49 PM By: Elliot Gurney, BSN, RN, CWS, Kim RN, BSN Entered By: Elliot Gurney, BSN, RN, CWS, Kim on 05/02/2020 14:24:33 Bugge, [name] ([id number]) -------------------------------------------------------------------------------- Activities of Daily Living Details Patient Name: [name], [name]. Date of Service: 05/02/2020 2:00 PM Medical Record Number: [id number] Patient Account Number: [id number] Date of Birth/Sex: 07/15/1927 (87 y.o. F) Treating RN: Huel Coventry Primary Care Rily Nickey: Ellwood Dense Other Clinician: Referring Shayna Eblen: Ellwood Dense Treating Jamall Strohmeier/Extender: Rowan Blase in Treatment: 0 Activities of Daily Living Items Answer Activities of Daily Living (Please select one for each item) Drive Automobile Not Able Take Medications Need Assistance Use Telephone Need Assistance Care for Appearance Need Assistance Use Toilet Need Environmental health practitioner / Shower Need Assistance Dress Self Need Assistance Feed Self Need Assistance Walk Need Assistance Get In / Out Bed Need Assistance Housework Need Assistance Prepare Meals Need Assistance Handle Money Need Assistance Shop for Self Need Assistance Electronic Signature(s) Signed:  05/03/2020 12:58:49 PM By: Elliot Gurney, BSN, RN, CWS, Kim RN, BSN Entered By: Elliot Gurney, BSN, RN, CWS, Kim on 05/02/2020 14:24:45 Bewick, [name] ([id number]) -------------------------------------------------------------------------------- Education Screening Details Patient Name: [name], [name] Date of Service: 05/02/2020 2:00 PM Medical Record Number: [id number] Patient Account Number: [id number] Date of Birth/Sex: 29-Jun-1927 (87 y.o. F) Treating RN: Huel Coventry Primary Care Daniele Yankowski: Ellwood Dense Other Clinician: Referring Aniqa Hare: Ellwood Dense Treating Kayven Aldaco/Extender: Rowan Blase in Treatment: 0 Primary Learner Assessed: Caregiver Reason Patient is not Primary Learner: dementia Learning Preferences/Education Level/Primary Language Learning Preference: Explanation, Demonstration Highest Education Level: College or Above Preferred Language: English Cognitive Barrier Language Barrier: No Translator Needed: No Memory Deficit: No Emotional Barrier: No Physical Barrier Impaired Vision: No Impaired Hearing: No Decreased Hand dexterity: No Knowledge/Comprehension Knowledge Level: High Comprehension Level: High Ability to understand written instructions: High Ability to understand verbal instructions: High Motivation Anxiety Level: Calm Cooperation: Cooperative Education Importance: Acknowledges Need Interest in Health Problems: Asks Questions Perception: Coherent Willingness to Engage in Self-Management High Activities: Readiness to Engage in Self-Management High Activities: Electronic Signature(s) Signed: 05/03/2020 12:58:49 PM By: Elliot Gurney, BSN, RN, CWS, Kim RN, BSN Entered By: Elliot Gurney, BSN, RN, CWS, Kim on 05/02/2020 14:25:18 Schou, [name] ([id number]) -------------------------------------------------------------------------------- Fall Risk Assessment Details Patient Name: [name] Date of Service: 05/02/2020 2:00 PM Medical Record  Number: [id number] Patient Account Number: [id number] Date of Birth/Sex: July 09, 1927 (87 y.o. F) Treating RN: Huel Coventry Primary Care Dashonda Bonneau: Ellwood Dense Other Clinician: Referring Frisco Cordts: Ellwood Dense Treating Rainy Rothman/Extender: Rowan Blase in Treatment: 0 Fall Risk Assessment Items Have you had 2 or more falls in the last 12 monthso 0 Yes Have you had any fall that resulted in injury in the last 12 monthso 0 Yes FALLS RISK SCREEN History of falling - immediate or within 3 months 0 No Secondary diagnosis (Do you have 2 or more medical diagnoseso) 15 Yes Ambulatory aid None/bed rest/wheelchair/nurse 0 No  Crutches/cane/walker 15 Yes Furniture 0 No Intravenous therapy Access/Saline/Heparin Lock 0 No Gait/Transferring Normal/ bed rest/ wheelchair 0 No Weak (short steps with or without shuffle, stooped but able to lift head while walking, may 0 No seek support from furniture) Impaired (short steps with shuffle, may have difficulty arising from chair, head down, impaired 20 Yes balance) Mental Status Oriented to own ability 0 No Electronic Signature(s) Signed: 05/03/2020 12:58:49 PM By: Elliot Gurney, BSN, RN, CWS, Kim RN, BSN Entered By: Elliot Gurney, BSN, RN, CWS, Kim on 05/02/2020 14:26:03 Sheehy, [name] ([id number]) -------------------------------------------------------------------------------- Foot Assessment Details Patient Name: [name]. Date of Service: 05/02/2020 2:00 PM Medical Record Number: [id number] Patient Account Number: [id number] Date of Birth/Sex: May 20, 1927 (87 y.o. F) Treating RN: Huel Coventry Primary Care Sabre Romberger: Ellwood Dense Other Clinician: Referring Mishka Stegemann: Ellwood Dense Treating Yazmina Pareja/Extender: Rowan Blase in Treatment: 0 Foot Assessment Items Site Locations + = Sensation present, - = Sensation absent, C = Callus, U = Ulcer R = Redness, W = Warmth, M = Maceration, PU = Pre-ulcerative lesion F = Fissure, S = Swelling,  D = Dryness Assessment Right: Left: Other Deformity: No No Prior Foot Ulcer: No No Prior Amputation: No No Charcot Joint: No No Ambulatory Status: Ambulatory With Help Assistance Device: Wheelchair Gait: Electronic Signature(s) Signed: 05/03/2020 12:58:49 PM By: Elliot Gurney, BSN, RN, CWS, Kim RN, BSN Entered By: Elliot Gurney, BSN, RN, CWS, Kim on 05/02/2020 14:26:51 Lanz, [name] ([id number]) -------------------------------------------------------------------------------- Nutrition Risk Screening Details Patient Name: [name], [name]. Date of Service: 05/02/2020 2:00 PM Medical Record Number: [id number] Patient Account Number: [id number] Date of Birth/Sex: 1927-04-11 (87 y.o. F) Treating RN: Huel Coventry Primary Care Ethlyn Alto: Ellwood Dense Other Clinician: Referring Cashlynn Yearwood: Ellwood Dense Treating Shanaye Rief/Extender: Rowan Blase in Treatment: 0 Height (in): Weight (lbs): Body Mass Index (BMI): Nutrition Risk Screening Items Score Screening NUTRITION RISK SCREEN: I have an illness or condition that made me change the kind and/or amount of food I eat 0 No I eat fewer than two meals per day 0 No I eat few fruits and vegetables, or milk products 0 No I have three or more drinks of beer, liquor or wine almost every day 0 No I have tooth or mouth problems that make it hard for me to eat 0 No I don't always have enough money to buy the food I need 0 No I eat alone most of the time 0 No I take three or more different prescribed or over-the-counter drugs a day 0 No Without wanting to, I have lost or gained 10 pounds in the last six months 0 No I am not always physically able to shop, cook and/or feed myself 0 No Nutrition Protocols Good Risk Protocol Moderate Risk Protocol 0 Provide education on nutrition High Risk Proctocol Risk Level: Good Risk Score: 0 Electronic Signature(s) Signed: 05/03/2020 12:58:49 PM By: Elliot Gurney, BSN, RN, CWS, Kim RN, BSN Entered By: Elliot Gurney, BSN, RN,  CWS, Kim on 05/02/2020 14:26:39

## 2020-05-20 ENCOUNTER — Other Ambulatory Visit: Payer: Self-pay

## 2020-05-20 DIAGNOSIS — G301 Alzheimer's disease with late onset: Secondary | ICD-10-CM

## 2020-05-20 DIAGNOSIS — F0280 Dementia in other diseases classified elsewhere without behavioral disturbance: Secondary | ICD-10-CM

## 2020-05-20 MED ORDER — MEMANTINE HCL 10 MG PO TABS: 10.0000 mg | ORAL_TABLET | Freq: Two times a day (BID) | ORAL | 1 refills | Status: AC

## 2020-05-23 ENCOUNTER — Ambulatory Visit: Payer: Medicare Other | Admitting: Physician Assistant

## 2020-07-01 ENCOUNTER — Other Ambulatory Visit: Payer: Self-pay | Admitting: Family Medicine

## 2020-07-01 DIAGNOSIS — I10 Essential (primary) hypertension: Secondary | ICD-10-CM

## 2020-07-01 MED ORDER — FUROSEMIDE 20 MG PO TABS
20.0000 mg | ORAL_TABLET | Freq: Every day | ORAL | 0 refills | Status: DC
Start: 2020-07-01 — End: 2020-07-29

## 2020-07-01 MED ORDER — METOPROLOL SUCCINATE ER 25 MG PO TB24: ORAL_TABLET | ORAL | 0 refills | Status: DC

## 2020-07-01 NOTE — Telephone Encounter (Unsigned)
Copied from CRM [id number]. Topic: Quick Communication - Rx Refill/Question >> Jul 01, 2020  9:35 AM Gaetana Michaelis A wrote: Medication: metoprolol succinate (TOPROL-XL) 25 MG 24 hr tablet furosemide (LASIX) 20 MG tablet   Has the patient contacted their pharmacy? Yes. Patient's daughter has contacted the pharmacy and been directed to contact patient's PCP  Preferred Pharmacy (with phone number or street name): [facility] [place], [place] - [address]  Phone:  [phone] Fax:  [phone]  Agent: Please be advised that RX refills may take up to 3 business days. We ask that you follow-up with your pharmacy.

## 2020-07-01 NOTE — Telephone Encounter (Signed)
Notes to clinic: Review for refill  Last seen by Ellwood Dense   Requested Prescriptions  Pending Prescriptions Disp Refills   metoprolol succinate (TOPROL-XL) 25 MG 24 hr tablet 270 tablet 1    Sig: TAKE 1 TABLET BY MOUTH IN THE MORNING AND 2 NIGHTLY      Cardiovascular:  Beta Blockers Passed - 07/01/2020  9:41 AM      Passed - Last BP in normal range    BP Readings from Last 1 Encounters:  04/30/20 (!) 120/50          Passed - Last Heart Rate in normal range    Pulse Readings from Last 1 Encounters:  04/30/20 (!) 56          Passed - Valid encounter within last 6 months    Recent Outpatient Visits           2 months ago Need for 23-polyvalent pneumococcal polysaccharide vaccine   [facility] Caro Laroche, DO   3 months ago Mixed hyperlipidemia   [facility] Jamelle Haring, MD   5 months ago Encounter for examination following treatment at hospital   [facility] Jamelle Haring, MD   5 months ago Leg swelling   [facility] Welford Roche D, MD   8 months ago Alzheimer's dementia without behavioral disturbance, unspecified timing of dementia onset [facility])   [facility] Welford Roche D, MD                  furosemide (LASIX) 20 MG tablet 30 tablet 0    Sig: Take 1 tablet (20 mg total) by mouth daily.      Cardiovascular:  Diuretics - Loop Failed - 07/01/2020  9:41 AM      Failed - Ca in normal range and within 360 days    Calcium  Date Value Ref Range Status  01/20/2020 8.7 (L) 8.9 - 10.3 mg/dL Final   Calcium, Total  Date Value Ref Range Status  12/23/2011 9.5 8.5 - 10.1 mg/dL Final          Passed - K in normal range and within 360 days    Potassium  Date Value Ref Range Status  01/20/2020 3.5 3.5 - 5.1 mmol/L Final  12/23/2011 4.2 3.5 - 5.1 mmol/L Final          Passed - Na in normal range and within 360 days     Sodium  Date Value Ref Range Status  01/20/2020 135 135 - 145 mmol/L Final  12/23/2011 143 136 - 145 mmol/L Final          Passed - Cr in normal range and within 360 days    Creat  Date Value Ref Range Status  05/05/2019 0.74 0.60 - 0.88 mg/dL Final    Comment:    For patients >13 years of age, the reference limit for Creatinine is approximately 13% higher for people identified as African-American. .    Creatinine, Ser  Date Value Ref Range Status  01/20/2020 0.51 0.44 - 1.00 mg/dL Final          Passed - Last BP in normal range    BP Readings from Last 1 Encounters:  04/30/20 (!) 120/50          Passed - Valid encounter within last 6 months    Recent Outpatient Visits           2 months ago Need  for 23-polyvalent pneumococcal polysaccharide vaccine   [facility] Caro Laroche, DO   3 months ago Mixed hyperlipidemia   [facility] Jamelle Haring, MD   5 months ago Encounter for examination following treatment at hospital   [facility] Jamelle Haring, MD   5 months ago Leg swelling   [facility] Welford Roche D, MD   8 months ago Alzheimer's dementia without behavioral disturbance, unspecified timing of dementia onset [facility])   [facility] Jamelle Haring, MD

## 2020-07-01 NOTE — Telephone Encounter (Signed)
Pts last appt was 04/30/20

## 2020-07-01 NOTE — Telephone Encounter (Signed)
Appt made

## 2020-07-02 ENCOUNTER — Other Ambulatory Visit: Payer: Self-pay

## 2020-07-02 ENCOUNTER — Encounter: Payer: Medicare Other | Attending: Physician Assistant | Admitting: Physician Assistant

## 2020-07-02 DIAGNOSIS — F0150 Vascular dementia without behavioral disturbance: Secondary | ICD-10-CM | POA: Diagnosis not present

## 2020-07-02 DIAGNOSIS — Z880 Allergy status to penicillin: Secondary | ICD-10-CM | POA: Diagnosis not present

## 2020-07-02 DIAGNOSIS — L89153 Pressure ulcer of sacral region, stage 3: Secondary | ICD-10-CM | POA: Insufficient documentation

## 2020-07-02 DIAGNOSIS — I110 Hypertensive heart disease with heart failure: Secondary | ICD-10-CM | POA: Diagnosis not present

## 2020-07-02 DIAGNOSIS — Z86718 Personal history of other venous thrombosis and embolism: Secondary | ICD-10-CM | POA: Diagnosis not present

## 2020-07-02 DIAGNOSIS — L89313 Pressure ulcer of right buttock, stage 3: Secondary | ICD-10-CM | POA: Diagnosis not present

## 2020-07-02 DIAGNOSIS — I509 Heart failure, unspecified: Secondary | ICD-10-CM | POA: Diagnosis not present

## 2020-07-02 DIAGNOSIS — Z881 Allergy status to other antibiotic agents status: Secondary | ICD-10-CM | POA: Diagnosis not present

## 2020-07-10 NOTE — Progress Notes (Signed)
[name], [name] ([id number]) Visit Report for 07/02/2020 Allergy List Details Patient Name: [name], [name]. Date of Service: 07/02/2020 2:45 PM Medical Record Number: [id number] Patient Account Number: [id number] Date of Birth/Sex: 02-Apr-1927 (87 y.o. F) Treating RN: Carlene Coria Primary Care Toryn Dewalt: Rory Percy Other Clinician: Jeanine Luz Referring Jedrek Dinovo: Rory Percy Treating Alexsandria Kivett/Extender: Skipper Cliche in Treatment: 0 Allergies Active Allergies No Known Drug Allergies Allergy Notes Electronic Signature(s) Signed: 07/10/2020 3:51:10 PM By: Carlene Coria RN Entered By: Carlene Coria on 07/02/2020 14:47:23 [name], [name] ([id number]) -------------------------------------------------------------------------------- Arrival Information Details Patient Name: [name] Date of Service: 07/02/2020 2:45 PM Medical Record Number: [id number] Patient Account Number: [id number] Date of Birth/Sex: 1927-08-08 (87 y.o. F) Treating RN: Carlene Coria Primary Care Brendin Situ: Rory Percy Other Clinician: Jeanine Luz Referring Garris Melhorn: Rory Percy Treating Frederick Klinger/Extender: Skipper Cliche in Treatment: 0 Visit Information Patient Arrived: Wheel Chair Arrival Time: 14:37 Accompanied By: daughter Transfer Assistance: EasyPivot Patient Lift Patient Identification Verified: Yes Secondary Verification Process Completed: Yes Patient Has Alerts: Yes Patient Alerts: Eliquis STOPPED 04/16/20 History Since Last Visit Added or deleted any medications: Yes Had a fall or experienced change in activities of daily living that may affect risk of falls: No Hospitalized since last visit: Yes Electronic Signature(s) Signed: 07/10/2020 3:51:10 PM By: Carlene Coria RN Entered By: Carlene Coria on 07/02/2020 14:45:29 [name], [name] ([id number]) -------------------------------------------------------------------------------- Clinic Level of Care  Assessment Details Patient Name: [name], [name]. Date of Service: 07/02/2020 2:45 PM Medical Record Number: [id number] Patient Account Number: [id number] Date of Birth/Sex: [birth year] (87 y.o. F) Treating RN: Dolan Amen Primary Care Taisa Deloria: Rory Percy Other Clinician: Jeanine Luz Referring Conlan Miceli: Rory Percy Treating Marcello Tuzzolino/Extender: Skipper Cliche in Treatment: 0 Clinic Level of Care Assessment Items TOOL 2 Quantity Score X - Use when only an EandM is performed on the INITIAL visit 1 0 ASSESSMENTS - Nursing Assessment / Reassessment X - General Physical Exam (combine w/ comprehensive assessment (listed just below) when performed on new 1 20 pt. evals) X- 1 25 Comprehensive Assessment (HX, ROS, Risk Assessments, Wounds Hx, etc.) ASSESSMENTS - Wound and Skin Assessment / Reassessment _0  - Simple Wound Assessment / Reassessment - one wound 0 X- 2 5 Complex Wound Assessment / Reassessment - multiple wounds _1  - 0 Dermatologic / Skin Assessment (not related to wound area) ASSESSMENTS - Ostomy and/or Continence Assessment and Care _2  - Incontinence Assessment and Management 0 _3  - 0 Ostomy Care Assessment and Management (repouching, etc.) PROCESS - Coordination of Care X - Simple Patient / Family Education for ongoing care 1 15 _4  - 0 Complex (extensive) Patient / Family Education for ongoing care X- 1 10 Staff obtains Programmer, systems, Records, Test Results / Process Orders _5  - 0 Staff telephones HHA, Nursing Homes / Clarify orders / etc _6  - 0 Routine Transfer to another Facility (non-emergent condition) _7  - 0 Routine Hospital Admission (non-emergent condition) _8  - 0 New Admissions / Biomedical engineer / Ordering NPWT, Apligraf, etc. _9  - 0 Emergency Hospital Admission (emergent condition) X- 1 10 Simple Discharge Coordination _10  - 0 Complex (extensive) Discharge Coordination PROCESS - Special Needs _11  - Pediatric / Minor Patient  Management 0 _12  - 0 Isolation Patient Management _13  - 0 Hearing / Language / Visual special needs _14  - 0 Assessment of Community assistance (transportation, D/C planning, etc.) _15  - 0 Additional assistance / Altered mentation _16  - 0 Support Surface(s) Assessment (bed, cushion, seat, etc.) INTERVENTIONS - Wound Cleansing / Measurement X - Wound Imaging (photographs -  any number of wounds) 1 5 _0  - 0 Wound Tracing (instead of photographs) _1  - 0 Simple Wound Measurement - one wound X- 2 5 Complex Wound Measurement - multiple wounds [name], Deborah N. ([id number]) _2  - 0 Simple Wound Cleansing - one wound X- 2 5 Complex Wound Cleansing - multiple wounds INTERVENTIONS - Wound Dressings _3  - Small Wound Dressing one or multiple wounds 0 X- 1 15 Medium Wound Dressing one or multiple wounds _4  - 0 Large Wound Dressing one or multiple wounds <[id number]>_0<\/[id number]>_8  - 0 Application of Medications - injection INTERVENTIONS - Miscellaneous _6  - External ear exam 0 _7  - 0 Specimen Collection (cultures, biopsies, blood, body fluids, etc.) _8  - 0 Specimen(s) / Culture(s) sent or taken to Lab for analysis _9  - 0 Patient Transfer (multiple staff / Civil Service fast streamer / Similar devices) _10  - 0 Simple Staple / Suture removal (25 or less) _11  - 0 Complex Staple / Suture removal (26 or more) _12  - 0 Hypo / Hyperglycemic Management (close monitor of Blood Glucose) _13  - 0 Ankle / Brachial Index (ABI) - do not check if billed separately Has the patient been seen at the hospital within the last three years: Yes Total Score: 130 Level Of Care: New/Established - Level 4 Electronic Signature(s) Signed: 07/02/2020 5:05:23 PM By: Georges Mouse, Minus Breeding RN Entered By: Georges Mouse, Minus Breeding on 07/02/2020 16:12:05 [name], [name] ([id number]) -------------------------------------------------------------------------------- Encounter Discharge Information Details Patient Name: [name] Date of Service:  07/02/2020 2:45 PM Medical Record Number: [id number] Patient Account Number: [id number] Date of Birth/Sex: December 07, 1927 (87 y.o. F) Treating RN: Dolan Amen Primary Care Oluwadarasimi Redmon: Rory Percy Other Clinician: Jeanine Luz Referring Liara Holm: Rory Percy Treating Ethelyn Cerniglia/Extender: Skipper Cliche in Treatment: 0 Encounter Discharge Information Items Post Procedure Vitals Discharge Condition: Stable Temperature (F): 97.6 Ambulatory Status: Wheelchair Pulse (bpm): 67 Discharge Destination: Home Respiratory Rate (breaths/min): 16 Transportation: Private Auto Blood Pressure (mmHg): 135/56 Accompanied By: daughter Schedule Follow-up Appointment: Yes Clinical Summary of Care: Electronic Signature(s) Signed: 07/02/2020 4:36:03 PM By: Jeanine Luz Entered By: Jeanine Luz on 07/02/2020 16:36:03 [place], [name] ([id number]) -------------------------------------------------------------------------------- Lower Extremity Assessment Details Patient Name: [name] Date of Service: 07/02/2020 2:45 PM Medical Record Number: [id number] Patient Account Number: [id number] Date of Birth/Sex: 1927-07-09 (87 y.o. F) Treating RN: Carlene Coria Primary Care Kiyana Vazguez: Rory Percy Other Clinician: Jeanine Luz Referring Tenishia Ekman: Rory Percy Treating Claramae Rigdon/Extender: Skipper Cliche in Treatment: 0 Electronic Signature(s) Signed: 07/10/2020 3:51:10 PM By: Carlene Coria RN Entered By: Carlene Coria on 07/02/2020 14:51:13 [name], [name] ([id number]) -------------------------------------------------------------------------------- Multi Wound Chart Details Patient Name: [name]. Date of Service: 07/02/2020 2:45 PM Medical Record Number: [id number] Patient Account Number: [id number] Date of Birth/Sex: [birth year] (87 y.o. F) Treating RN: Dolan Amen Primary Care Krystl Wickware: Rory Percy Other Clinician: Jeanine Luz Referring  Emiliana Blaize: Rory Percy Treating Galen Russman/Extender: Skipper Cliche in Treatment: 0 Vital Signs Height(in): Pulse(bpm): 81 Weight(lbs): 134 Blood Pressure(mmHg): 135/56 Body Mass Index(BMI): Temperature(F): 97.6 Respiratory Rate(breaths/min): 16 Photos: [N/A:N/A] Wound Location: Sacrum Right Gluteus N/A Wounding Event: Gradually Appeared Gradually Appeared N/A Primary Etiology: Pressure Ulcer Pressure Ulcer N/A Comorbid History: Cataracts, Arrhythmia, Congestive Cataracts, Arrhythmia, Congestive N/A Heart Failure, Hypertension, Heart Failure, Hypertension, Dementia Dementia Date Acquired: 06/07/2020 06/14/2020 N/A Weeks of Treatment: 0 0 N/A Wound Status: Open Open N/A Measurements L x W x D (cm) 2.4x1x0.7 0.6x1x0.2 N/A Area (cm) : 1.885 0.471 N/A Volume (cm) : 1.319 0.094 N/A % Reduction in Area: 0.00% 0.00% N/A % Reduction in Volume: 0.00%  0.00% N/A Starting Position 1 (o'clock): 1 Ending Position 1 (o'clock): 12 Maximum Distance 1 (cm): 0.7 Undermining: Yes No N/A Classification: Unstageable/Unclassified Category/Stage III N/A Exudate Amount: Medium Medium N/A Exudate Type: Serosanguineous Sanguinous N/A Exudate Color: red, brown red N/A Wound Margin: N/A Flat and Intact N/A Granulation Amount: None Present (0%) Large (67-100%) N/A Granulation Quality: N/A Red N/A Necrotic Amount: Large (67-100%) None Present (0%) N/A Exposed Structures: Fat Layer (Subcutaneous Tissue): Fat Layer (Subcutaneous Tissue): N/A Yes Yes Fascia: No Fascia: No Tendon: No Tendon: No Muscle: No Muscle: No Joint: No Joint: No Bone: No Bone: No Treatment Notes Electronic Signature(s) Signed: 07/02/2020 5:05:23 PM By: Georges Mouse, Minus Breeding RN Entered By: Georges Mouse, Kenia on 07/02/2020 16:04:30 [name], [name] ([id number]) [place], [name] ([id number]) -------------------------------------------------------------------------------- [place]  Details Patient Name: [name] Date of Service: 07/02/2020 2:45 PM Medical Record Number: [id number] Patient Account Number: [id number] Date of Birth/Sex: 1927/11/16 (87 y.o. F) Treating RN: Dolan Amen Primary Care Ellarie Picking: Rory Percy Other Clinician: Jeanine Luz Referring Frutoso Dimare: Rory Percy Treating Vicent Febles/Extender: Skipper Cliche in Treatment: 0 Active Inactive Pressure Nursing Diagnoses: Knowledge deficit related to causes and risk factors for pressure ulcer development Knowledge deficit related to management of pressures ulcers Goals: Patient will remain free from development of additional pressure ulcers Date Initiated: 07/02/2020 Target Resolution Date: 08/01/2020 Goal Status: Active Patient will remain free of pressure ulcers Date Initiated: 07/02/2020 Target Resolution Date: 08/01/2020 Goal Status: Active Patient/caregiver will verbalize risk factors for pressure ulcer development Date Initiated: 07/02/2020 Target Resolution Date: 07/02/2020 Goal Status: Active Patient/caregiver will verbalize understanding of pressure ulcer management Date Initiated: 07/02/2020 Target Resolution Date: 07/02/2020 Goal Status: Active Interventions: Assess: immobility, friction, shearing, incontinence upon admission and as needed Assess offloading mechanisms upon admission and as needed Assess potential for pressure ulcer upon admission and as needed Provide education on pressure ulcers Notes: Wound/Skin Impairment Nursing Diagnoses: Impaired tissue integrity Goals: Patient/caregiver will verbalize understanding of skin care regimen Date Initiated: 07/02/2020 Target Resolution Date: 07/02/2020 Goal Status: Active Ulcer/skin breakdown will have a volume reduction of 30% by week 4 Date Initiated: 07/02/2020 Target Resolution Date: 08/01/2020 Goal Status: Active Ulcer/skin breakdown will have a volume reduction of 50% by week 8 Date Initiated:  07/02/2020 Target Resolution Date: 09/01/2020 Goal Status: Active Ulcer/skin breakdown will have a volume reduction of 80% by week 12 Date Initiated: 07/02/2020 Target Resolution Date: 10/01/2020 Goal Status: Active Ulcer/skin breakdown will heal within 14 weeks Date Initiated: 07/02/2020 Target Resolution Date: 11/01/2020 Goal Status: Active Interventions: Assess patient/caregiver ability to obtain necessary supplies Assess patient/caregiver ability to perform ulcer/skin care regimen upon admission and as needed [name], [name] ([id number]) Assess ulceration(s) every visit Provide education on ulcer and skin care Treatment Activities: Referred to DME Vinh Sachs for dressing supplies : 07/02/2020 Skin care regimen initiated : 07/02/2020 Notes: Electronic Signature(s) Signed: 07/02/2020 5:05:23 PM By: Georges Mouse, Minus Breeding RN Entered By: Georges Mouse, Kenia on 07/02/2020 [place], [name] ([id number]) -------------------------------------------------------------------------------- Pain Assessment Details Patient Name: [name] Date of Service: 07/02/2020 2:45 PM Medical Record Number: [id number] Patient Account Number: [id number] Date of Birth/Sex: Jan 28, 1928 (87 y.o. F) Treating RN: Carlene Coria Primary Care Aven Cegielski: Rory Percy Other Clinician: Jeanine Luz Referring Jakara Blatter: Rory Percy Treating Luiza Carranco/Extender: Skipper Cliche in Treatment: 0 Active Problems Location of Pain Severity and Description of Pain Patient Has Paino No Site Locations Rate the pain. Current Pain Level: 0 Pain Management and Medication Current Pain Management: Notes states none at  this time Electronic Signature(s) Signed: 07/10/2020 3:51:10 PM By: Carlene Coria RN Entered By: Carlene Coria on 07/02/2020 14:45:59 [name], [name] ([id number]) -------------------------------------------------------------------------------- Patient/Caregiver Education  Details Patient Name: [name] Date of Service: 07/02/2020 2:45 PM Medical Record Number: [id number] Patient Account Number: [id number] Date of Birth/Gender: 1928/01/06 (87 y.o. F) Treating RN: Dolan Amen Primary Care Physician: Rory Percy Other Clinician: Jeanine Luz Referring Physician: Rory Percy Treating Physician/Extender: Skipper Cliche in Treatment: 0 Education Assessment Education Provided To: Patient Education Topics Provided Pressure: Methods: Explain/Verbal Responses: State content correctly Wound/Skin Impairment: Methods: Explain/Verbal Responses: State content correctly Electronic Signature(s) Signed: 07/02/2020 5:05:23 PM By: Georges Mouse, Minus Breeding RN Entered By: Georges Mouse, Minus Breeding on 07/02/2020 16:12:28 [place], [name] ([id number]) -------------------------------------------------------------------------------- Wound Assessment Details Patient Name: [name]. Date of Service: 07/02/2020 2:45 PM Medical Record Number: [id number] Patient Account Number: [id number] Date of Birth/Sex: [birth year] (87 y.o. F) Treating RN: Carlene Coria Primary Care Timmie Calix: Rory Percy Other Clinician: Jeanine Luz Referring Elysha Daw: Rory Percy Treating Kortne All/Extender: Skipper Cliche in Treatment: 0 Wound Status Wound Number: 4 Primary Pressure Ulcer Etiology: Wound Location: Sacrum Wound Status: Open Wounding Event: Gradually Appeared Comorbid Cataracts, Arrhythmia, Congestive Heart Failure, Date Acquired: 06/07/2020 History: Hypertension, Dementia Weeks Of Treatment: 0 Clustered Wound: No Photos Wound Measurements Length: (cm) 2.4 % Reduc Width: (cm) 1 % Reduc Depth: (cm) 0.7 Tunneli Area: (cm) 1.885 Underm Volume: (cm) 1.319 Star Endin Maxim tion in Area: 0% tion in Volume: 0% ng: No ining: Yes ting Position (o'clock): 1 g Position (o'clock): 12 um Distance: (cm) 0.7 Wound  Description Classification: Unstageable/Unclassified Foul O Exudate Amount: Medium Slough Exudate Type: Serosanguineous Exudate Color: red, brown dor After Cleansing: No /Fibrino Yes Wound Bed Granulation Amount: None Present (0%) Exposed Structure Necrotic Amount: Large (67-100%) Fascia Exposed: No Necrotic Quality: Adherent Slough Fat Layer (Subcutaneous Tissue) Exposed: Yes Tendon Exposed: No Muscle Exposed: No Joint Exposed: No Bone Exposed: No Treatment Notes Wound #4 (Sacrum) Cleanser [place] Ancillary Kit - 15 Day Supply CLARENE, CURRAN ([id number]) Discharge Instruction: Use supplies as instructed; Kit contains: (15) Saline Bullets; (15) 3x3 Gauze; 15 pr Gloves Normal Saline Discharge Instruction: Wash your hands with soap and water. Remove old dressing, discard into plastic bag and place into trash. Cleanse the wound with Normal Saline prior to applying a clean dressing using gauze sponges, not tissues or cotton balls. Do not scrub or use excessive force. Pat dry using gauze sponges, not tissue or cotton balls. Peri-Wound Care Topical Primary Dressing Gauze Discharge Instruction: Moisten with Dakin's solution, pack in wound Secondary Dressing ABD Pad 5x9 (in/in) Discharge Instruction: Cover with ABD pad Secured With 10M Medipore H Soft Cloth Surgical Tape, 2x2 (in/yd) Discharge Instruction: Secure ABD Compression Wrap Compression Stockings Add-Ons Electronic Signature(s) Signed: 07/02/2020 5:05:23 PM By: Georges Mouse, Minus Breeding RN Signed: 07/10/2020 3:51:10 PM By: Carlene Coria RN Entered By: Georges Mouse, Minus Breeding on 07/02/2020 16:02:19 [place], [name] ([id number]) -------------------------------------------------------------------------------- Wound Assessment Details Patient Name: [name]. Date of Service: 07/02/2020 2:45 PM Medical Record Number: [id number] Patient Account Number: [id number] Date of Birth/Sex: 1927/11/02 (87 y.o.  F) Treating RN: Carlene Coria Primary Care Rheta Hemmelgarn: Rory Percy Other Clinician: Jeanine Luz Referring Sharlyn Odonnel: Rory Percy Treating Ifrah Vest/Extender: Skipper Cliche in Treatment: 0 Wound Status Wound Number: 5 Primary Pressure Ulcer Etiology: Wound Location: Right Gluteus Wound Status: Open Wounding Event: Gradually Appeared Comorbid Cataracts, Arrhythmia, Congestive Heart Failure, Date Acquired: 06/14/2020 History: Hypertension, Dementia Weeks Of Treatment: 0 Clustered Wound: No Photos Wound  Measurements Length: (cm) 0.6 Width: (cm) 1 Depth: (cm) 0.2 Area: (cm) 0.471 Volume: (cm) 0.094 % Reduction in Area: 0% % Reduction in Volume: 0% Tunneling: No Undermining: No Wound Description Classification: Category/Stage III Wound Margin: Flat and Intact Exudate Amount: Medium Exudate Type: Sanguinous Exudate Color: red Foul Odor After Cleansing: No Slough/Fibrino No Wound Bed Granulation Amount: Large (67-100%) Exposed Structure Granulation Quality: Red Fascia Exposed: No Necrotic Amount: None Present (0%) Fat Layer (Subcutaneous Tissue) Exposed: Yes Tendon Exposed: No Muscle Exposed: No Joint Exposed: No Bone Exposed: No Treatment Notes Wound #5 (Gluteus) Wound Laterality: Right Cleanser Normal Saline Discharge Instruction: Wash your hands with soap and water. Remove old dressing, discard into plastic bag and place into trash. Cleanse the wound with Normal Saline prior to applying a clean dressing using gauze sponges, not tissues or cotton balls. Do not scrub or use excessive force. Pat dry using gauze sponges, not tissue or cotton balls. MADDI, COLLAR ([id number]) Peri-Wound Care Topical Primary Dressing Silvercel Small 2x2 (in/in) Discharge Instruction: Apply Silvercel Small 2x2 (in/in) as instructed Secondary Dressing ABD Pad 5x9 (in/in) Discharge Instruction: Cover with ABD pad Secured With 44M [place] Surgical Tape,  2x2 (in/yd) Discharge Instruction: Secure ABD Compression Wrap Compression Stockings Add-Ons Electronic Signature(s) Signed: 07/02/2020 5:05:23 PM By: Georges Mouse, Minus Breeding RN Signed: 07/10/2020 3:51:10 PM By: Carlene Coria RN Entered By: Georges Mouse, Minus Breeding on 07/02/2020 16:02:40 [place], [name] ([id number]) -------------------------------------------------------------------------------- [place] Details Patient Name: [name] Date of Service: 07/02/2020 2:45 PM Medical Record Number: [id number] Patient Account Number: [id number] Date of Birth/Sex: 1928-03-10 (87 y.o. F) Treating RN: Carlene Coria Primary Care Virginie Josten: Rory Percy Other Clinician: Jeanine Luz Referring Shanele Nissan: Rory Percy Treating Zeola Brys/Extender: Skipper Cliche in Treatment: 0 Vital Signs Time Taken: 14:43 Temperature (F): 97.6 Weight (lbs): 134 Pulse (bpm): 67 Source: Measured Respiratory Rate (breaths/min): 16 Blood Pressure (mmHg): 135/56 Reference Range: 80 - 120 mg / dl Electronic Signature(s) Signed: 07/10/2020 3:51:10 PM By: Carlene Coria RN Entered By: Carlene Coria on 07/02/2020 14:46:47

## 2020-07-10 NOTE — Progress Notes (Signed)
TELEAH, VILLAMAR ([id number]) Visit Report for 07/02/2020 Chief Complaint Document Details Patient Name: [name], [name]. Date of Service: 07/02/2020 2:45 PM Medical Record Number: [id number] Patient Account Number: [id number] Date of Birth/Sex: 03/06/1928 (87 y.o. F) Treating RN: Dolan Amen Primary Care Provider: Rory Percy Other Clinician: Jeanine Luz Referring Provider: Rory Percy Treating Provider/Extender: Skipper Cliche in Treatment: 0 Information Obtained from: Patient Chief Complaint Gluteal and right ear ulcers Electronic Signature(s) Signed: 07/02/2020 3:14:08 PM By: Worthy Keeler PA-C Entered By: Worthy Keeler on 07/02/2020 15:14:08 [name], [name] ([id number]) -------------------------------------------------------------------------------- Debridement Details Patient Name: [name] Date of Service: 07/02/2020 2:45 PM Medical Record Number: [id number] Patient Account Number: [id number] Date of Birth/Sex: 10-Dec-1927 (87 y.o. F) Treating RN: Dolan Amen Primary Care Provider: Rory Percy Other Clinician: Jeanine Luz Referring Provider: Rory Percy Treating Provider/Extender: Skipper Cliche in Treatment: 0 Debridement Performed for Wound #4 Sacrum Assessment: Performed By: Physician Tommie Sams., PA-C Debridement Type: Chemical/Enzymatic/Mechanical Agent Used: gauze and saline Level of Consciousness (Pre- Awake and Alert procedure): Pre-procedure Verification/Time Out Yes - 16:07 Taken: Start Time: 16:07 Instrument: Other : saline and gauze Bleeding: Minimum Hemostasis Achieved: Pressure Response to Treatment: Procedure was tolerated well Level of Consciousness (Post- Awake and Alert procedure): Post Debridement Measurements of Total Wound Length: (cm) 2.4 Stage: Unstageable/Unclassified Width: (cm) 1 Depth: (cm) 0.7 Volume: (cm) 1.319 Character of Wound/Ulcer Post Debridement:  Stable Post Procedure Diagnosis Same as Pre-procedure Electronic Signature(s) Signed: 07/02/2020 5:05:23 PM By: Charlett Nose RN Signed: 07/03/2020 12:58:56 PM By: Worthy Keeler PA-C Entered By: Georges Mouse, Minus Breeding on 07/02/2020 16:08:08 [name], [name] ([id number]) -------------------------------------------------------------------------------- Debridement Details Patient Name: [name] Date of Service: 07/02/2020 2:45 PM Medical Record Number: [id number] Patient Account Number: [id number] Date of Birth/Sex: [birth year] (87 y.o. F) Treating RN: Dolan Amen Primary Care Provider: Rory Percy Other Clinician: Jeanine Luz Referring Provider: Rory Percy Treating Provider/Extender: Skipper Cliche in Treatment: 0 Debridement Performed for Wound #5 Right Gluteus Assessment: Performed By: Physician Tommie Sams., PA-C Debridement Type: Chemical/Enzymatic/Mechanical Agent Used: gauze and saline Level of Consciousness (Pre- Awake and Alert procedure): Pre-procedure Verification/Time Out Yes - 16:07 Taken: Start Time: 16:07 Instrument: Other : saline and gauze Bleeding: Minimum Hemostasis Achieved: Pressure Response to Treatment: Procedure was tolerated well Level of Consciousness (Post- Awake and Alert procedure): Post Debridement Measurements of Total Wound Length: (cm) 0.6 Stage: Category/Stage III Width: (cm) 1 Depth: (cm) 0.2 Volume: (cm) 0.094 Character of Wound/Ulcer Post Debridement: Stable Post Procedure Diagnosis Same as Pre-procedure Electronic Signature(s) Signed: 07/02/2020 4:36:48 PM By: Charlett Nose RN Signed: 07/03/2020 12:58:56 PM By: Worthy Keeler PA-C Entered By: Georges Mouse, Minus Breeding on 07/02/2020 16:36:48 [name], [name] ([id number]) -------------------------------------------------------------------------------- HPI Details Patient Name: [name] Date of Service:  07/02/2020 2:45 PM Medical Record Number: [id number] Patient Account Number: [id number] Date of Birth/Sex: [birth year] (87 y.o. F) Treating RN: Dolan Amen Primary Care Provider: Rory Percy Other Clinician: Jeanine Luz Referring Provider: Rory Percy Treating Provider/Extender: Skipper Cliche in Treatment: 0 History of Present Illness HPI Description: 05/02/2020 upon evaluation today patient appears to be doing somewhat poorly in regard to her gluteal region. She has moisture associated skin breakdown in this area there is a lot of irritation. With that being said unfortunately pretty much anything that is put on this area causes it to become irritated and the patient will apparently get her hands back there and pull off any dressings and mess with the wound  scratching a lot. With that being said I think this is the biggest issue at this point. There does not appear to be any signs of active infection which is good and again I do not see any signs of deep tissue injury. She has a small area on the posterior aspect of her auricle but again I think that is pretty much healed based on what I am seeing today. Patient does have dementia unfortunately she has congestive heart failure and all this began when she broke her hip and was in rehab in September 2021. 07/02/2020 patient appears to be doing poorly currently in regard to issues that she is having with continued moisture associated breakdown. There also appears to be some pressure related issues going on at this point as well. I previously saw her May 02, 2020. Unfortunately since that time her daughter tells me she has continued to sit quite frequently and in fact that is a big issue here as far as offloading is concerned she is not getting up and moving around as frequently as we would like to see. With that being said that has led to pressure related breakdown of the skin unfortunately. She does have altered mental status  this means that unfortunately secondary to the dementia she is really not perceiving what she needs to do for herself as far as being cautious and careful is concerned and allowing this to heal appropriately. Again this is the big struggle I think although her family seems to be doing a good job trying to change the dressings and trying to keep things under control here. Electronic Signature(s) Signed: 07/02/2020 5:49:53 PM By: Worthy Keeler PA-C Entered By: Worthy Keeler on 07/02/2020 17:49:53 Camacho, [name] ([id number]) -------------------------------------------------------------------------------- Physical Exam Details Patient Name: [name], [name]. Date of Service: 07/02/2020 2:45 PM Medical Record Number: [id number] Patient Account Number: [id number] Date of Birth/Sex: 10-18-1927 (87 y.o. F) Treating RN: Dolan Amen Primary Care Provider: Rory Percy Other Clinician: Jeanine Luz Referring Provider: Rory Percy Treating Provider/Extender: Skipper Cliche in Treatment: 0 Constitutional sitting or standing blood pressure is within target range for patient.. pulse regular and within target range for patient.Marland Kitchen respirations regular, non- labored and within target range for patient.Marland Kitchen temperature within target range for patient.. Well-nourished and well-hydrated in no acute distress. Eyes conjunctiva clear no eyelid edema noted. pupils equal round and reactive to light and accommodation. Ears, Nose, Mouth, and Throat no gross abnormality of ear auricles or external auditory canals. normal hearing noted during conversation. mucus membranes moist. Respiratory normal breathing without difficulty. Musculoskeletal Patient unable to walk without assistance. Psychiatric Patient is not able to cooperate in decision making regarding care. Patient has dementia. pleasant and cooperative. Notes Upon inspection patient's wound bed actually showed signs in the right  gluteal region of not being too poor at this time I think that is doing okay. With that being said unfortunately in regard to her wound in the sacral region this is broken down and there is definitely an unstageable finding here which is unfortunate and I do think that the patient is going to require some debridement although she is having a lot of pain I do not really want to sharply debride this at this point as I feel like that would be much too painful for her. For that reason I think were probably going to require Dakin solution to try to help clean this up which I think will be beneficial. Obviously pressure relief I think an offloading  will also be of utmost importance. Electronic Signature(s) Signed: 07/02/2020 5:50:48 PM By: Worthy Keeler PA-C Entered By: Worthy Keeler on 07/02/2020 17:50:48 [name], [name] ([id number]) -------------------------------------------------------------------------------- Physician Orders Details Patient Name: [name] Date of Service: 07/02/2020 2:45 PM Medical Record Number: [id number] Patient Account Number: [id number] Date of Birth/Sex: 12/31/1927 (87 y.o. F) Treating RN: Dolan Amen Primary Care Provider: Rory Percy Other Clinician: Jeanine Luz Referring Provider: Rory Percy Treating Provider/Extender: Skipper Cliche in Treatment: 0 Verbal / Phone Orders: No Diagnosis Coding ICD-10 Coding Code Description L89.153 Pressure ulcer of sacral region, stage 3 L89.313 Pressure ulcer of right buttock, stage 3 M62.81 Muscle weakness (generalized) F01.50 Vascular dementia without behavioral disturbance I10 Essential (primary) hypertension Z86.718 Personal history of other venous thrombosis and embolism Follow-up Appointments o Return Appointment in 2 weeks. Bathing/ Shower/ Hygiene o Clean wound with Normal Saline or wound cleanser. Additional Orders / Instructions o Follow Nutritious Diet and Increase  Protein Intake Wound Treatment Wound #4 - Sacrum Cleanser: Byram Ancillary Kit - 15 Day Supply (DME) (Generic) 1 x Per Day/15 Days Discharge Instructions: Use supplies as instructed; Kit contains: (15) Saline Bullets; (15) 3x3 Gauze; 15 pr Gloves Cleanser: Normal Saline 1 x Per Day/15 Days Discharge Instructions: Wash your hands with soap and water. Remove old dressing, discard into plastic bag and place into trash. Cleanse the wound with Normal Saline prior to applying a clean dressing using gauze sponges, not tissues or cotton balls. Do not scrub or use excessive force. Pat dry using gauze sponges, not tissue or cotton balls. Primary Dressing: Gauze (DME) (Generic) 1 x Per Day/15 Days Discharge Instructions: Moisten with Dakin's solution, pack in wound Secondary Dressing: ABD Pad 5x9 (in/in) (Generic) 1 x Per Day/15 Days Discharge Instructions: Cover with ABD pad Secured With: 65M Medipore H Soft Cloth Surgical Tape, 2x2 (in/yd) (DME) (Generic) 1 x Per Day/15 Days Discharge Instructions: Secure ABD Wound #5 - Gluteus Wound Laterality: Right Cleanser: Normal Saline 1 x Per Day/15 Days Discharge Instructions: Wash your hands with soap and water. Remove old dressing, discard into plastic bag and place into trash. Cleanse the wound with Normal Saline prior to applying a clean dressing using gauze sponges, not tissues or cotton balls. Do not scrub or use excessive force. Pat dry using gauze sponges, not tissue or cotton balls. Primary Dressing: Silvercel Small 2x2 (in/in) (DME) (Generic) 1 x Per Day/15 Days Discharge Instructions: Apply Silvercel Small 2x2 (in/in) as instructed Secondary Dressing: ABD Pad 5x9 (in/in) (DME) (Generic) 1 x Per Day/15 Days Discharge Instructions: Cover with ABD pad Secured With: 65M Medipore H Soft Cloth Surgical Tape, 2x2 (in/yd) (DME) (Generic) 1 x Per Day/15 Days Discharge Instructions: Secure ABD [name], [name] ([id number]) Patient Medications Allergies:  No Known Drug Allergies Notifications Medication Indication Start End Dakin's Solution 07/02/2020 DOSE miscellaneous 0.25 % solution - Moisten gauze with Dakin's solution then wring out leaving gauze damp not saturated before packing into the wound bed lightly as directed in Electronic Signature(s) Signed: 07/02/2020 4:38:24 PM By: Georges Mouse, Minus Breeding RN Signed: 07/03/2020 12:58:56 PM By: Worthy Keeler PA-C Previous Signature: 07/02/2020 4:18:09 PM Version By: Worthy Keeler PA-C Entered By: Georges Mouse, Minus Breeding on 07/02/2020 16:38:23 [name], [name] ([id number]) -------------------------------------------------------------------------------- Problem List Details Patient Name: [name]. Date of Service: 07/02/2020 2:45 PM Medical Record Number: [id number] Patient Account Number: [id number] Date of Birth/Sex: 27-Feb-1928 (87 y.o. F) Treating RN: Dolan Amen Primary Care Provider: Rory Percy Other Clinician: Jeanine Luz Referring  Provider: Rory Percy Treating Provider/Extender: Skipper Cliche in Treatment: 0 Active Problems ICD-10 Encounter Code Description Active Date MDM Diagnosis L89.153 Pressure ulcer of sacral region, stage 3 07/02/2020 No Yes L89.313 Pressure ulcer of right buttock, stage 3 07/02/2020 No Yes M62.81 Muscle weakness (generalized) 07/02/2020 No Yes F01.50 Vascular dementia without behavioral disturbance 07/02/2020 No Yes I10 Essential (primary) hypertension 07/02/2020 No Yes Z86.718 Personal history of other venous thrombosis and embolism 07/02/2020 No Yes Inactive Problems Resolved Problems Electronic Signature(s) Signed: 07/02/2020 3:14:00 PM By: Worthy Keeler PA-C Entered By: Worthy Keeler on 07/02/2020 15:14:00 Carawan, [name] ([id number]) -------------------------------------------------------------------------------- Progress Note Details Patient Name: [name] Date of Service: 07/02/2020 2:45  PM Medical Record Number: [id number] Patient Account Number: [id number] Date of Birth/Sex: 12/27/1927 (87 y.o. F) Treating RN: Dolan Amen Primary Care Provider: Rory Percy Other Clinician: Jeanine Luz Referring Provider: Rory Percy Treating Provider/Extender: Skipper Cliche in Treatment: 0 Subjective Chief Complaint Information obtained from Patient Gluteal and right ear ulcers History of Present Illness (HPI) 05/02/2020 upon evaluation today patient appears to be doing somewhat poorly in regard to her gluteal region. She has moisture associated skin breakdown in this area there is a lot of irritation. With that being said unfortunately pretty much anything that is put on this area causes it to become irritated and the patient will apparently get her hands back there and pull off any dressings and mess with the wound scratching a lot. With that being said I think this is the biggest issue at this point. There does not appear to be any signs of active infection which is good and again I do not see any signs of deep tissue injury. She has a small area on the posterior aspect of her auricle but again I think that is pretty much healed based on what I am seeing today. Patient does have dementia unfortunately she has congestive heart failure and all this began when she broke her hip and was in rehab in September 2021. 07/02/2020 patient appears to be doing poorly currently in regard to issues that she is having with continued moisture associated breakdown. There also appears to be some pressure related issues going on at this point as well. I previously saw her May 02, 2020. Unfortunately since that time her daughter tells me she has continued to sit quite frequently and in fact that is a big issue here as far as offloading is concerned she is not getting up and moving around as frequently as we would like to see. With that being said that has led to pressure related  breakdown of the skin unfortunately. She does have altered mental status this means that unfortunately secondary to the dementia she is really not perceiving what she needs to do for herself as far as being cautious and careful is concerned and allowing this to heal appropriately. Again this is the big struggle I think although her family seems to be doing a good job trying to change the dressings and trying to keep things under control here. Patient History Unable to Obtain Patient History due to Dementia. Information obtained from Patient. Allergies No Known Drug Allergies Social History Never smoker, Marital Status - Widowed, Alcohol Use - Never, Drug Use - No History, Caffeine Use - Never. Medical History Eyes Patient has history of Cataracts Cardiovascular Patient has history of Arrhythmia - aortic valve insufficiency, Congestive Heart Failure, Hypertension Endocrine Denies history of Type II Diabetes Neurologic Patient has history of Dementia Hospitalization/Surgery  History - Right Hip ORIF 2021. Review of Systems (ROS) Constitutional Symptoms (General Health) Denies complaints or symptoms of Fatigue, Fever, Chills, Marked Weight Change. Eyes Complains or has symptoms of Glasses / Contacts. Ear/Nose/Mouth/Throat Denies complaints or symptoms of Difficult clearing ears, Sinusitis. Hematologic/Lymphatic Denies complaints or symptoms of Bleeding / Clotting Disorders, Human Immunodeficiency Virus. Respiratory Denies complaints or symptoms of Chronic or frequent coughs, Shortness of Breath. Cardiovascular DVT hx Gastrointestinal Denies complaints or symptoms of Frequent diarrhea, Nausea, Vomiting. Endocrine Complains or has symptoms of Thyroid disease. Genitourinary Complains or has symptoms of Incontinence/dribbling. SYLVI, RYBOLT ([id number]) Immunological Denies complaints or symptoms of Hives, Itching. Integumentary (Skin) Denies complaints or symptoms of  Wounds, Bleeding or bruising tendency, Breakdown, Swelling. Musculoskeletal Denies complaints or symptoms of Muscle Pain, Muscle Weakness. Neurologic Denies complaints or symptoms of Numbness/parasthesias, Focal/Weakness. Psychiatric Complains or has symptoms of Anxiety. Objective Constitutional sitting or standing blood pressure is within target range for patient.. pulse regular and within target range for patient.Marland Kitchen respirations regular, non- labored and within target range for patient.Marland Kitchen temperature within target range for patient.. Well-nourished and well-hydrated in no acute distress. Vitals Time Taken: 2:43 PM, Weight: 134 lbs, Source: Measured, Temperature: 97.6 F, Pulse: 67 bpm, Respiratory Rate: 16 breaths/min, Blood Pressure: 135/56 mmHg. Eyes conjunctiva clear no eyelid edema noted. pupils equal round and reactive to light and accommodation. Ears, Nose, Mouth, and Throat no gross abnormality of ear auricles or external auditory canals. normal hearing noted during conversation. mucus membranes moist. Respiratory normal breathing without difficulty. Musculoskeletal Patient unable to walk without assistance. Psychiatric Patient is not able to cooperate in decision making regarding care. Patient has dementia. pleasant and cooperative. General Notes: Upon inspection patient's wound bed actually showed signs in the right gluteal region of not being too poor at this time I think that is doing okay. With that being said unfortunately in regard to her wound in the sacral region this is broken down and there is definitely an unstageable finding here which is unfortunate and I do think that the patient is going to require some debridement although she is having a lot of pain I do not really want to sharply debride this at this point as I feel like that would be much too painful for her. For that reason I think were probably going to require Dakin solution to try to help clean this up which  I think will be beneficial. Obviously pressure relief I think an offloading will also be of utmost importance. Integumentary (Hair, Skin) Wound #4 status is Open. Original cause of wound was Gradually Appeared. The date acquired was: 06/07/2020. The wound is located on the Sacrum. The wound measures 2.4cm length x 1cm width x 0.7cm depth; 1.885cm^2 area and 1.319cm^3 volume. There is Fat Layer (Subcutaneous Tissue) exposed. There is no tunneling noted, however, there is undermining starting at 1:00 and ending at 12:00 with a maximum distance of 0.7cm. There is a medium amount of serosanguineous drainage noted. There is no granulation within the wound bed. There is a large (67-100%) amount of necrotic tissue within the wound bed including Adherent Slough. Wound #5 status is Open. Original cause of wound was Gradually Appeared. The date acquired was: 06/14/2020. The wound is located on the Right Gluteus. The wound measures 0.6cm length x 1cm width x 0.2cm depth; 0.471cm^2 area and 0.094cm^3 volume. There is Fat Layer (Subcutaneous Tissue) exposed. There is no tunneling or undermining noted. There is a medium amount of sanguinous drainage noted. The wound  margin is flat and intact. There is large (67-100%) red granulation within the wound bed. There is no necrotic tissue within the wound bed. Assessment Active Problems ICD-10 Pressure ulcer of sacral region, stage 3 Pressure ulcer of right buttock, stage 3 Muscle weakness (generalized) Vascular dementia without behavioral disturbance Essential (primary) hypertension [name], Deborah N. ([id number]) Personal history of other venous thrombosis and embolism Procedures Wound #4 Pre-procedure diagnosis of Wound #4 is a Pressure Ulcer located on the Sacrum . There was a Chemical/Enzymatic/Mechanical debridement performed by Tommie Sams., PA-C. With the following instrument(s): saline and gauze. Other agent used was gauze and saline. A time out  was conducted at 16:07, prior to the start of the procedure. A Minimum amount of bleeding was controlled with Pressure. The procedure was tolerated well. Post Debridement Measurements: 2.4cm length x 1cm width x 0.7cm depth; 1.319cm^3 volume. Post debridement Stage noted as Unstageable/Unclassified. Character of Wound/Ulcer Post Debridement is stable. Post procedure Diagnosis Wound #4: Same as Pre-Procedure Wound #5 Pre-procedure diagnosis of Wound #5 is a Pressure Ulcer located on the Right Gluteus . There was a Chemical/Enzymatic/Mechanical debridement performed by Tommie Sams., PA-C. With the following instrument(s): saline and gauze. Other agent used was gauze and saline. A time out was conducted at 16:07, prior to the start of the procedure. A Minimum amount of bleeding was controlled with Pressure. The procedure was tolerated well. Post Debridement Measurements: 0.6cm length x 1cm width x 0.2cm depth; 0.094cm^3 volume. Post debridement Stage noted as Category/Stage III. Character of Wound/Ulcer Post Debridement is stable. Post procedure Diagnosis Wound #5: Same as Pre-Procedure Plan Follow-up Appointments: Return Appointment in 2 weeks. Bathing/ Shower/ Hygiene: Clean wound with Normal Saline or wound cleanser. Additional Orders / Instructions: Follow Nutritious Diet and Increase Protein Intake The following medication(s) was prescribed: Dakin's Solution miscellaneous 0.25 % solution Moisten gauze with Dakin's solution then wring out leaving gauze damp not saturated before packing into the wound bed lightly as directed in starting 07/02/2020 WOUND #4: - Sacrum Wound Laterality: Cleanser: Byram Ancillary Kit - 15 Day Supply (DME) (Generic) 1 x Per Day/15 Days Discharge Instructions: Use supplies as instructed; Kit contains: (15) Saline Bullets; (15) 3x3 Gauze; 15 pr Gloves Cleanser: Normal Saline 1 x Per Day/15 Days Discharge Instructions: Wash your hands with soap and water. Remove  old dressing, discard into plastic bag and place into trash. Cleanse the wound with Normal Saline prior to applying a clean dressing using gauze sponges, not tissues or cotton balls. Do not scrub or use excessive force. Pat dry using gauze sponges, not tissue or cotton balls. Primary Dressing: Gauze (DME) (Generic) 1 x Per Day/15 Days Discharge Instructions: Moisten with Dakin's solution, pack in wound Secondary Dressing: ABD Pad 5x9 (in/in) (Generic) 1 x Per Day/15 Days Discharge Instructions: Cover with ABD pad Secured With: 36M Medipore H Soft Cloth Surgical Tape, 2x2 (in/yd) (DME) (Generic) 1 x Per Day/15 Days Discharge Instructions: Secure ABD WOUND #5: - Gluteus Wound Laterality: Right Cleanser: Normal Saline 1 x Per Day/15 Days Discharge Instructions: Wash your hands with soap and water. Remove old dressing, discard into plastic bag and place into trash. Cleanse the wound with Normal Saline prior to applying a clean dressing using gauze sponges, not tissues or cotton balls. Do not scrub or use excessive force. Pat dry using gauze sponges, not tissue or cotton balls. Primary Dressing: Silvercel Small 2x2 (in/in) (DME) (Generic) 1 x Per Day/15 Days Discharge Instructions: Apply Silvercel Small 2x2 (in/in) as instructed Secondary Dressing:  ABD Pad 5x9 (in/in) (DME) (Generic) 1 x Per Day/15 Days Discharge Instructions: Cover with ABD pad Secured With: 7M Medipore H Soft Cloth Surgical Tape, 2x2 (in/yd) (DME) (Generic) 1 x Per Day/15 Days Discharge Instructions: Secure ABD 1. Would recommend currently that we initiate treatment Dakin's moistened gauze dressing I think this is to be appropriate for the patient and is probably the best way to try to manage this currently. This is in the sacral region. 2. I am also going to recommend silver alginate dressing for the right gluteal region. 3. I am also can recommend appropriate offloading. I explained that even if the patient were to get up every  hour and walk around for 5 to 10 minutes this would help with getting blood circulating through the gluteal and sacral region and hopefully prevent further breakdown. Obviously [name], Sharronda N. ([id number]) staying off of the sacral region is the ideal thing but she still continues to sit quite a bit too much to be honest which I think is what led to the wound. We will see patient back for reevaluation in 2 weeks here in the clinic. If anything worsens or changes patient will contact our office for additional recommendations. Electronic Signature(s) Signed: 07/02/2020 5:51:50 PM By: Worthy Keeler PA-C Entered By: Worthy Keeler on 07/02/2020 17:51:49 Howze, [name] ([id number]) -------------------------------------------------------------------------------- ROS/PFSH Details Patient Name: [name] Date of Service: 07/02/2020 2:45 PM Medical Record Number: [id number] Patient Account Number: [id number] Date of Birth/Sex: 1927-09-01 (87 y.o. F) Treating RN: Carlene Coria Primary Care Provider: Rory Percy Other Clinician: Jeanine Luz Referring Provider: Rory Percy Treating Provider/Extender: Skipper Cliche in Treatment: 0 Unable to Obtain Patient History due to oo Dementia Information Obtained From Patient Constitutional Symptoms (General Health) Complaints and Symptoms: Negative for: Fatigue; Fever; Chills; Marked Weight Change Eyes Complaints and Symptoms: Positive for: Glasses / Contacts Medical History: Positive for: Cataracts Ear/Nose/Mouth/Throat Complaints and Symptoms: Negative for: Difficult clearing ears; Sinusitis Hematologic/Lymphatic Complaints and Symptoms: Negative for: Bleeding / Clotting Disorders; Human Immunodeficiency Virus Respiratory Complaints and Symptoms: Negative for: Chronic or frequent coughs; Shortness of Breath Gastrointestinal Complaints and Symptoms: Negative for: Frequent diarrhea; Nausea;  Vomiting Endocrine Complaints and Symptoms: Positive for: Thyroid disease Medical History: Negative for: Type II Diabetes Genitourinary Complaints and Symptoms: Positive for: Incontinence/dribbling Immunological Complaints and Symptoms: Negative for: Hives; Itching Integumentary (Skin) Ellen, Tykera N. ([id number]) Complaints and Symptoms: Negative for: Wounds; Bleeding or bruising tendency; Breakdown; Swelling Musculoskeletal Complaints and Symptoms: Negative for: Muscle Pain; Muscle Weakness Neurologic Complaints and Symptoms: Negative for: Numbness/parasthesias; Focal/Weakness Medical History: Positive for: Dementia Psychiatric Complaints and Symptoms: Positive for: Anxiety Cardiovascular Complaints and Symptoms: Review of System Notes: DVT hx Medical History: Positive for: Arrhythmia - aortic valve insufficiency; Congestive Heart Failure; Hypertension HBO Extended History Items Eyes: Cataracts Immunizations Pneumococcal Vaccine: Received Pneumococcal Vaccination: Yes Implantable Devices None Hospitalization / Surgery History Type of Hospitalization/Surgery Right Hip ORIF 2021 Family and Social History Never smoker; Marital Status - Widowed; Alcohol Use: Never; Drug Use: No History; Caffeine Use: Never; Financial Concerns: No; Food, Clothing or Shelter Needs: No; Support System Lacking: No; Transportation Concerns: No Electronic Signature(s) Signed: 07/03/2020 12:58:56 PM By: Worthy Keeler PA-C Signed: 07/10/2020 3:51:10 PM By: Carlene Coria RN Entered By: Carlene Coria on 07/02/2020 14:54:28 Bearse, [name] ([id number]) -------------------------------------------------------------------------------- SuperBill Details Patient Name: [name] Date of Service: 07/02/2020 Medical Record Number: [id number] Patient Account Number: [id number] Date of Birth/Sex: 1927-04-15 (87 y.o. F) Treating RN: Dolan Amen Primary Care  Provider: Rory Percy Other Clinician: Jeanine Luz Referring Provider: Rory Percy Treating Provider/Extender: Skipper Cliche in Treatment: 0 Diagnosis Coding ICD-10 Codes Code Description [phone] Pressure ulcer of sacral region, stage 3 L89.313 Pressure ulcer of right buttock, stage 3 M62.81 Muscle weakness (generalized) F01.50 Vascular dementia without behavioral disturbance I10 Essential (primary) hypertension Z86.718 Personal history of other venous thrombosis and embolism Facility Procedures CPT4 Code: [id number] Description: 99214 - WOUND CARE VISIT-LEV 4 EST PT Modifier: Quantity: 1 Physician Procedures CPT4 Code: 1914782 Description: 95621 - WC PHYS LEVEL 4 - EST PT Modifier: Quantity: 1 CPT4 Code: Description: ICD-10 Diagnosis Description L89.153 Pressure ulcer of sacral region, stage 3 L89.313 Pressure ulcer of right buttock, stage 3 M62.81 Muscle weakness (generalized) F01.50 Vascular dementia without behavioral disturbance Modifier: Quantity: Electronic Signature(s) Signed: 07/02/2020 5:52:06 PM By: Worthy Keeler PA-C Previous Signature: 07/02/2020 4:38:41 PM Version By: Georges Mouse, Minus Breeding RN Entered By: Worthy Keeler on 07/02/2020 17:52:06

## 2020-07-10 NOTE — Progress Notes (Signed)
[name], [name] ([id number]) Visit Report for 07/02/2020 Abuse/Suicide Risk Screen Details Patient Name: [name], LAUF. Date of Service: 07/02/2020 2:45 PM Medical Record Number: [id number] Patient Account Number: [id number] Date of Birth/Sex: May 02, 1927 (87 y.o. F) Treating RN: Yevonne Pax Primary Care Roshana Shuffield: Ellwood Dense Other Clinician: Lolita Cram Referring Shondell Poulson: Ellwood Dense Treating Austine Kelsay/Extender: Rowan Blase in Treatment: 0 Abuse/Suicide Risk Screen Items Answer ABUSE RISK SCREEN: Has anyone close to you tried to hurt or harm you recentlyo No Do you feel uncomfortable with anyone in your familyo No Has anyone forced you do things that you didnot want to doo No Electronic Signature(s) Signed: 07/10/2020 3:51:10 PM By: Yevonne Pax RN Entered By: Yevonne Pax on 07/02/2020 14:51:25 Kaltenbach, [name] ([id number]) -------------------------------------------------------------------------------- Activities of Daily Living Details Patient Name: [name], MONTERROSA. Date of Service: 07/02/2020 2:45 PM Medical Record Number: [id number] Patient Account Number: [id number] Date of Birth/Sex: Mar 31, 1927 (87 y.o. F) Treating RN: Yevonne Pax Primary Care Benjamin Merrihew: Ellwood Dense Other Clinician: Lolita Cram Referring Antwone Capozzoli: Ellwood Dense Treating Melecio Cueto/Extender: Rowan Blase in Treatment: 0 Activities of Daily Living Items Answer Activities of Daily Living (Please select one for each item) Drive Automobile Not Able Take Medications Need Assistance Use Telephone Not Able Care for Appearance Need Assistance Use Toilet Need Assistance Bath / Shower Need Assistance Dress Self Need Assistance Feed Self Completely Able Walk Need Assistance Get In / Out Bed Need Assistance Housework Not Able Prepare Meals Not Able Handle Money Not Able Shop for Self Not Able Electronic Signature(s) Signed: 07/10/2020 3:51:10 PM By: Yevonne Pax RN Entered By: Yevonne Pax on 07/02/2020 14:48:15 Willcox, [name] ([id number]) -------------------------------------------------------------------------------- Education Screening Details Patient Name: [name] Date of Service: 07/02/2020 2:45 PM Medical Record Number: [id number] Patient Account Number: [id number] Date of Birth/Sex: 1928-02-02 (87 y.o. F) Treating RN: Yevonne Pax Primary Care Meela Wareing: Ellwood Dense Other Clinician: Lolita Cram Referring Valita Righter: Ellwood Dense Treating Asad Keeven/Extender: Rowan Blase in Treatment: 0 Primary Learner Assessed: Caregiver daughter Reason Patient is not Primary Learner: Alzheimer Learning Preferences/Education Level/Primary Language Learning Preference: Explanation Highest Education Level: High School Preferred Language: English Cognitive Barrier Language Barrier: No Translator Needed: No Memory Deficit: Yes Emotional Barrier: No Cultural/Religious Beliefs Affecting Medical Care: No Physical Barrier Impaired Vision: Yes Glasses Impaired Hearing: Yes Hearing Aid Decreased Hand dexterity: No Knowledge/Comprehension Knowledge Level: Low Comprehension Level: Low Ability to understand written instructions: Low Ability to understand verbal instructions: Low Motivation Anxiety Level: Anxious Cooperation: Cooperative Education Importance: Denies Need Interest in Health Problems: Uninterested Perception: Confused Willingness to Engage in Self-Management Low Activities: Readiness to Engage in Self-Management Low Activities: Electronic Signature(s) Signed: 07/10/2020 3:51:10 PM By: Yevonne Pax RN Entered By: Yevonne Pax on 07/02/2020 14:49:40 Staffa, [name] ([id number]) -------------------------------------------------------------------------------- Fall Risk Assessment Details Patient Name: [name] Date of Service: 07/02/2020 2:45 PM Medical Record Number:  [id number] Patient Account Number: [id number] Date of Birth/Sex: 08-25-1927 (87 y.o. F) Treating RN: Yevonne Pax Primary Care Chez Bulnes: Ellwood Dense Other Clinician: Lolita Cram Referring Asmara Backs: Ellwood Dense Treating Reyanna Baley/Extender: Rowan Blase in Treatment: 0 Fall Risk Assessment Items Have you had 2 or more falls in the last 12 monthso 0 No Have you had any fall that resulted in injury in the last 12 monthso 0 No FALLS RISK SCREEN History of falling - immediate or within 3 months 0 No Secondary diagnosis (Do you have 2 or more medical diagnoseso) 15 Yes Ambulatory aid None/bed rest/wheelchair/nurse 0 No Crutches/cane/walker 15 Yes Furniture 0 No  Intravenous therapy Access/Saline/Heparin Lock 0 No Gait/Transferring Normal/ bed rest/ wheelchair 0 No Weak (short steps with or without shuffle, stooped but able to lift head while walking, may 10 Yes seek support from furniture) Impaired (short steps with shuffle, may have difficulty arising from chair, head down, impaired 0 No balance) Mental Status Oriented to own ability 0 No Electronic Signature(s) Signed: 07/10/2020 3:51:10 PM By: Yevonne Pax RN Entered By: Yevonne Pax on 07/02/2020 14:50:07 Godlewski, [name] ([id number]) -------------------------------------------------------------------------------- Foot Assessment Details Patient Name: [name] Date of Service: 07/02/2020 2:45 PM Medical Record Number: [id number] Patient Account Number: [id number] Date of Birth/Sex: [birth year] (87 y.o. F) Treating RN: Yevonne Pax Primary Care Mckenzye Cutright: Ellwood Dense Other Clinician: Lolita Cram Referring Chiquitta Matty: Ellwood Dense Treating Balen Woolum/Extender: Rowan Blase in Treatment: 0 Foot Assessment Items Site Locations + = Sensation present, - = Sensation absent, C = Callus, U = Ulcer R = Redness, W = Warmth, M = Maceration, PU = Pre-ulcerative lesion F = Fissure, S = Swelling, D  = Dryness Assessment Right: Left: Other Deformity: No No Prior Foot Ulcer: No No Prior Amputation: No No Charcot Joint: No No Ambulatory Status: Ambulatory With Help Assistance Device: Walker Gait: Surveyor, mining) Signed: 07/10/2020 3:51:10 PM By: Yevonne Pax RN Entered By: Yevonne Pax on 07/02/2020 14:51:00 Fite, [name] ([id number]) -------------------------------------------------------------------------------- Nutrition Risk Screening Details Patient Name: [name], SNODGRASS. Date of Service: 07/02/2020 2:45 PM Medical Record Number: [id number] Patient Account Number: [id number] Date of Birth/Sex: 08/11/1927 (87 y.o. F) Treating RN: Yevonne Pax Primary Care Alon Mazor: Ellwood Dense Other Clinician: Lolita Cram Referring Princes Finger: Ellwood Dense Treating Sedale Jenifer/Extender: Rowan Blase in Treatment: 0 Height (in): Weight (lbs): 134 Body Mass Index (BMI): Nutrition Risk Screening Items Score Screening NUTRITION RISK SCREEN: I have an illness or condition that made me change the kind and/or amount of food I eat 0 No I eat fewer than two meals per day 0 No I eat few fruits and vegetables, or milk products 0 No I have three or more drinks of beer, liquor or wine almost every day 0 No I have tooth or mouth problems that make it hard for me to eat 0 No I don't always have enough money to buy the food I need 0 No I eat alone most of the time 0 No I take three or more different prescribed or over-the-counter drugs a day 1 Yes Without wanting to, I have lost or gained 10 pounds in the last six months 0 No I am not always physically able to shop, cook and/or feed myself 0 No Nutrition Protocols Good Risk Protocol 0 No interventions needed Moderate Risk Protocol High Risk Proctocol Risk Level: Good Risk Score: 1 Notes daughter is 24/7 caregiver and cooks for her-able to feed herself Electronic Signature(s) Signed: 07/10/2020 3:51:10 PM  By: Yevonne Pax RN Entered By: Yevonne Pax on 07/02/2020 14:50:45

## 2020-07-11 ENCOUNTER — Other Ambulatory Visit
Admission: RE | Admit: 2020-07-11 | Discharge: 2020-07-11 | Disposition: A | Discharge status: Home / Self Care | Payer: Medicare Other | Source: Ambulatory Visit | Attending: Internal Medicine | Admitting: Internal Medicine

## 2020-07-11 ENCOUNTER — Encounter: Payer: Medicare Other | Admitting: Internal Medicine

## 2020-07-11 ENCOUNTER — Other Ambulatory Visit: Payer: Self-pay

## 2020-07-11 DIAGNOSIS — B999 Unspecified infectious disease: Secondary | ICD-10-CM | POA: Diagnosis present

## 2020-07-11 DIAGNOSIS — L89313 Pressure ulcer of right buttock, stage 3: Secondary | ICD-10-CM | POA: Diagnosis not present

## 2020-07-11 NOTE — Progress Notes (Signed)
SHALICE, WOODRING ([id number]) Visit Report for 07/11/2020 Debridement Details Patient Name: [name], [name]. Date of Service: 07/11/2020 10:45 AM Medical Record Number: [id number] Patient Account Number: [id number] Date of Birth/Sex: 02/14/1928 (87 y.o. F) Treating RN: Rogers Blocker Primary Care Provider: Ellwood Dense Other Clinician: Referring Provider: Ellwood Dense Treating Provider/Extender: Altamese [place] in Treatment: 1 Debridement Performed for Wound #4 Sacrum Assessment: Performed By: Physician Maxwell Caul, MD Debridement Type: Debridement Level of Consciousness (Pre- Awake and Alert procedure): Pre-procedure Verification/Time Out Yes - 11:33 Taken: Start Time: 11:33 Total Area Debrided (L x W): 2.2 (cm) x 1 (cm) = 2.2 (cm) Tissue and other material Viable, Non-Viable, Slough, Subcutaneous, Slough debrided: Level: Skin/Subcutaneous Tissue Debridement Description: Excisional Instrument: Curette Bleeding: Minimum Hemostasis Achieved: Pressure Response to Treatment: Procedure was tolerated well Level of Consciousness (Post- Awake and Alert procedure): Post Debridement Measurements of Total Wound Length: (cm) 2.2 Stage: Unstageable/Unclassified Width: (cm) 1 Depth: (cm) 1.7 Volume: (cm) 2.937 Character of Wound/Ulcer Post Debridement: Stable Post Procedure Diagnosis Same as Pre-procedure Electronic Signature(s) Signed: 07/11/2020 5:05:31 PM By: Lajean Manes RN Signed: 07/11/2020 5:07:17 PM By: Baltazar Najjar MD Entered By: Baltazar Najjar on 07/11/2020 12:32:42 Friedel, [name] ([id number]) -------------------------------------------------------------------------------- HPI Details Patient Name: [name] Date of Service: 07/11/2020 10:45 AM Medical Record Number: [id number] Patient Account Number: [id number] Date of Birth/Sex: [birth year] (87 y.o. F) Treating RN: Rogers Blocker Primary Care Provider:  Ellwood Dense Other Clinician: Referring Provider: Ellwood Dense Treating Provider/Extender: Altamese [place] in Treatment: 1 History of Present Illness HPI Description: 05/02/2020 upon evaluation today patient appears to be doing somewhat poorly in regard to her gluteal region. She has moisture associated skin breakdown in this area there is a lot of irritation. With that being said unfortunately pretty much anything that is put on this area causes it to become irritated and the patient will apparently get her hands back there and pull off any dressings and mess with the wound scratching a lot. With that being said I think this is the biggest issue at this point. There does not appear to be any signs of active infection which is good and again I do not see any signs of deep tissue injury. She has a small area on the posterior aspect of her auricle but again I think that is pretty much healed based on what I am seeing today. Patient does have dementia unfortunately she has congestive heart failure and all this began when she broke her hip and was in rehab in September 2021. 07/02/2020 patient appears to be doing poorly currently in regard to issues that she is having with continued moisture associated breakdown. There also appears to be some pressure related issues going on at this point as well. I previously saw her May 02, 2020. Unfortunately since that time her daughter tells me she has continued to sit quite frequently and in fact that is a big issue here as far as offloading is concerned she is not getting up and moving around as frequently as we would like to see. With that being said that has led to pressure related breakdown of the skin unfortunately. She does have altered mental status this means that unfortunately secondary to the dementia she is really not perceiving what she needs to do for herself as far as being cautious and careful is concerned and allowing this to heal  appropriately. Again this is the big struggle I think although her family seems to be doing a  good job trying to change the dressings and trying to keep things under control here. 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 The latter is doing well. Unfortunately she comes in today with the wound over the sacrum having gross purulent drainage. I have obtained a culture here. She does not appear to be systemically unwell but her daughter reports she simply will not leave the dressing in place and by looks of think she is scratching at the area. She has vascular dementia. Electronic Signature(s) Signed: 07/11/2020 5:07:17 PM By: Baltazar Najjar MD Entered By: Baltazar Najjar on 07/11/2020 12:39:27 Calame, [name] ([id number]) -------------------------------------------------------------------------------- Physical Exam Details Patient Name: [name], [name]. Date of Service: 07/11/2020 10:45 AM Medical Record Number: [id number] Patient Account Number: [id number] Date of Birth/Sex: Sep 24, 1927 (87 y.o. F) Treating RN: Rogers Blocker Primary Care Provider: Ellwood Dense Other Clinician: Referring Provider: Ellwood Dense Treating Provider/Extender: Altamese [place] in Treatment: 1 Constitutional Patient is hypertensive.. Pulse regular and within target range for patient.Marland Kitchen Respirations regular, non-labored and within target range.. Temperature is normal and within the target range for the patient.Marland Kitchen appears in no distress. She does not appear systemically unwell.. Notes Wound exam; the area over the lower sacrum has deteriorated. This is precariously close to bone tunnels superiorly. Necrotic material which looks purulent. I used a #5 curette to remove some of the carotic necrotic material and obtained a specimen of purulent drainage for CandS. There is some tenderness around the wound superiorly but no gross  erythema no crepitus. Electronic Signature(s) Signed: 07/11/2020 5:07:17 PM By: Baltazar Najjar MD Entered By: Baltazar Najjar on 07/11/2020 12:40:48 Bernabei, [name] ([id number]) -------------------------------------------------------------------------------- Physician Orders Details Patient Name: [name], [name] Date of Service: 07/11/2020 10:45 AM Medical Record Number: [id number] Patient Account Number: [id number] Date of Birth/Sex: 06/29/1927 (87 y.o. F) Treating RN: Rogers Blocker Primary Care Provider: Ellwood Dense Other Clinician: Referring Provider: Ellwood Dense Treating Provider/Extender: Altamese [place] in Treatment: 1 Verbal / Phone Orders: No Diagnosis Coding Follow-up Appointments o Return Appointment in 1 week. Bathing/ Shower/ Hygiene o Clean wound with Normal Saline or wound cleanser. Additional Orders / Instructions o Follow Nutritious Diet and Increase Protein Intake Medications-Please add to medication list. o P.O. Antibiotics Wound Treatment Wound #4 - Sacrum Cleanser: Normal Saline 1 x Per Day/30 Days Discharge Instructions: Wash your hands with soap and water. Remove old dressing, discard into plastic bag and place into trash. Cleanse the wound with Normal Saline prior to applying a clean dressing using gauze sponges, not tissues or cotton balls. Do not scrub or use excessive force. Pat dry using gauze sponges, not tissue or cotton balls. Primary Dressing: Silvercel Small 2x2 (in/in) (Generic) 1 x Per Day/30 Days Discharge Instructions: Apply Silvercel Small 2x2 (in/in) as instructed-Tuck in undermining Secondary Dressing: Gauze (DME) (Generic) 1 x Per Day/30 Days Discharge Instructions: Dry gauze securing silver alginate Secondary Dressing: Mepilex Border Flex, 6x6 (in/in) 1 x Per Day/30 Days Discharge Instructions: Apply to wound as directed. Do not cut. Wound #5 - Gluteus Wound Laterality: Right Cleanser: Normal Saline 3 x  Per Week/30 Days Discharge Instructions: Wash your hands with soap and water. Remove old dressing, discard into plastic bag and place into trash. Cleanse the wound with Normal Saline prior to applying a clean dressing using gauze sponges, not tissues or cotton balls. Do not scrub or use excessive force. Pat dry using gauze sponges, not  tissue or cotton balls. Primary Dressing: Silvercel Small 2x2 (in/in) (DME) (Generic) 3 x Per Week/30 Days Discharge Instructions: Apply Silvercel Small 2x2 (in/in) as instructed Secondary Dressing: Mepilex Border Flex, 4x4 (in/in) (DME) (Generic) 3 x Per Week/30 Days Discharge Instructions: Apply to wound as directed. Do not cut. Laboratory o Bacteria identified in Wound by Culture (MICRO) - Sacrum oooo LOINC Code: [phone] oooo Convenience Name: Wound culture routine Patient Medications Allergies: No Known Drug Allergies Notifications Medication Indication Start End Augmentin 07/11/2020 Markus, Aynslee N. ([id number]) Notifications Medication Indication Start End DOSE oral 500 mg-125 mg tablet - 1 tablet oral bid for 7days Electronic Signature(s) Signed: 07/11/2020 11:50:27 AM By: Baltazar Najjar MD Entered By: Baltazar Najjar on 07/11/2020 11:50:26 Trautmann, [name] ([id number]) -------------------------------------------------------------------------------- Problem List Details Patient Name: [name] Date of Service: 07/11/2020 10:45 AM Medical Record Number: [id number] Patient Account Number: [id number] Date of Birth/Sex: Feb 22, 1928 (87 y.o. F) Treating RN: Rogers Blocker Primary Care Provider: Ellwood Dense Other Clinician: Referring Provider: Ellwood Dense Treating Provider/Extender: Altamese [place] in Treatment: 1 Active Problems ICD-10 Encounter Code Description Active Date MDM Diagnosis L89.153 Pressure ulcer of sacral region, stage 3 07/02/2020 No Yes L89.313 Pressure ulcer of right buttock, stage 3 07/02/2020  No Yes M62.81 Muscle weakness (generalized) 07/02/2020 No Yes F01.50 Vascular dementia without behavioral disturbance 07/02/2020 No Yes Z86.718 Personal history of other venous thrombosis and embolism 07/02/2020 No Yes L03.818 Cellulitis of other sites 07/11/2020 No Yes Inactive Problems ICD-10 Code Description Active Date Inactive Date I10 Essential (primary) hypertension 07/02/2020 07/02/2020 Resolved Problems Electronic Signature(s) Signed: 07/11/2020 5:07:17 PM By: Baltazar Najjar MD Entered By: Baltazar Najjar on 07/11/2020 12:32:21 Wechter, [name] ([id number]) -------------------------------------------------------------------------------- Progress Note Details Patient Name: [name] Date of Service: 07/11/2020 10:45 AM Medical Record Number: [id number] Patient Account Number: [id number] Date of Birth/Sex: May 17, 1927 (87 y.o. F) Treating RN: Rogers Blocker Primary Care Provider: Ellwood Dense Other Clinician: Referring Provider: Ellwood Dense Treating Provider/Extender: Altamese [place] in Treatment: 1 Subjective History of Present Illness (HPI) 05/02/2020 upon evaluation today patient appears to be doing somewhat poorly in regard to her gluteal region. She has moisture associated skin breakdown in this area there is a lot of irritation. With that being said unfortunately pretty much anything that is put on this area causes it to become irritated and the patient will apparently get her hands back there and pull off any dressings and mess with the wound scratching a lot. With that being said I think this is the biggest issue at this point. There does not appear to be any signs of active infection which is good and again I do not see any signs of deep tissue injury. She has a small area on the posterior aspect of her auricle but again I think that is pretty much healed based on what I am seeing today. Patient does have dementia unfortunately she has congestive  heart failure and all this began when she broke her hip and was in rehab in September 2021. 07/02/2020 patient appears to be doing poorly currently in regard to issues that she is having with continued moisture associated breakdown. There also appears to be some pressure related issues going on at this point as well. I previously saw her May 02, 2020. Unfortunately since that time her daughter tells me she has continued to sit quite frequently and in fact that is a big issue here as far as offloading is concerned she is not getting up and moving around  as frequently as we would like to see. With that being said that has led to pressure related breakdown of the skin unfortunately. She does have altered mental status this means that unfortunately secondary to the dementia she is really not perceiving what she needs to do for herself as far as being cautious and careful is concerned and allowing this to heal appropriately. Again this is the big struggle I think although her family seems to be doing a good job trying to change the dressings and trying to keep things under control here. 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 The latter is doing well. Unfortunately she comes in today with the wound over the sacrum having gross purulent drainage. I have obtained a culture here. She does not appear to be systemically unwell but her daughter reports she simply will not leave the dressing in place and by looks of think she is scratching at the area. She has vascular dementia. Objective Constitutional Patient is hypertensive.. Pulse regular and within target range for patient.Marland Kitchen. Respirations regular, non-labored and within target range.. Temperature is normal and within the target range for the patient.Marland Kitchen. appears in no distress. She does not appear systemically unwell.. Vitals Time Taken: 11:00 AM, Weight: 134 lbs, Temperature:  97.5 F, Pulse: 74 bpm, Respiratory Rate: 20 breaths/min, Blood Pressure: 167/73 mmHg. General Notes: Wound exam; the area over the lower sacrum has deteriorated. This is precariously close to bone tunnels superiorly. Necrotic material which looks purulent. I used a #5 curette to remove some of the carotic necrotic material and obtained a specimen of purulent drainage for CandS. There is some tenderness around the wound superiorly but no gross erythema no crepitus. Integumentary (Hair, Skin) Wound #4 status is Open. Original cause of wound was Gradually Appeared. The date acquired was: 06/07/2020. The wound has been in treatment 1 weeks. The wound is located on the Sacrum. The wound measures 2.2cm length x 1cm width x 1.6cm depth; 1.728cm^2 area and 2.765cm^3 volume. There is Fat Layer (Subcutaneous Tissue) exposed. There is no tunneling noted, however, there is undermining starting at 11:00 and ending at 6:00 with a maximum distance of 2.5cm. There is a medium amount of serosanguineous drainage noted. There is no granulation within the wound bed. There is a large (67-100%) amount of necrotic tissue within the wound bed including Adherent Slough. Wound #5 status is Open. Original cause of wound was Gradually Appeared. The date acquired was: 06/14/2020. The wound has been in treatment 1 weeks. The wound is located on the Right Gluteus. The wound measures 0.6cm length x 0.6cm width x 0.1cm depth; 0.283cm^2 area and 0.028cm^3 volume. There is Fat Layer (Subcutaneous Tissue) exposed. There is no tunneling or undermining noted. There is a medium amount of sanguinous drainage noted. The wound margin is flat and intact. There is large (67-100%) red granulation within the wound bed. There is no necrotic tissue within the wound bed. Assessment Brockmann, Danyella N. ([id number]) Active Problems ICD-10 Pressure ulcer of sacral region, stage 3 Pressure ulcer of right buttock, stage 3 Muscle weakness  (generalized) Vascular dementia without behavioral disturbance Personal history of other venous thrombosis and embolism Cellulitis of other sites Procedures Wound #4 Pre-procedure diagnosis of Wound #4 is a Pressure Ulcer located on the Sacrum . There was a Excisional Skin/Subcutaneous Tissue Debridement with a total area of 2.2 sq cm performed by Maxwell CaulOBSON, Merdis Snodgrass G, MD. With the following  instrument(s): Curette to remove Viable and Non-Viable tissue/material. Material removed includes Subcutaneous Tissue and Slough and. A time out was conducted at 11:33, prior to the start of the procedure. A Minimum amount of bleeding was controlled with Pressure. The procedure was tolerated well. Post Debridement Measurements: 2.2cm length x 1cm width x 1.7cm depth; 2.937cm^3 volume. Post debridement Stage noted as Unstageable/Unclassified. Character of Wound/Ulcer Post Debridement is stable. Post procedure Diagnosis Wound #4: Same as Pre-Procedure Plan Follow-up Appointments: Return Appointment in 1 week. Bathing/ Shower/ Hygiene: Clean wound with Normal Saline or wound cleanser. Additional Orders / Instructions: Follow Nutritious Diet and Increase Protein Intake Medications-Please add to medication list.: P.O. Antibiotics Laboratory ordered were: Wound culture routine - Sacrum The following medication(s) was prescribed: Augmentin oral 500 mg-125 mg tablet 1 tablet oral bid for 7days starting 07/11/2020 WOUND #4: - Sacrum Wound Laterality: Cleanser: Normal Saline 1 x Per Day/30 Days Discharge Instructions: Wash your hands with soap and water. Remove old dressing, discard into plastic bag and place into trash. Cleanse the wound with Normal Saline prior to applying a clean dressing using gauze sponges, not tissues or cotton balls. Do not scrub or use excessive force. Pat dry using gauze sponges, not tissue or cotton balls. Primary Dressing: Silvercel Small 2x2 (in/in) (Generic) 1 x Per Day/30  Days Discharge Instructions: Apply Silvercel Small 2x2 (in/in) as instructed-Tuck in undermining Secondary Dressing: Gauze (DME) (Generic) 1 x Per Day/30 Days Discharge Instructions: Dry gauze securing silver alginate Secondary Dressing: Mepilex Border Flex, 6x6 (in/in) 1 x Per Day/30 Days Discharge Instructions: Apply to wound as directed. Do not cut. WOUND #5: - Gluteus Wound Laterality: Right Cleanser: Normal Saline 3 x Per Week/30 Days Discharge Instructions: Wash your hands with soap and water. Remove old dressing, discard into plastic bag and place into trash. Cleanse the wound with Normal Saline prior to applying a clean dressing using gauze sponges, not tissues or cotton balls. Do not scrub or use excessive force. Pat dry using gauze sponges, not tissue or cotton balls. Primary Dressing: Silvercel Small 2x2 (in/in) (DME) (Generic) 3 x Per Week/30 Days Discharge Instructions: Apply Silvercel Small 2x2 (in/in) as instructed Secondary Dressing: Mepilex Border Flex, 4x4 (in/in) (DME) (Generic) 3 x Per Week/30 Days Discharge Instructions: Apply to wound as directed. Do not cut. 1. Wound infection; I started her empirically on Augmentin. Change the primary dressing to silver alginate change daily. Cover with bordered foam hopefully she will find the latter less bulky and may be leave it in place a little longer 2. We talked about adaptive clothing for patients with dementia that also might help although they have also already tried things like mittens etc. 3. I am going to wait for a culture to come back and then try to address this more specifically. I have warned them if erythema expands around the area, or she becomes systemically unwell i.e. delirious, febrile etc. she may need hospitalization for this 4. Cannot rule out a deeper tissue infection osteomyelitis etc. Hoffmeier, Esther N. ([id number]) She is coming back next Tuesday for [place] to follow-up on this. At that point the culture may  be back. I put her on Augmentin 501 p.o. twice daily for 7 days Electronic Signature(s) Signed: 07/11/2020 5:07:17 PM By: Baltazar Najjar MD Entered By: Baltazar Najjar on 07/11/2020 12:42:25 Oley, [name] ([id number]) -------------------------------------------------------------------------------- SuperBill Details Patient Name: [name]. Date of Service: 07/11/2020 Medical Record Number: [id number] Patient Account Number: [id number] Date of Birth/Sex: May 10, 1927 (87 y.o. F) Treating RN:  Rogers Blocker Primary Care Provider: Ellwood Dense Other Clinician: Referring Provider: Ellwood Dense Treating Provider/Extender: Altamese  in Treatment: 1 Diagnosis Coding ICD-10 Codes Code Description [phone] Pressure ulcer of sacral region, stage 3 L89.313 Pressure ulcer of right buttock, stage 3 M62.81 Muscle weakness (generalized) F01.50 Vascular dementia without behavioral disturbance I10 Essential (primary) hypertension Z86.718 Personal history of other venous thrombosis and embolism Facility Procedures CPT4 Code: [id number] Description: 11042 - DEB SUBQ TISSUE 20 SQ CM/< Modifier: Quantity: 1 CPT4 Code: Description: ICD-10 Diagnosis Description L89.153 Pressure ulcer of sacral region, stage 3 Modifier: Quantity: Physician Procedures CPT4 Code: [id number] Description: 11042 - WC PHYS SUBQ TISS 20 SQ CM Modifier: Quantity: 1 CPT4 Code: Description: ICD-10 Diagnosis Description L89.153 Pressure ulcer of sacral region, stage 3 Modifier: Quantity: Electronic Signature(s) Signed: 07/11/2020 5:07:17 PM By: Baltazar Najjar MD Entered By: Baltazar Najjar on 07/11/2020 12:42:38

## 2020-07-12 NOTE — Progress Notes (Signed)
EMAGENE, MERFELD ([id number]) Visit Report for 07/11/2020 Arrival Information Details Patient Name: [name], [name]. Date of Service: 07/11/2020 10:45 AM Medical Record Number: [id number] Patient Account Number: [id number] Date of Birth/Sex: [birth year] (87 y.o. F) Treating RN: Yevonne Pax Primary Care Orma Cheetham: Ellwood Dense Other Clinician: Referring Chane Magner: Ellwood Dense Treating Audria Takeshita/Extender: Altamese [place] in Treatment: 1 Visit Information History Since Last Visit All ordered tests and consults were completed: No Patient Arrived: Wheel Chair Added or deleted any medications: No Arrival Time: 10:47 Any new allergies or adverse reactions: No Accompanied By: daughter Had a fall or experienced change in No Transfer Assistance: Manual activities of daily living that may affect Patient Identification Verified: Yes risk of falls: Secondary Verification Process Completed: Yes Signs or symptoms of abuse/neglect since last visito No Patient Has Alerts: Yes Hospitalized since last visit: No Patient Alerts: Eliquis STOPPED 04/16/20 Implantable device outside of the clinic excluding No cellular tissue based products placed in the center since last visit: Has Dressing in Place as Prescribed: Yes Pain Present Now: No Electronic Signature(s) Signed: 07/12/2020 8:24:32 AM By: Yevonne Pax RN Entered By: Yevonne Pax on 07/11/2020 10:59:54 Spreen, [name] ([id number]) -------------------------------------------------------------------------------- Clinic Level of Care Assessment Details Patient Name: NYLIA, GAVINA. Date of Service: 07/11/2020 10:45 AM Medical Record Number: [id number] Patient Account Number: [id number] Date of Birth/Sex: 01/03/1928 (87 y.o. F) Treating RN: Rogers Blocker Primary Care Hartley Urton: Ellwood Dense Other Clinician: Referring Brantlee Penn: Ellwood Dense Treating Jesper Stirewalt/Extender: Altamese [place] in Treatment:  1 Clinic Level of Care Assessment Items TOOL 1 Quantity Score []  - Use when EandM and Procedure is performed on INITIAL visit 0 ASSESSMENTS - Nursing Assessment / Reassessment []  - General Physical Exam (combine w/ comprehensive assessment (listed just below) when performed on new 0 pt. evals) []  - 0 Comprehensive Assessment (HX, ROS, Risk Assessments, Wounds Hx, etc.) ASSESSMENTS - Wound and Skin Assessment / Reassessment []  - Dermatologic / Skin Assessment (not related to wound area) 0 ASSESSMENTS - Ostomy and/or Continence Assessment and Care []  - Incontinence Assessment and Management 0 []  - 0 Ostomy Care Assessment and Management (repouching, etc.) PROCESS - Coordination of Care []  - Simple Patient / Family Education for ongoing care 0 []  - 0 Complex (extensive) Patient / Family Education for ongoing care []  - 0 Staff obtains , Records, Test Results / Process Orders []  - 0 Staff telephones HHA, Nursing Homes / Clarify orders / etc []  - 0 Routine Transfer to another Facility (non-emergent condition) []  - 0 Routine Hospital Admission (non-emergent condition) []  - 0 New Admissions / / Ordering NPWT, Apligraf, etc. []  - 0 Emergency Hospital Admission (emergent condition) PROCESS - Special Needs []  - Pediatric / Minor Patient Management 0 []  - 0 Isolation Patient Management []  - 0 Hearing / Language / Visual special needs []  - 0 Assessment of Community assistance (transportation, D/C planning, etc.) []  - 0 Additional assistance / Altered mentation []  - 0 Support Surface(s) Assessment (bed, cushion, seat, etc.) INTERVENTIONS - Miscellaneous []  - External ear exam 0 []  - 0 Patient Transfer (multiple staff / / Similar devices) []  - 0 Simple Staple / Suture removal (25 or less) []  - 0 Complex Staple / Suture removal (26 or more) []  - 0 Hypo/Hyperglycemic Management (do not check if billed separately) []  - 0 Ankle /  Brachial Index (ABI) - do not check if billed separately Has the patient been seen at the hospital within the last three years: Yes Total  Score: 0 Level Of Care: ____ Ewing SchleinGRIFFIN, Ziya N. ([id number]) Electronic Signature(s) Signed: 07/11/2020 5:05:31 PM By: Phillis HaggisSanchez Pereyda, Dondra PraderKenia RN Entered By: Phillis HaggisSanchez Pereyda, Dondra PraderKenia on 07/11/2020 11:43:42 Rehberg, Brooke BonitoJERALDINE N. ([id number]) -------------------------------------------------------------------------------- Encounter Discharge Information Details Patient Name: Ewing SchleinGRIFFIN, Baileigh N. Date of Service: 07/11/2020 10:45 AM Medical Record Number: [id number] Patient Account Number: [id number] Date of Birth/Sex: 22-Oct-1927 (87 y.o. F) Treating RN: Hansel FeinsteinBishop, Joy Primary Care Cerrone Debold: Ellwood Denseumball, Alison Other Clinician: Referring Artice Bergerson: Ellwood Denseumball, Alison Treating Laporscha Linehan/Extender: Altamese CarolinaOBSON, MICHAEL G Weeks in Treatment: 1 Encounter Discharge Information Items Post Procedure Vitals Discharge Condition: Stable Temperature (F): 97.8 Ambulatory Status: Wheelchair Pulse (bpm): 73 Discharge Destination: Home Respiratory Rate (breaths/min): 16 Transportation: Private Auto Blood Pressure (mmHg): 167/73 Accompanied By: daughter Schedule Follow-up Appointment: Yes Clinical Summary of Care: Electronic Signature(s) Signed: 07/11/2020 2:09:49 PM By: Hansel FeinsteinBishop, Joy Entered By: Hansel FeinsteinBishop, Joy on 07/11/2020 11:57:14 Patella, Brooke BonitoJERALDINE N. ([id number]) -------------------------------------------------------------------------------- Lower Extremity Assessment Details Patient Name: Ewing SchleinGRIFFIN, Pranavi N. Date of Service: 07/11/2020 10:45 AM Medical Record Number: [id number] Patient Account Number: [id number] Date of Birth/Sex: 22-Oct-1927 (87 y.o. F) Treating RN: Yevonne PaxEpps, Carrie Primary Care Mavrik Bynum: Ellwood Denseumball, Alison Other Clinician: Referring Suzie Vandam: Ellwood Denseumball, Alison Treating Syrianna Schillaci/Extender: Altamese CarolinaOBSON, MICHAEL G Weeks in Treatment: 1 Electronic Signature(s) Signed:  07/12/2020 8:24:32 AM By: Yevonne PaxEpps, Carrie RN Entered By: Yevonne PaxEpps, Carrie on 07/11/2020 11:03:47 Underhill, Brooke BonitoJERALDINE N. ([id number]) -------------------------------------------------------------------------------- Multi Wound Chart Details Patient Name: Ewing SchleinGRIFFIN, Josselyne N. Date of Service: 07/11/2020 10:45 AM Medical Record Number: [id number] Patient Account Number: [id number] Date of Birth/Sex: 22-Oct-1927 (87 y.o. F) Treating RN: Rogers BlockerSanchez, Kenia Primary Care Rhian Funari: Ellwood Denseumball, Alison Other Clinician: Referring Thom Ollinger: Ellwood Denseumball, Alison Treating Sharlee Rufino/Extender: Altamese CarolinaOBSON, MICHAEL G Weeks in Treatment: 1 Vital Signs Height(in): Pulse(bpm): 74 Weight(lbs): 134 Blood Pressure(mmHg): 167/73 Body Mass Index(BMI): Temperature(F): 97.5 Respiratory Rate(breaths/min): 20 Photos: [N/A:N/A] Wound Location: Sacrum Right Gluteus N/A Wounding Event: Gradually Appeared Gradually Appeared N/A Primary Etiology: Pressure Ulcer Pressure Ulcer N/A Comorbid History: Cataracts, Arrhythmia, Congestive Cataracts, Arrhythmia, Congestive N/A Heart Failure, Hypertension, Heart Failure, Hypertension, Dementia Dementia Date Acquired: 06/07/2020 06/14/2020 N/A Weeks of Treatment: 1 1 N/A Wound Status: Open Open N/A Measurements L x W x D (cm) 2.2x1x1.6 0.6x0.6x0.1 N/A Area (cm) : 1.728 0.283 N/A Volume (cm) : 2.765 0.028 N/A % Reduction in Area: 8.30% 39.90% N/A % Reduction in Volume: -109.60% 70.20% N/A Starting Position 1 (o'clock): 11 Ending Position 1 (o'clock): 6 Maximum Distance 1 (cm): 2.5 Undermining: Yes No N/A Classification: Unstageable/Unclassified Category/Stage III N/A Exudate Amount: Medium Medium N/A Exudate Type: Serosanguineous Sanguinous N/A Exudate Color: red, brown red N/A Wound Margin: N/A Flat and Intact N/A Granulation Amount: None Present (0%) Large (67-100%) N/A Granulation Quality: N/A Red N/A Necrotic Amount: Large (67-100%) None Present (0%) N/A Exposed Structures: Fat Layer  (Subcutaneous Tissue): Fat Layer (Subcutaneous Tissue): N/A Yes Yes Fascia: No Fascia: No Tendon: No Tendon: No Muscle: No Muscle: No Joint: No Joint: No Bone: No Bone: No Epithelialization: None Medium (34-66%) N/A Debridement: Debridement - Excisional N/A N/A Pre-procedure Verification/Time 11:33 N/A N/A Out Taken: Tissue Debrided: Subcutaneous, Slough N/A N/A Level: Skin/Subcutaneous Tissue N/A N/A Debridement Area (sq cm): 2.2 N/A N/A Instrument: Curette N/A N/A Bleeding: Minimum N/A N/A Ewing SchleinGRIFFIN, Suad N. ([id number]) Hemostasis Achieved: Pressure N/A N/A Debridement Treatment Procedure was tolerated well N/A N/A Response: Post Debridement 2.2x1x1.7 N/A N/A Measurements L x W x D (cm) Post Debridement Volume: 2.937 N/A N/A (cm) Post Debridement Stage: Unstageable/Unclassified N/A N/A Procedures Performed: Debridement N/A N/A Treatment Notes Wound #4 (Sacrum) Cleanser Normal Saline Discharge Instruction: Wash your hands  with soap and water. Remove old dressing, discard into plastic bag and place into trash. Cleanse the wound with Normal Saline prior to applying a clean dressing using gauze sponges, not tissues or cotton balls. Do not scrub or use excessive force. Pat dry using gauze sponges, not tissue or cotton balls. Peri-Wound Care Topical Primary Dressing Silvercel Small 2x2 (in/in) Discharge Instruction: Apply Silvercel Small 2x2 (in/in) as instructed-Tuck in undermining Secondary Dressing Gauze Discharge Instruction: Dry gauze securing silver alginate Mepilex Border Flex, 6x6 (in/in) Discharge Instruction: Apply to wound as directed. Do not cut. Secured With Compression Wrap Compression Stockings Add-Ons Wound #5 (Gluteus) Wound Laterality: Right Cleanser Normal Saline Discharge Instruction: Wash your hands with soap and water. Remove old dressing, discard into plastic bag and place into trash. Cleanse the wound with Normal Saline prior to  applying a clean dressing using gauze sponges, not tissues or cotton balls. Do not scrub or use excessive force. Pat dry using gauze sponges, not tissue or cotton balls. Peri-Wound Care Topical Primary Dressing Silvercel Small 2x2 (in/in) Discharge Instruction: Apply Silvercel Small 2x2 (in/in) as instructed Secondary Dressing Mepilex Border Flex, 4x4 (in/in) Discharge Instruction: Apply to wound as directed. Do not cut. Secured With Compression Wrap Compression Stockings Add-Ons LINDAMARIE, MACLACHLAN ([id number]) Electronic Signature(s) Signed: 07/11/2020 5:07:17 PM By: Baltazar Najjar MD Entered By: Baltazar Najjar on 07/11/2020 12:32:28 Busker, [name] ([id number]) -------------------------------------------------------------------------------- Multi-Disciplinary Care Plan Details Patient Name: JESSCIA, IMM Date of Service: 07/11/2020 10:45 AM Medical Record Number: [id number] Patient Account Number: [id number] Date of Birth/Sex: May 20, 1927 (87 y.o. F) Treating RN: Rogers Blocker Primary Care Aanyah Loa: Ellwood Dense Other Clinician: Referring Abas Leicht: Ellwood Dense Treating Chanah Tidmore/Extender: Altamese [place] in Treatment: 1 Active Inactive Pressure Nursing Diagnoses: Knowledge deficit related to causes and risk factors for pressure ulcer development Knowledge deficit related to management of pressures ulcers Goals: Patient will remain free from development of additional pressure ulcers Date Initiated: 07/02/2020 Target Resolution Date: 08/01/2020 Goal Status: Active Patient will remain free of pressure ulcers Date Initiated: 07/02/2020 Target Resolution Date: 08/01/2020 Goal Status: Active Patient/caregiver will verbalize risk factors for pressure ulcer development Date Initiated: 07/02/2020 Target Resolution Date: 07/02/2020 Goal Status: Active Patient/caregiver will verbalize understanding of pressure ulcer management Date Initiated:  07/02/2020 Target Resolution Date: 07/02/2020 Goal Status: Active Interventions: Assess: immobility, friction, shearing, incontinence upon admission and as needed Assess offloading mechanisms upon admission and as needed Assess potential for pressure ulcer upon admission and as needed Provide education on pressure ulcers Notes: Wound/Skin Impairment Nursing Diagnoses: Impaired tissue integrity Goals: Patient/caregiver will verbalize understanding of skin care regimen Date Initiated: 07/02/2020 Target Resolution Date: 07/02/2020 Goal Status: Active Ulcer/skin breakdown will have a volume reduction of 30% by week 4 Date Initiated: 07/02/2020 Target Resolution Date: 08/01/2020 Goal Status: Active Ulcer/skin breakdown will have a volume reduction of 50% by week 8 Date Initiated: 07/02/2020 Target Resolution Date: 09/01/2020 Goal Status: Active Ulcer/skin breakdown will have a volume reduction of 80% by week 12 Date Initiated: 07/02/2020 Target Resolution Date: 10/01/2020 Goal Status: Active Ulcer/skin breakdown will heal within 14 weeks Date Initiated: 07/02/2020 Target Resolution Date: 11/01/2020 Goal Status: Active Interventions: Assess patient/caregiver ability to obtain necessary supplies Assess patient/caregiver ability to perform ulcer/skin care regimen upon admission and as needed KOREE, STAHELI ([id number]) Assess ulceration(s) every visit Provide education on ulcer and skin care Treatment Activities: Referred to DME Rigdon Macomber for dressing supplies : 07/02/2020 Skin care regimen initiated : 07/02/2020 Notes: Electronic Signature(s) Signed: 07/11/2020 5:05:31 PM By: Mordecai Maes  Atilano Ina RN Entered By: Phillis Haggis, Kenia on 07/11/2020 11:32:57 Zelada, [name] ([id number]) -------------------------------------------------------------------------------- Pain Assessment Details Patient Name: TASHAWNA, THOM. Date of Service: 07/11/2020 10:45 AM Medical Record  Number: [id number] Patient Account Number: [id number] Date of Birth/Sex: [birth year] (87 y.o. F) Treating RN: Yevonne Pax Primary Care Maricel Swartzendruber: Ellwood Dense Other Clinician: Referring Ondra Deboard: Ellwood Dense Treating Cereniti Curb/Extender: Altamese [place] in Treatment: 1 Active Problems Location of Pain Severity and Description of Pain Patient Has Paino No Site Locations Pain Management and Medication Current Pain Management: Electronic Signature(s) Signed: 07/12/2020 8:24:32 AM By: Yevonne Pax RN Entered By: Yevonne Pax on 07/11/2020 11:00:20 Whitham, [name] ([id number]) -------------------------------------------------------------------------------- Patient/Caregiver Education Details Patient Name: Ewing Schlein Date of Service: 07/11/2020 10:45 AM Medical Record Number: [id number] Patient Account Number: [id number] Date of Birth/Gender: 1927/10/06 (87 y.o. F) Treating RN: Rogers Blocker Primary Care Physician: Ellwood Dense Other Clinician: Referring Physician: Ellwood Dense Treating Physician/Extender: Altamese [place] in Treatment: 1 Education Assessment Education Provided To: Caregiver daughter Education Topics Provided Wound/Skin Impairment: Methods: Explain/Verbal Responses: State content correctly Electronic Signature(s) Signed: 07/11/2020 5:05:31 PM By: Phillis Haggis, Dondra Prader RN Entered By: Phillis Haggis, Kenia on 07/11/2020 11:43:59 Sadowsky, [name] ([id number]) -------------------------------------------------------------------------------- Wound Assessment Details Patient Name: Ewing Schlein. Date of Service: 07/11/2020 10:45 AM Medical Record Number: [id number] Patient Account Number: [id number] Date of Birth/Sex: 07/11/1927 (87 y.o. F) Treating RN: Yevonne Pax Primary Care Terrell Shimko: Ellwood Dense Other Clinician: Referring Hallelujah Wysong: Ellwood Dense Treating Rossie Bretado/Extender: Altamese [place] in  Treatment: 1 Wound Status Wound Number: 4 Primary Pressure Ulcer Etiology: Wound Location: Sacrum Wound Status: Open Wounding Event: Gradually Appeared Comorbid Cataracts, Arrhythmia, Congestive Heart Failure, Date Acquired: 06/07/2020 History: Hypertension, Dementia Weeks Of Treatment: 1 Clustered Wound: No Photos Wound Measurements Length: (cm) 2.2 % Reduc Width: (cm) 1 % Reduc Depth: (cm) 1.6 Epithel Area: (cm) 1.728 Tunnel Volume: (cm) 2.765 Underm Star Endi Maxi tion in Area: 8.3% tion in Volume: -109.6% ialization: None ing: No ining: Yes ting Position (o'clock): 11 ng Position (o'clock): 6 mum Distance: (cm) 2.5 Wound Description Classification: Unstageable/Unclassified Foul O Exudate Amount: Medium Slough Exudate Type: Serosanguineous Exudate Color: red, brown dor After Cleansing: No /Fibrino Yes Wound Bed Granulation Amount: None Present (0%) Exposed Structure Necrotic Amount: Large (67-100%) Fascia Exposed: No Necrotic Quality: Adherent Slough Fat Layer (Subcutaneous Tissue) Exposed: Yes Tendon Exposed: No Muscle Exposed: No Joint Exposed: No Bone Exposed: No Treatment Notes Wound #4 (Sacrum) Cleanser Normal Saline Tetrault, Katrese N. ([id number]) Discharge Instruction: Wash your hands with soap and water. Remove old dressing, discard into plastic bag and place into trash. Cleanse the wound with Normal Saline prior to applying a clean dressing using gauze sponges, not tissues or cotton balls. Do not scrub or use excessive force. Pat dry using gauze sponges, not tissue or cotton balls. Peri-Wound Care Topical Primary Dressing Silvercel Small 2x2 (in/in) Discharge Instruction: Apply Silvercel Small 2x2 (in/in) as instructed-Tuck in undermining Secondary Dressing Gauze Discharge Instruction: Dry gauze securing silver alginate Mepilex Border Flex, 6x6 (in/in) Discharge Instruction: Apply to wound as directed. Do not cut. Secured  With Compression Wrap Compression Stockings Facilities manager) Signed: 07/12/2020 8:24:32 AM By: Yevonne Pax RN Entered By: Yevonne Pax on 07/11/2020 11:02:09 Petzold, [name] ([id number]) -------------------------------------------------------------------------------- Wound Assessment Details Patient Name: Ewing Schlein. Date of Service: 07/11/2020 10:45 AM Medical Record Number: [id number] Patient Account Number: [id number] Date of Birth/Sex: 1927/04/12 (87 y.o. F) Treating RN: Yevonne Pax Primary Care Janani Chamber: Ellwood Dense Other  Clinician: Referring Dakota Stangl: Ellwood Dense Treating Porshia Blizzard/Extender: Altamese [place] in Treatment: 1 Wound Status Wound Number: 5 Primary Pressure Ulcer Etiology: Wound Location: Right Gluteus Wound Status: Open Wounding Event: Gradually Appeared Comorbid Cataracts, Arrhythmia, Congestive Heart Failure, Date Acquired: 06/14/2020 History: Hypertension, Dementia Weeks Of Treatment: 1 Clustered Wound: No Photos Wound Measurements Length: (cm) 0.6 Width: (cm) 0.6 Depth: (cm) 0.1 Area: (cm) 0.283 Volume: (cm) 0.028 % Reduction in Area: 39.9% % Reduction in Volume: 70.2% Epithelialization: Medium (34-66%) Tunneling: No Undermining: No Wound Description Classification: Category/Stage III Wound Margin: Flat and Intact Exudate Amount: Medium Exudate Type: Sanguinous Exudate Color: red Foul Odor After Cleansing: No Slough/Fibrino No Wound Bed Granulation Amount: Large (67-100%) Exposed Structure Granulation Quality: Red Fascia Exposed: No Necrotic Amount: None Present (0%) Fat Layer (Subcutaneous Tissue) Exposed: Yes Tendon Exposed: No Muscle Exposed: No Joint Exposed: No Bone Exposed: No Treatment Notes Wound #5 (Gluteus) Wound Laterality: Right Cleanser Normal Saline Discharge Instruction: Wash your hands with soap and water. Remove old dressing, discard into plastic bag and place into  trash. Cleanse the wound with Normal Saline prior to applying a clean dressing using gauze sponges, not tissues or cotton balls. Do not scrub or use excessive force. Pat dry using gauze sponges, not tissue or cotton balls. JADIRA, NIERMAN ([id number]) Peri-Wound Care Topical Primary Dressing Silvercel Small 2x2 (in/in) Discharge Instruction: Apply Silvercel Small 2x2 (in/in) as instructed Secondary Dressing Mepilex Border Flex, 4x4 (in/in) Discharge Instruction: Apply to wound as directed. Do not cut. Secured With Compression Wrap Compression Stockings Facilities manager) Signed: 07/12/2020 8:24:32 AM By: Yevonne Pax RN Entered By: Yevonne Pax on 07/11/2020 11:02:48 Gillum, [name] ([id number]) -------------------------------------------------------------------------------- Vitals Details Patient Name: Ewing Schlein Date of Service: 07/11/2020 10:45 AM Medical Record Number: [id number] Patient Account Number: [id number] Date of Birth/Sex: 06/09/1927 (87 y.o. F) Treating RN: Yevonne Pax Primary Care Marnita Poirier: Ellwood Dense Other Clinician: Referring Hosea Hanawalt: Ellwood Dense Treating Mayrin Schmuck/Extender: Altamese [place] in Treatment: 1 Vital Signs Time Taken: 11:00 Temperature (F): 97.5 Weight (lbs): 134 Pulse (bpm): 74 Respiratory Rate (breaths/min): 20 Blood Pressure (mmHg): 167/73 Reference Range: 80 - 120 mg / dl Electronic Signature(s) Signed: 07/12/2020 8:24:32 AM By: Yevonne Pax RN Entered By: Yevonne Pax on 07/11/2020 11:00:14

## 2020-07-14 LAB — AEROBIC CULTURE W GRAM STAIN (SUPERFICIAL SPECIMEN): Culture: NORMAL

## 2020-07-15 ENCOUNTER — Telehealth: Payer: Self-pay

## 2020-07-15 ENCOUNTER — Other Ambulatory Visit: Payer: Self-pay

## 2020-07-15 DIAGNOSIS — E079 Disorder of thyroid, unspecified: Secondary | ICD-10-CM

## 2020-07-15 MED ORDER — LEVOTHYROXINE SODIUM 75 MCG PO TABS: 75.0000 ug | ORAL_TABLET | Freq: Every day | ORAL | 0 refills | Status: DC

## 2020-07-15 NOTE — Telephone Encounter (Signed)
Pt needs appt for refill and labs for thyroid

## 2020-07-16 ENCOUNTER — Encounter: Payer: Medicare Other | Attending: Physician Assistant | Admitting: Physician Assistant

## 2020-07-16 ENCOUNTER — Other Ambulatory Visit: Payer: Self-pay

## 2020-07-16 DIAGNOSIS — Z86718 Personal history of other venous thrombosis and embolism: Secondary | ICD-10-CM | POA: Insufficient documentation

## 2020-07-16 DIAGNOSIS — L89313 Pressure ulcer of right buttock, stage 3: Secondary | ICD-10-CM | POA: Diagnosis present

## 2020-07-16 DIAGNOSIS — I509 Heart failure, unspecified: Secondary | ICD-10-CM | POA: Diagnosis not present

## 2020-07-16 DIAGNOSIS — L89153 Pressure ulcer of sacral region, stage 3: Secondary | ICD-10-CM | POA: Diagnosis not present

## 2020-07-16 DIAGNOSIS — I110 Hypertensive heart disease with heart failure: Secondary | ICD-10-CM | POA: Insufficient documentation

## 2020-07-18 NOTE — Progress Notes (Signed)
[name] ([id number]) Visit Report for 07/16/2020 Arrival Information Details Patient Name: [name], [name]. Date of Service: 07/16/2020 2:15 PM Medical Record Number: [id number] Patient Account Number: [name] Date of Birth/Sex: 30-Jul-1927 (87 y.o. F) Treating RN: [name] Primary Care [name]: [name] Other Clinician: Lolita Mack Referring [name]: [name] Treating [name]/Extender: [name] in Treatment: 2 Visit Information History Since Last Visit Added or deleted any medications: No Patient Arrived: Wheel Chair Had a fall or experienced change in No Arrival Time: 14:12 activities of daily living that may affect Accompanied By: daughter risk of falls: Transfer Assistance: EasyPivot Patient Lift Hospitalized since last visit: No Patient Identification Verified: Yes Has Dressing in Place as Prescribed: Yes Secondary Verification Process Completed: Yes Pain Present Now: Yes Patient Has Alerts: Yes Patient Alerts: Eliquis STOPPED 04/16/20 Electronic Signature(s) Signed: 07/16/2020 4:14:10 PM By: [name] Entered By: [name] on 07/16/2020 14:12:37 [name], [name] ([id number]) -------------------------------------------------------------------------------- Clinic Level of Care Assessment Details Patient Name: [name], [name] Date of Service: 07/16/2020 2:15 PM Medical Record Number: [id number] Patient Account Number: [name] Date of Birth/Sex: 08-05-1927 (87 y.o. F) Treating RN: [name] Primary Care [name]: [name] Other Clinician: Lolita Mack Referring Derryl Uher: [name] Treating [name]/Extender: [name] in Treatment: 2 Clinic Level of Care Assessment Items TOOL 4 Quantity Score []  - Use when only an EandM is performed on FOLLOW-UP visit 0 ASSESSMENTS - Nursing Assessment / Reassessment []  - Reassessment of Co-morbidities (includes updates in patient status) 0 []  -  0 Reassessment of Adherence to Treatment Plan ASSESSMENTS - Wound and Skin Assessment / Reassessment []  - Simple Wound Assessment / Reassessment - one wound 0 X- 2 5 Complex Wound Assessment / Reassessment - multiple wounds []  - 0 Dermatologic / Skin Assessment (not related to wound area) ASSESSMENTS - Focused Assessment []  - Circumferential Edema Measurements - multi extremities 0 []  - 0 Nutritional Assessment / Counseling / Intervention []  - 0 Lower Extremity Assessment (monofilament, tuning fork, pulses) []  - 0 Peripheral Arterial Disease Assessment (using hand held doppler) ASSESSMENTS - Ostomy and/or Continence Assessment and Care []  - Incontinence Assessment and Management 0 []  - 0 Ostomy Care Assessment and Management (repouching, etc.) PROCESS - Coordination of Care X - Simple Patient / Family Education for ongoing care 1 15 []  - 0 Complex (extensive) Patient / Family Education for ongoing care X- 1 10 Staff obtains , Records, Test Results / Process Orders []  - 0 Staff telephones HHA, Nursing Homes / Clarify orders / etc []  - 0 Routine Transfer to another Facility (non-emergent condition) []  - 0 Routine Hospital Admission (non-emergent condition) []  - 0 New Admissions / / Ordering NPWT, Apligraf, etc. []  - 0 Emergency Hospital Admission (emergent condition) X- 1 10 Simple Discharge Coordination []  - 0 Complex (extensive) Discharge Coordination PROCESS - Special Needs []  - Pediatric / Minor Patient Management 0 []  - 0 Isolation Patient Management []  - 0 Hearing / Language / Visual special needs []  - 0 Assessment of Community assistance (transportation, D/C planning, etc.) []  - 0 Additional assistance / Altered mentation []  - 0 Support Surface(s) Assessment (bed, cushion, seat, etc.) INTERVENTIONS - Wound Cleansing / Measurement Mack, Deborah N. ( ) []  - 0 Simple Wound Cleansing - one wound X- 2 5 Complex  Wound Cleansing - multiple wounds X- 1 5 Wound Imaging (photographs - any number of wounds) []  - 0 Wound Tracing (instead of photographs) []  - 0 Simple Wound Measurement - one wound X-  2 5 Complex Wound Measurement - multiple wounds INTERVENTIONS - Wound Dressings []  - Small Wound Dressing one or multiple wounds 0 X- 2 15 Medium Wound Dressing one or multiple wounds []  - 0 Large Wound Dressing one or multiple wounds []  - 0 Application of Medications - topical []  - 0 Application of Medications - injection INTERVENTIONS - Miscellaneous []  - External ear exam 0 []  - 0 Specimen Collection (cultures, biopsies, blood, body fluids, etc.) []  - 0 Specimen(s) / Culture(s) sent or taken to Lab for analysis []  - 0 Patient Transfer (multiple staff / / Similar devices) []  - 0 Simple Staple / Suture removal (25 or less) []  - 0 Complex Staple / Suture removal (26 or more) []  - 0 Hypo / Hyperglycemic Management (close monitor of Blood Glucose) []  - 0 Ankle / Brachial Index (ABI) - do not check if billed separately X- 1 5 Vital Signs Has the patient been seen at the hospital within the last three years: Yes Total Score: 105 Level Of Care: New/Established - Level 3 Electronic Signature(s) Signed: 07/17/2020 6:13:14 PM By: , BSN, RN, CWS, Kim RN, BSN Entered By: , BSN, RN, CWS, Kim on 07/16/2020 15:02:22 [name], ( ) -------------------------------------------------------------------------------- Encounter Discharge Information Details Patient Name: . Date of Service: 07/16/2020 2:15 PM Medical Record Number: Patient Account Number: Date of Birth/Sex: Apr 12, 1927 (87 y.o. F) Treating RN: 09/16/2020 Primary Care Clariece Roesler: Elliot Gurney Other Clinician: Elliot Gurney Referring Madyx Delfin: 09/15/2020 Treating Nechuma Boven/Extender: [name] in Treatment: 2 Encounter Discharge Information  Items Discharge Condition: Stable Ambulatory Status: Wheelchair Discharge Destination: Home Transportation: Private Auto Accompanied By: daughter Schedule Follow-up Appointment: Yes Clinical Summary of Care: Electronic Signature(s) Signed: 07/16/2020 5:01:09 PM By: [name] Entered By: 09/15/2020 on 07/16/2020 15:14:32 [name], [name] (14/02/1928) -------------------------------------------------------------------------------- Lower Extremity Assessment Details Patient Name: [name]. Date of Service: 07/16/2020 2:15 PM Medical Record Number: [name] Patient Account Number: [name] Date of Birth/Sex: [birth year] (87 y.o. F) Treating RN: 09/15/2020 Primary Care Alonna Bartling: [name] Other Clinician: Lolita Mack Referring Yarel Rushlow: 09/15/2020 Treating Safari Cinque/Extender: [name] in Treatment: 2 Electronic Signature(s) Signed: 07/16/2020 4:14:10 PM By: [name] Entered By: 09/15/2020 on 07/16/2020 14:22:36 Robling, [name] (14/02/1928) -------------------------------------------------------------------------------- Multi Wound Chart Details Patient Name: [name]. Date of Service: 07/16/2020 2:15 PM Medical Record Number: [name] Patient Account Number: [name] Date of Birth/Sex: 25-Oct-1927 (87 y.o. F) Treating RN: 09/15/2020 Primary Care Layla Gramm: [name] Other Clinician: Hansel Mack Referring Elio Haden: 09/15/2020 Treating Detroit Frieden/Extender: [name] in Treatment: 2 Vital Signs Height(in): Pulse(bpm): 78 Weight(lbs): 134 Blood Pressure(mmHg): 135/58 Body Mass Index(BMI): Temperature(F): 98.3 Respiratory Rate(breaths/min): 18 Photos: [N/A:N/A] Wound Location: Sacrum Right Gluteus N/A Wounding Event: Gradually Appeared Gradually Appeared N/A Primary Etiology: Pressure Ulcer Pressure Ulcer N/A Comorbid History: Cataracts, Arrhythmia, Congestive Cataracts, Arrhythmia,  Congestive N/A Heart Failure, Hypertension, Heart Failure, Hypertension, Dementia Dementia Date Acquired: 06/07/2020 06/14/2020 N/A Weeks of Treatment: 2 2 N/A Wound Status: Open Open N/A Measurements L x W x D (cm) 2.2x1.3x1.6 0.4x0.4x0.1 N/A Area (cm) : 2.246 0.126 N/A Volume (cm) : 3.594 0.013 N/A % Reduction in Area: -19.20% 73.20% N/A % Reduction in Volume: -172.50% 86.20% N/A Starting Position 1 (o'clock): 12 Ending Position 1 (o'clock): 11 Maximum Distance 1 (cm): 2.5 Undermining: Yes No N/A Classification: Unstageable/Unclassified Category/Stage III N/A Exudate Amount: Medium Medium N/A Exudate Type: Serosanguineous Serosanguineous N/A Exudate Color: red, brown red, brown N/A Wound Margin: N/A  Flat and Intact N/A Granulation Amount: None Present (0%) Large (67-100%) N/A Granulation Quality: N/A Red N/A Necrotic Amount: Large (67-100%) None Present (0%) N/A Exposed Structures: Fat Layer (Subcutaneous Tissue): Fat Layer (Subcutaneous Tissue): N/A Yes Yes Fascia: No Fascia: No Tendon: No Tendon: No Muscle: No Muscle: No Joint: No Joint: No Bone: No Bone: No Epithelialization: None Medium (34-66%) N/A Treatment Notes Electronic Signature(s) Signed: 07/17/2020 6:13:14 PM By: Elliot Gurney, BSN, RN, CWS, Kim RN, BSN [place]ies[place], [place] ([id number]) Entered By: Elliot Gurney, BSN, RN, CWS, Kim on 07/16/2020 15:00:41 Lawry, [name] ([id number]) -------------------------------------------------------------------------------- Multi-Disciplinary Care Plan Details Patient Name: [name], [name]. Date of Service: 07/16/2020 2:15 PM Medical Record Number: [id number] Patient Account Number: [name] Date of Birth/Sex: 02-May-1927 (87 y.o. F) Treating RN: [name] Primary Care Jaxsen Bernhart: [name] Other Clinician: Lolita Mack Referring Jeneen Doutt: [name] Treating Margert Edsall/Extender: [name] in Treatment: 2 Active Inactive Pressure Nursing  Diagnoses: Knowledge deficit related to causes and risk factors for pressure ulcer development Knowledge deficit related to management of pressures ulcers Goals: Patient will remain free from development of additional pressure ulcers Date Initiated: 07/02/2020 Target Resolution Date: 08/01/2020 Goal Status: Active Patient will remain free of pressure ulcers Date Initiated: 07/02/2020 Target Resolution Date: 08/01/2020 Goal Status: Active Patient/caregiver will verbalize risk factors for pressure ulcer development Date Initiated: 07/02/2020 Target Resolution Date: 07/02/2020 Goal Status: Active Patient/caregiver will verbalize understanding of pressure ulcer management Date Initiated: 07/02/2020 Target Resolution Date: 07/02/2020 Goal Status: Active Interventions: Assess: immobility, friction, shearing, incontinence upon admission and as needed Assess offloading mechanisms upon admission and as needed Assess potential for pressure ulcer upon admission and as needed Provide education on pressure ulcers Notes: Wound/Skin Impairment Nursing Diagnoses: Impaired tissue integrity Goals: Patient/caregiver will verbalize understanding of skin care regimen Date Initiated: 07/02/2020 Target Resolution Date: 07/02/2020 Goal Status: Active Ulcer/skin breakdown will have a volume reduction of 30% by week 4 Date Initiated: 07/02/2020 Target Resolution Date: 08/01/2020 Goal Status: Active Ulcer/skin breakdown will have a volume reduction of 50% by week 8 Date Initiated: 07/02/2020 Target Resolution Date: 09/01/2020 Goal Status: Active Ulcer/skin breakdown will have a volume reduction of 80% by week 12 Date Initiated: 07/02/2020 Target Resolution Date: 10/01/2020 Goal Status: Active Ulcer/skin breakdown will heal within 14 weeks Date Initiated: 07/02/2020 Target Resolution Date: 11/01/2020 Goal Status: Active Interventions: Assess patient/caregiver ability to obtain necessary supplies Assess  patient/caregiver ability to perform ulcer/skin care regimen upon admission and as needed [name], [name] ([id number]) Assess ulceration(s) every visit Provide education on ulcer and skin care Treatment Activities: Referred to DME Anastasio Wogan for dressing supplies : 07/02/2020 Skin care regimen initiated : 07/02/2020 Notes: Electronic Signature(s) Signed: 07/17/2020 6:13:14 PM By: Elliot Gurney, BSN, RN, CWS, Kim RN, BSN Entered By: Elliot Gurney, BSN, RN, CWS, Kim on 07/16/2020 15:00:27 [name], [name] ([id number]) -------------------------------------------------------------------------------- Pain Assessment Details Patient Name: [name] Date of Service: 07/16/2020 2:15 PM Medical Record Number: [id number] Patient Account Number: [name] Date of Birth/Sex: [birth year] (87 y.o. F) Treating RN: [name] Primary Care Karthik Whittinghill: [name] Other Clinician: Lolita Mack Referring Anira Senegal: [name] Treating Marcelene Weidemann/Extender: [name] in Treatment: 2 Active Problems Location of Pain Severity and Description of Pain Patient Has Paino Yes Site Locations Pain Location: Pain in Ulcers Rate the pain. Current Pain Level: 2 Pain Management and Medication Current Pain Management: Electronic Signature(s) Signed: 07/16/2020 4:14:10 PM By: [name] Entered By: [name] on 07/16/2020 14:13:19 [name], [name] ([id number]) -------------------------------------------------------------------------------- Patient/Caregiver Education Details Patient Name: [name]  Date of Service: 07/16/2020 2:15 PM Medical Record Number: [id number] Patient Account Number: [id number] Date of Birth/Gender: 28-Feb-1928 (87 y.o. F) Treating RN: Deborah CoventryWoody, Kim Primary Care Physician: Deborah Denseumball, Alison Other Clinician: Lolita CramBurnette, Kyara Referring Physician: Ellwood Denseumball, Alison Treating Physician/Extender: Deborah BlaseStone, Hoyt Weeks in Treatment: 2 Education Assessment Education  Provided To: Patient Education Topics Provided Wound/Skin Impairment: Handouts: Caring for Your Ulcer Methods: Demonstration, Explain/Verbal Responses: State content correctly Electronic Signature(s) Signed: 07/17/2020 6:13:14 PM By: Elliot GurneyWoody, BSN, RN, CWS, Kim RN, BSN Entered By: Elliot GurneyWoody, BSN, RN, CWS, Kim on 07/16/2020 15:02:42 Nathanson, Deborah BonitoJERALDINE N. ([id number]) -------------------------------------------------------------------------------- Wound Assessment Details Patient Name: [name], Deborah N. Date of Service: 07/16/2020 2:15 PM Medical Record Number: [id number] Patient Account Number: [id number] Date of Birth/Sex: 28-Feb-1928 (87 y.o. F) Treating RN: Deborah FeinsteinBishop, Joy Primary Care Hosey Burmester: Deborah Denseumball, Alison Other Clinician: Lolita CramBurnette, Kyara Referring Alamin Mccuiston: Deborah Denseumball, Alison Treating Reba Hulett/Extender: Deborah BlaseStone, Hoyt Weeks in Treatment: 2 Wound Status Wound Number: 4 Primary Pressure Ulcer Etiology: Wound Location: Sacrum Wound Status: Open Wounding Event: Gradually Appeared Comorbid Cataracts, Arrhythmia, Congestive Heart Failure, Date Acquired: 06/07/2020 History: Hypertension, Dementia Weeks Of Treatment: 2 Clustered Wound: No Photos Wound Measurements Length: (cm) 2.2 % Reduct Width: (cm) 1.3 % Reduct Depth: (cm) 1.6 Epitheli Area: (cm) 2.246 Tunneli Volume: (cm) 3.594 Undermi Start Endin Maxim ion in Area: -19.2% ion in Volume: -172.5% alization: None ng: No ning: Yes ing Position (o'clock): 12 g Position (o'clock): 11 um Distance: (cm) 2.5 Wound Description Classification: Unstageable/Unclassified Foul Od Exudate Amount: Medium Slough/ Exudate Type: Serosanguineous Exudate Color: red, brown or After Cleansing: No Fibrino Yes Wound Bed Granulation Amount: None Present (0%) Exposed Structure Necrotic Amount: Large (67-100%) Fascia Exposed: No Necrotic Quality: Adherent Slough Fat Layer (Subcutaneous Tissue) Exposed: Yes Tendon Exposed: No Muscle Exposed:  No Joint Exposed: No Bone Exposed: No Treatment Notes Wound #4 (Sacrum) Cleanser Normal Saline Brisendine, Cayli N. ([id number]) Discharge Instruction: Wash your hands with soap and water. Remove old dressing, discard into plastic bag and place into trash. Cleanse the wound with Normal Saline prior to applying a clean dressing using gauze sponges, not tissues or cotton balls. Do not scrub or use excessive force. Pat dry using gauze sponges, not tissue or cotton balls. Peri-Wound Care Topical Primary Dressing Silvercel Small 2x2 (in/in) Discharge Instruction: Apply Silvercel Small 2x2 (in/in) as instructed-Tuck in undermining Secondary Dressing Mepilex Border Flex, 6x6 (in/in) Discharge Instruction: Apply to wound as directed. Do not cut. Secured With Compression Wrap Compression Stockings Add-Ons Electronic Signature(s) Signed: 07/16/2020 4:14:10 PM By: Deborah FeinsteinBishop, Joy Entered By: Deborah FeinsteinBishop, Joy on 07/16/2020 14:21:24 Eldridge, Deborah BonitoJERALDINE N. ([id number]) -------------------------------------------------------------------------------- Wound Assessment Details Patient Name: [name], Deborah N. Date of Service: 07/16/2020 2:15 PM Medical Record Number: [id number] Patient Account Number: [id number] Date of Birth/Sex: 28-Feb-1928 (87 y.o. F) Treating RN: Deborah FeinsteinBishop, Joy Primary Care Monzerrath Mcburney: Deborah Denseumball, Alison Other Clinician: Lolita CramBurnette, Kyara Referring Irma Roulhac: Deborah Denseumball, Alison Treating Rana Adorno/Extender: Deborah BlaseStone, Hoyt Weeks in Treatment: 2 Wound Status Wound Number: 5 Primary Pressure Ulcer Etiology: Wound Location: Right Gluteus Wound Status: Open Wounding Event: Gradually Appeared Comorbid Cataracts, Arrhythmia, Congestive Heart Failure, Date Acquired: 06/14/2020 History: Hypertension, Dementia Weeks Of Treatment: 2 Clustered Wound: No Photos Wound Measurements Length: (cm) 0.4 Width: (cm) 0.4 Depth: (cm) 0.1 Area: (cm) 0.126 Volume: (cm) 0.013 % Reduction in Area: 73.2% % Reduction  in Volume: 86.2% Epithelialization: Medium (34-66%) Tunneling: No Undermining: No Wound Description Classification: Category/Stage III Wound Margin: Flat and Intact Exudate Amount: Medium Exudate Type: Serosanguineous Exudate Color: red, brown Foul Odor After Cleansing: No Slough/Fibrino No Wound Bed Granulation  Amount: Large (67-100%) Exposed Structure Granulation Quality: Red Fascia Exposed: No Necrotic Amount: None Present (0%) Fat Layer (Subcutaneous Tissue) Exposed: Yes Tendon Exposed: No Muscle Exposed: No Joint Exposed: No Bone Exposed: No Treatment Notes Wound #5 (Gluteus) Wound Laterality: Right Cleanser Normal Saline Discharge Instruction: Wash your hands with soap and water. Remove old dressing, discard into plastic bag and place into trash. Cleanse the wound with Normal Saline prior to applying a clean dressing using gauze sponges, not tissues or cotton balls. Do not scrub or use excessive force. Pat dry using gauze sponges, not tissue or cotton balls. LAEL, [place] ([id number]) Peri-Wound Care Topical Primary Dressing Silvercel Small 2x2 (in/in) Discharge Instruction: Apply Silvercel Small 2x2 (in/in) as instructed Secondary Dressing Mepilex Border Flex, 4x4 (in/in) Discharge Instruction: Apply to wound as directed. Do not cut. Secured With Compression Wrap Compression Stockings Add-Ons Electronic Signature(s) Signed: 07/16/2020 4:14:10 PM By: [name] Entered By: [name] on 07/16/2020 14:22:23 Dipierro, [name] ([id number]) -------------------------------------------------------------------------------- Vitals Details Patient Name: [name] Date of Service: 07/16/2020 2:15 PM Medical Record Number: [id number] Patient Account Number: [name] Date of Birth/Sex: 01-25-1928 (87 y.o. F) Treating RN: [name] Primary Care Christyl Osentoski: [name] Other Clinician: Lolita Mack Referring Madalen Gavin: [name] Treating Azarria Balint/Extender: [name] in Treatment: 2 Vital Signs Time Taken: 14:10 Temperature (F): 98.3 Weight (lbs): 134 Pulse (bpm): 78 Respiratory Rate (breaths/min): 18 Blood Pressure (mmHg): 135/58 Reference Range: 80 - 120 mg / dl Electronic Signature(s) Signed: 07/16/2020 4:14:10 PM By: [name] Entered ByHansel Mack on 07/16/2020 14:13:00

## 2020-07-18 NOTE — Progress Notes (Signed)
KHAILEE, MICK ([id number]) Visit Report for 07/16/2020 Chief Complaint Document Details Patient Name: [name], [name]. Date of Service: 07/16/2020 2:15 PM Medical Record Number: [id number] Patient Account Number: [id number] Date of Birth/Sex: 1927-06-17 (87 y.o. F) Treating RN: Rogers Blocker Primary Care Provider: Ellwood Dense Other Clinician: Lolita Cram Referring Provider: Ellwood Dense Treating Provider/Extender: Rowan Blase in Treatment: 2 Information Obtained from: Patient Chief Complaint Gluteal and right ear ulcers Electronic Signature(s) Signed: 07/16/2020 2:27:08 PM By: Lenda Kelp PA-C Entered By: Lenda Kelp on 07/16/2020 14:27:08 [name] ([id number]) -------------------------------------------------------------------------------- HPI Details Patient Name: [name] Date of Service: 07/16/2020 2:15 PM Medical Record Number: [id number] Patient Account Number: [id number] Date of Birth/Sex: 1927/05/07 (87 y.o. F) Treating RN: Rogers Blocker Primary Care Provider: Ellwood Dense Other Clinician: Lolita Cram Referring Provider: Ellwood Dense Treating Provider/Extender: Rowan Blase in Treatment: 2 History of Present Illness HPI Description: 05/02/2020 upon evaluation today patient appears to be doing somewhat poorly in regard to her gluteal region. She has moisture associated skin breakdown in this area there is a lot of irritation. With that being said unfortunately pretty much anything that is put on this area causes it to become irritated and the patient will apparently get her hands back there and pull off any dressings and mess with the wound scratching a lot. With that being said I think this is the biggest issue at this point. There does not appear to be any signs of active infection which is good and again I do not see any signs of deep tissue injury. She has a small area on the posterior aspect of her  auricle but again I think that is pretty much healed based on what I am seeing today. Patient does have dementia unfortunately she has congestive heart failure and all this began when she broke her hip and was in rehab in September 2021. 07/02/2020 patient appears to be doing poorly currently in regard to issues that she is having with continued moisture associated breakdown. There also appears to be some pressure related issues going on at this point as well. I previously saw her May 02, 2020. Unfortunately since that time her daughter tells me she has continued to sit quite frequently and in fact that is a big issue here as far as offloading is concerned she is not getting up and moving around as frequently as we would like to see. With that being said that has led to pressure related breakdown of the skin unfortunately. She does have altered mental status this means that unfortunately secondary to the dementia she is really not perceiving what she needs to do for herself as far as being cautious and careful is concerned and allowing this to heal appropriately. Again this is the big struggle I think although her family seems to be doing a good job trying to change the dressings and trying to keep things under control here. 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 The latter is doing well. Unfortunately she comes in today with the wound over the sacrum having gross purulent drainage. I have obtained a culture here. She does not appear to be systemically unwell but her daughter reports she simply will not leave the dressing in place and by looks of think she is scratching at the area. She has vascular dementia. 07/16/2020 upon evaluation today patient appears to be doing well with regard  to her wounds. In fact I feel like she is doing much better than she has last week. Fortunately there is no signs of active infection at  this time. Her culture was negative and showed only normal skin flora but the Augmentin does seem to have helped. Electronic Signature(s) Signed: 07/16/2020 3:03:47 PM By: Lenda KelpStone III, Callyn Severtson PA-C Entered By: Lenda KelpStone III, Mayla Biddy on 07/16/2020 15:03:47 [name], Deborah BonitoJERALDINE N. ([id number]) -------------------------------------------------------------------------------- Physical Exam Details Patient Name: Deborah SchleinGRIFFIN, Deborah N. Date of Service: 07/16/2020 2:15 PM Medical Record Number: [id number] Patient Account Number: [id number] Date of Birth/Sex: [birth year] (87 y.o. F) Treating RN: Rogers BlockerSanchez, Kenia Primary Care Provider: Ellwood Denseumball, Alison Other Clinician: Lolita CramBurnette, Kyara Referring Provider: Ellwood Denseumball, Alison Treating Provider/Extender: Rowan BlaseStone, Ples Trudel Weeks in Treatment: 2 Constitutional Well-nourished and well-hydrated in no acute distress. Respiratory normal breathing without difficulty. Psychiatric Patient is not able to cooperate in decision making regarding care. Patient has dementia. pleasant and cooperative. Notes Patient's wound bed showed signs of good granulation epithelization at this point. There does not appear to be any signs of infection right now which is great news and the wounds both appear to be doing great so I am happy in that regard. Overall I think the patient is headed in the correct direction. Electronic Signature(s) Signed: 07/16/2020 3:04:06 PM By: Lenda KelpStone III, Savannaha Stonerock PA-C Entered By: Lenda KelpStone III, Son Barkan on 07/16/2020 15:04:06 [name], Deborah BonitoJERALDINE N. ([id number]) -------------------------------------------------------------------------------- Physician Orders Details Patient Name: Deborah SchleinGRIFFIN, Deborah N. Date of Service: 07/16/2020 2:15 PM Medical Record Number: [id number] Patient Account Number: [id number] Date of Birth/Sex: [birth year] (87 y.o. F) Treating RN: Huel CoventryWoody, Kim Primary Care Provider: Ellwood Denseumball, Alison Other Clinician: Lolita CramBurnette, Kyara Referring Provider: Ellwood Denseumball, Alison Treating  Provider/Extender: Rowan BlaseStone, Geeta Dworkin Weeks in Treatment: 2 Verbal / Phone Orders: No Diagnosis Coding ICD-10 Coding Code Description L89.153 Pressure ulcer of sacral region, stage 3 L89.313 Pressure ulcer of right buttock, stage 3 M62.81 Muscle weakness (generalized) F01.50 Vascular dementia without behavioral disturbance Z86.718 Personal history of other venous thrombosis and embolism L03.818 Cellulitis of other sites Follow-up Appointments o Return Appointment in 1 week. Bathing/ Shower/ Hygiene o Clean wound with Normal Saline or wound cleanser. Additional Orders / Instructions o Follow Nutritious Diet and Increase Protein Intake Medications-Please add to medication list. o P.O. Antibiotics - complete antibiotics Wound Treatment Wound #4 - Sacrum Cleanser: Normal Saline 1 x Per Day/30 Days Discharge Instructions: Wash your hands with soap and water. Remove old dressing, discard into plastic bag and place into trash. Cleanse the wound with Normal Saline prior to applying a clean dressing using gauze sponges, not tissues or cotton balls. Do not scrub or use excessive force. Pat dry using gauze sponges, not tissue or cotton balls. Primary Dressing: Silvercel Small 2x2 (in/in) (Generic) 1 x Per Day/30 Days Discharge Instructions: Apply Silvercel Small 2x2 (in/in) as instructed-Tuck in undermining Secondary Dressing: Mepilex Border Flex, 6x6 (in/in) 1 x Per Day/30 Days Discharge Instructions: Apply to wound as directed. Do not cut. Wound #5 - Gluteus Wound Laterality: Right Cleanser: Normal Saline 3 x Per Week/30 Days Discharge Instructions: Wash your hands with soap and water. Remove old dressing, discard into plastic bag and place into trash. Cleanse the wound with Normal Saline prior to applying a clean dressing using gauze sponges, not tissues or cotton balls. Do not scrub or use excessive force. Pat dry using gauze sponges, not tissue or cotton balls. Primary Dressing:  Silvercel Small 2x2 (in/in) (Generic) 3 x Per Week/30 Days Discharge Instructions: Apply Silvercel Small 2x2 (in/in) as instructed Secondary Dressing: Mepilex Border  Flex, 4x4 (in/in) (Generic) 3 x Per Week/30 Days Discharge Instructions: Apply to wound as directed. Do not cut. Electronic Signature(s) Signed: 07/16/2020 4:59:24 PM By: Lenda Kelp PA-C Signed: 07/17/2020 6:13:14 PM By: Elliot Gurney, BSN, RN, CWS, Kim RN, BSN Entered By: Elliot Gurney, BSN, RN, CWS, Kim on 07/16/2020 15:01:37 [name], [name] ([id number]) [name], [name] ([id number]) -------------------------------------------------------------------------------- Problem List Details Patient Name: [name], CORDLE. Date of Service: 07/16/2020 2:15 PM Medical Record Number: [id number] Patient Account Number: [id number] Date of Birth/Sex: 01-20-1928 (87 y.o. F) Treating RN: Rogers Blocker Primary Care Provider: Ellwood Dense Other Clinician: Lolita Cram Referring Provider: Ellwood Dense Treating Provider/Extender: Rowan Blase in Treatment: 2 Active Problems ICD-10 Encounter Code Description Active Date MDM Diagnosis L89.153 Pressure ulcer of sacral region, stage 3 07/02/2020 No Yes L89.313 Pressure ulcer of right buttock, stage 3 07/02/2020 No Yes M62.81 Muscle weakness (generalized) 07/02/2020 No Yes F01.50 Vascular dementia without behavioral disturbance 07/02/2020 No Yes Z86.718 Personal history of other venous thrombosis and embolism 07/02/2020 No Yes L03.818 Cellulitis of other sites 07/11/2020 No Yes Inactive Problems ICD-10 Code Description Active Date Inactive Date I10 Essential (primary) hypertension 07/02/2020 07/02/2020 Resolved Problems Electronic Signature(s) Signed: 07/16/2020 2:27:02 PM By: Lenda Kelp PA-C Entered By: Lenda Kelp on 07/16/2020 14:27:02 [name], [name] ([id number]) -------------------------------------------------------------------------------- Progress Note  Details Patient Name: [name] Date of Service: 07/16/2020 2:15 PM Medical Record Number: [id number] Patient Account Number: [id number] Date of Birth/Sex: 06/09/1927 (87 y.o. F) Treating RN: Rogers Blocker Primary Care Provider: Ellwood Dense Other Clinician: Lolita Cram Referring Provider: Ellwood Dense Treating Provider/Extender: Rowan Blase in Treatment: 2 Subjective Chief Complaint Information obtained from Patient Gluteal and right ear ulcers History of Present Illness (HPI) 05/02/2020 upon evaluation today patient appears to be doing somewhat poorly in regard to her gluteal region. She has moisture associated skin breakdown in this area there is a lot of irritation. With that being said unfortunately pretty much anything that is put on this area causes it to become irritated and the patient will apparently get her hands back there and pull off any dressings and mess with the wound scratching a lot. With that being said I think this is the biggest issue at this point. There does not appear to be any signs of active infection which is good and again I do not see any signs of deep tissue injury. She has a small area on the posterior aspect of her auricle but again I think that is pretty much healed based on what I am seeing today. Patient does have dementia unfortunately she has congestive heart failure and all this began when she broke her hip and was in rehab in September 2021. 07/02/2020 patient appears to be doing poorly currently in regard to issues that she is having with continued moisture associated breakdown. There also appears to be some pressure related issues going on at this point as well. I previously saw her May 02, 2020. Unfortunately since that time her daughter tells me she has continued to sit quite frequently and in fact that is a big issue here as far as offloading is concerned she is not getting up and moving around as frequently as we  would like to see. With that being said that has led to pressure related breakdown of the skin unfortunately. She does have altered mental status this means that unfortunately secondary to the dementia she is really not perceiving what she needs to do for herself as far as being  cautious and careful is concerned and allowing this to heal appropriately. Again this is the big struggle I think although her family seems to be doing a good job trying to change the dressings and trying to keep things under control here. 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 The latter is doing well. Unfortunately she comes in today with the wound over the sacrum having gross purulent drainage. I have obtained a culture here. She does not appear to be systemically unwell but her daughter reports she simply will not leave the dressing in place and by looks of think she is scratching at the area. She has vascular dementia. 07/16/2020 upon evaluation today patient appears to be doing well with regard to her wounds. In fact I feel like she is doing much better than she has last week. Fortunately there is no signs of active infection at this time. Her culture was negative and showed only normal skin flora but the Augmentin does seem to have helped. Objective Constitutional Well-nourished and well-hydrated in no acute distress. Vitals Time Taken: 2:10 PM, Weight: 134 lbs, Temperature: 98.3 F, Pulse: 78 bpm, Respiratory Rate: 18 breaths/min, Blood Pressure: 135/58 mmHg. Respiratory normal breathing without difficulty. Psychiatric Patient is not able to cooperate in decision making regarding care. Patient has dementia. pleasant and cooperative. General Notes: Patient's wound bed showed signs of good granulation epithelization at this point. There does not appear to be any signs of infection right now which is great news and the wounds both appear to  be doing great so I am happy in that regard. Overall I think the patient is headed in the correct direction. Integumentary (Hair, Skin) Wound #4 status is Open. Original cause of wound was Gradually Appeared. The date acquired was: 06/07/2020. The wound has been in treatment 2 weeks. The wound is located on the Sacrum. The wound measures 2.2cm length x 1.3cm width x 1.6cm depth; 2.246cm^2 area and 3.594cm^3 volume. There is Fat Layer (Subcutaneous Tissue) exposed. There is no tunneling noted, however, there is undermining starting at 12:00 and ending at 11:00 with a maximum distance of 2.5cm. There is a medium amount of serosanguineous drainage noted. There is no granulation within the wound bed. There is a large (67-100%) amount of necrotic tissue within the wound bed including Adherent Slough. [name], [name] ([id number]) Wound #5 status is Open. Original cause of wound was Gradually Appeared. The date acquired was: 06/14/2020. The wound has been in treatment 2 weeks. The wound is located on the Right Gluteus. The wound measures 0.4cm length x 0.4cm width x 0.1cm depth; 0.126cm^2 area and 0.013cm^3 volume. There is Fat Layer (Subcutaneous Tissue) exposed. There is no tunneling or undermining noted. There is a medium amount of serosanguineous drainage noted. The wound margin is flat and intact. There is large (67-100%) red granulation within the wound bed. There is no necrotic tissue within the wound bed. Assessment Active Problems ICD-10 Pressure ulcer of sacral region, stage 3 Pressure ulcer of right buttock, stage 3 Muscle weakness (generalized) Vascular dementia without behavioral disturbance Personal history of other venous thrombosis and embolism Cellulitis of other sites Plan Follow-up Appointments: Return Appointment in 1 week. Bathing/ Shower/ Hygiene: Clean wound with Normal Saline or wound cleanser. Additional Orders / Instructions: Follow Nutritious Diet and Increase  Protein Intake Medications-Please add to medication list.: P.O. Antibiotics - complete antibiotics WOUND #4: - Sacrum Wound Laterality: Cleanser: Normal  Saline 1 x Per Day/30 Days Discharge Instructions: Wash your hands with soap and water. Remove old dressing, discard into plastic bag and place into trash. Cleanse the wound with Normal Saline prior to applying a clean dressing using gauze sponges, not tissues or cotton balls. Do not scrub or use excessive force. Pat dry using gauze sponges, not tissue or cotton balls. Primary Dressing: Silvercel Small 2x2 (in/in) (Generic) 1 x Per Day/30 Days Discharge Instructions: Apply Silvercel Small 2x2 (in/in) as instructed-Tuck in undermining Secondary Dressing: Mepilex Border Flex, 6x6 (in/in) 1 x Per Day/30 Days Discharge Instructions: Apply to wound as directed. Do not cut. WOUND #5: - Gluteus Wound Laterality: Right Cleanser: Normal Saline 3 x Per Week/30 Days Discharge Instructions: Wash your hands with soap and water. Remove old dressing, discard into plastic bag and place into trash. Cleanse the wound with Normal Saline prior to applying a clean dressing using gauze sponges, not tissues or cotton balls. Do not scrub or use excessive force. Pat dry using gauze sponges, not tissue or cotton balls. Primary Dressing: Silvercel Small 2x2 (in/in) (Generic) 3 x Per Week/30 Days Discharge Instructions: Apply Silvercel Small 2x2 (in/in) as instructed Secondary Dressing: Mepilex Border Flex, 4x4 (in/in) (Generic) 3 x Per Week/30 Days Discharge Instructions: Apply to wound as directed. Do not cut. 1. Would recommend currently that we going continue with the silver alginate dressing to both wound locations I think this is a good option. 2. I am also can recommend that we have the patient continue to offload I think that still of utmost importance and I think doing a good job without as best I can anyway. 3. Muscle can recommend the border foam dressing  to cover to help with some padding as well. We will see patient back for reevaluation in 1 week here in the clinic. If anything worsens or changes patient will contact our office for additional recommendations. Electronic Signature(s) Signed: 07/16/2020 3:04:30 PM By: Lenda Kelp PA-C Entered By: Lenda Kelp on 07/16/2020 15:04:29 [name], [name] ([id number]) -------------------------------------------------------------------------------- SuperBill Details Patient Name: [name] Date of Service: 07/16/2020 Medical Record Number: [id number] Patient Account Number: [id number] Date of Birth/Sex: [birth year] (87 y.o. F) Treating RN: Rogers Blocker Primary Care Provider: Ellwood Dense Other Clinician: Lolita Cram Referring Provider: Ellwood Dense Treating Provider/Extender: Rowan Blase in Treatment: 2 Diagnosis Coding ICD-10 Codes Code Description [phone] Pressure ulcer of sacral region, stage 3 L89.313 Pressure ulcer of right buttock, stage 3 M62.81 Muscle weakness (generalized) F01.50 Vascular dementia without behavioral disturbance Z86.718 Personal history of other venous thrombosis and embolism L03.818 Cellulitis of other sites Facility Procedures CPT4 Code: [id number] Description: 99213 - WOUND CARE VISIT-LEV 3 EST PT Modifier: Quantity: 1 Physician Procedures CPT4 Code: [id number] Description: 99214 - WC PHYS LEVEL 4 - EST PT Modifier: Quantity: 1 CPT4 Code: Description: ICD-10 Diagnosis Description L89.153 Pressure ulcer of sacral region, stage 3 L89.313 Pressure ulcer of right buttock, stage 3 M62.81 Muscle weakness (generalized) F01.50 Vascular dementia without behavioral disturbance Modifier: Quantity: Electronic Signature(s) Signed: 07/16/2020 3:04:43 PM By: Lenda Kelp PA-C Entered By: Lenda Kelp on 07/16/2020 15:04:43

## 2020-07-23 ENCOUNTER — Other Ambulatory Visit: Payer: Self-pay

## 2020-07-23 ENCOUNTER — Encounter: Payer: Medicare Other | Admitting: Physician Assistant

## 2020-07-23 DIAGNOSIS — L89313 Pressure ulcer of right buttock, stage 3: Secondary | ICD-10-CM | POA: Diagnosis not present

## 2020-07-23 NOTE — Progress Notes (Addendum)
[name], Deborah N. ([id number]) Visit Report for 07/23/2020 Chief Complaint Document Details Patient Name: [name], Deborah N. Date of Service: 07/23/2020 2:15 PM Medical Record Number: [id number] Patient Account Number: [id number] Date of Birth/Sex: 12/06/1927 (87 y.o. F) Treating RN: [name], [name] Primary Care Provider: Ellwood Denseumball, Alison Other Clinician: Referring Provider: Ellwood Denseumball, Alison Treating Provider/Extender: Rowan BlaseStone, Leatrice Parilla Weeks in Treatment: 3 Information Obtained from: Patient Chief Complaint Gluteal and right ear ulcers Electronic Signature(s) Signed: 07/23/2020 3:03:02 PM By: Lenda KelpStone III, Woods Gangemi PA-C Entered By: Lenda KelpStone III, Charlei Ramsaran on 07/23/2020 15:03:02 Valverde, Brooke BonitoJERALDINE N. ([id number]) -------------------------------------------------------------------------------- HPI Details Patient Name: [name], Deborah N. Date of Service: 07/23/2020 2:15 PM Medical Record Number: [id number] Patient Account Number: [id number] Date of Birth/Sex: 12/06/1927 (87 y.o. F) Treating RN: [name], [name] Primary Care Provider: Ellwood Denseumball, Alison Other Clinician: Referring Provider: Ellwood Denseumball, Alison Treating Provider/Extender: Rowan BlaseStone, [name] Weeks in Treatment: 3 History of Present Illness HPI Description: 05/02/2020 upon evaluation today patient appears to be doing somewhat poorly in regard to her gluteal region. She has moisture associated skin breakdown in this area there is a lot of irritation. With that being said unfortunately pretty much anything that is put on this area causes it to become irritated and the patient will apparently get her hands back there and pull off any dressings and mess with the wound scratching a lot. With that being said I think this is the biggest issue at this point. There does not appear to be any signs of active infection which is good and again I do not see any signs of deep tissue injury. She has a small area on the posterior aspect of her auricle but again I think that  is pretty much healed based on what I am seeing today. Patient does have dementia unfortunately she has congestive heart failure and all this began when she broke her hip and was in rehab in September 2021. 07/02/2020 patient appears to be doing poorly currently in regard to issues that she is having with continued moisture associated breakdown. There also appears to be some pressure related issues going on at this point as well. I previously saw her May 02, 2020. Unfortunately since that time her daughter tells me she has continued to sit quite frequently and in fact that is a big issue here as far as offloading is concerned she is not getting up and moving around as frequently as we would like to see. With that being said that has led to pressure related breakdown of the skin unfortunately. She does have altered mental status this means that unfortunately secondary to the dementia she is really not perceiving what she needs to do for herself as far as being cautious and careful is concerned and allowing this to heal appropriately. Again this is the big struggle I think although her family seems to be doing a good job trying to change the dressings and trying to keep things under control here. 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 The latter is doing well. Unfortunately she comes in today with the wound over the sacrum having gross purulent drainage. I have obtained a culture here. She does not appear to be systemically unwell but her daughter reports she simply will not leave the dressing in place and by looks of think she is scratching at the area. She has vascular dementia. 07/16/2020 upon evaluation today patient appears to be doing well with regard to her wounds. In  fact I feel like she is doing much better than she has last week. Fortunately there is no signs of active infection at this time. Her culture was  negative and showed only normal skin flora but the Augmentin does seem to have helped. Well5/12/2020 upon evaluation today patient appears to be doing currently in regard to her wounds. She is showing signs of improvement which is great news and overall very pleased with where things stand today. No fevers, chills, nausea, vomiting, or diarrhea. Overall I think that she is making good progress. She still continues to mess with the dressings her daughter tells me. Obviously this is not good as I would really like to consider the possibility of a wound VAC but again I am not sure that she would leave this alone in order for her to be able to function appropriately to be honest. That I think would be the biggest concern here. In lieu of that I think is just going to take time for this to really heal through basically granulation in slowly but surely. Electronic Signature(s) Signed: 07/23/2020 3:38:35 PM By: Lenda Kelp PA-C Entered By: Lenda Kelp on 07/23/2020 15:38:35 Fennell, [name] ([id number]) -------------------------------------------------------------------------------- Physical Exam Details Patient Name: [name], [name]. Date of Service: 07/23/2020 2:15 PM Medical Record Number: [id number] Patient Account Number: [id number] Date of Birth/Sex: October 25, 1927 (87 y.o. F) Treating RN: Deborah Blocker Primary Care Provider: Ellwood Dense Other Clinician: Referring Provider: Ellwood Dense Treating Provider/Extender: Rowan Blase in Treatment: 3 Constitutional Well-nourished and well-hydrated in no acute distress. Respiratory normal breathing without difficulty. Psychiatric this patient is able to make decisions and demonstrates good insight into disease process. Alert and Oriented x 3. pleasant and cooperative. Notes Upon inspection patient's wound bed actually showed signs of good granulation epithelization at this point. There does not appear to be any evidence of  infection and overall I think she is doing quite well. In general I am very pleased with where things stand currently. Electronic Signature(s) Signed: 07/23/2020 3:38:55 PM By: Lenda Kelp PA-C Entered By: Lenda Kelp on 07/23/2020 15:38:54 Maquon, [name] ([id number]) -------------------------------------------------------------------------------- Physician Orders Details Patient Name: Deborah Schlein Date of Service: 07/23/2020 2:15 PM Medical Record Number: [id number] Patient Account Number: [id number] Date of Birth/Sex: December 04, 1927 (87 y.o. F) Treating RN: Deborah Blocker Primary Care Provider: Ellwood Dense Other Clinician: Referring Provider: Ellwood Dense Treating Provider/Extender: Rowan Blase in Treatment: 3 Verbal / Phone Orders: No Diagnosis Coding ICD-10 Coding Code Description L89.153 Pressure ulcer of sacral region, stage 3 L89.313 Pressure ulcer of right buttock, stage 3 M62.81 Muscle weakness (generalized) F01.50 Vascular dementia without behavioral disturbance Z86.718 Personal history of other venous thrombosis and embolism L03.818 Cellulitis of other sites Follow-up Appointments o Return Appointment in 1 week. Bathing/ Shower/ Hygiene o Clean wound with Normal Saline or wound cleanser. Additional Orders / Instructions o Follow Nutritious Diet and Increase Protein Intake Medications-Please add to medication list. o P.O. Antibiotics - complete antibiotics Wound Treatment Wound #4 - Sacrum Cleanser: Normal Saline 3 x Per Week/30 Days Discharge Instructions: Wash your hands with soap and water. Remove old dressing, discard into plastic bag and place into trash. Cleanse the wound with Normal Saline prior to applying a clean dressing using gauze sponges, not tissues or cotton balls. Do not scrub or use excessive force. Pat dry using gauze sponges, not tissue or cotton balls. Primary Dressing: Silvercel Small 2x2 (in/in) (Generic) 3 x  Per Week/30 Days Discharge Instructions: Apply Silvercel Small  2x2 (in/in) as instructed-Tuck in undermining Secondary Dressing: Mepilex Border Flex, 4x4 (in/in) (DME) (Generic) 3 x Per Week/30 Days Discharge Instructions: Apply to wound as directed. Do not cut. Wound #5 - Gluteus Wound Laterality: Right Cleanser: Normal Saline 3 x Per Week/30 Days Discharge Instructions: Wash your hands with soap and water. Remove old dressing, discard into plastic bag and place into trash. Cleanse the wound with Normal Saline prior to applying a clean dressing using gauze sponges, not tissues or cotton balls. Do not scrub or use excessive force. Pat dry using gauze sponges, not tissue or cotton balls. Primary Dressing: Silvercel Small 2x2 (in/in) (Generic) 3 x Per Week/30 Days Discharge Instructions: Apply Silvercel Small 2x2 (in/in) as instructed Secondary Dressing: Mepilex Border Flex, 4x4 (in/in) (Generic) 3 x Per Week/30 Days Discharge Instructions: Apply to wound as directed. Do not cut. Electronic Signature(s) Signed: 07/23/2020 5:02:57 PM By: Lenda Kelp PA-C Signed: 07/23/2020 5:07:36 PM By: Lajean Manes RN Previous Signature: 07/23/2020 3:56:49 PM Version By: Phillis Haggis, Dondra Prader RN Joye, [name] ([id number]) Entered By: Phillis Haggis, Dondra Prader on 07/23/2020 16:01:52 Abdelaziz, [name] ([id number]) -------------------------------------------------------------------------------- Problem List Details Patient Name: CURRY, DULSKI. Date of Service: 07/23/2020 2:15 PM Medical Record Number: [id number] Patient Account Number: [id number] Date of Birth/Sex: 1927/10/25 (87 y.o. F) Treating RN: Deborah Blocker Primary Care Provider: Ellwood Dense Other Clinician: Referring Provider: Ellwood Dense Treating Provider/Extender: Rowan Blase in Treatment: 3 Active Problems ICD-10 Encounter Code Description Active Date MDM Diagnosis L89.153 Pressure ulcer of sacral  region, stage 3 07/02/2020 No Yes L89.313 Pressure ulcer of right buttock, stage 3 07/02/2020 No Yes M62.81 Muscle weakness (generalized) 07/02/2020 No Yes F01.50 Vascular dementia without behavioral disturbance 07/02/2020 No Yes Z86.718 Personal history of other venous thrombosis and embolism 07/02/2020 No Yes L03.818 Cellulitis of other sites 07/11/2020 No Yes Inactive Problems ICD-10 Code Description Active Date Inactive Date I10 Essential (primary) hypertension 07/02/2020 07/02/2020 Resolved Problems Electronic Signature(s) Signed: 07/23/2020 3:02:49 PM By: Lenda Kelp PA-C Entered By: Lenda Kelp on 07/23/2020 15:02:49 Cousins, [name] ([id number]) -------------------------------------------------------------------------------- Progress Note Details Patient Name: Deborah Schlein Date of Service: 07/23/2020 2:15 PM Medical Record Number: [id number] Patient Account Number: [id number] Date of Birth/Sex: 1928/03/08 (87 y.o. F) Treating RN: Deborah Blocker Primary Care Provider: Ellwood Dense Other Clinician: Referring Provider: Ellwood Dense Treating Provider/Extender: Rowan Blase in Treatment: 3 Subjective Chief Complaint Information obtained from Patient Gluteal and right ear ulcers History of Present Illness (HPI) 05/02/2020 upon evaluation today patient appears to be doing somewhat poorly in regard to her gluteal region. She has moisture associated skin breakdown in this area there is a lot of irritation. With that being said unfortunately pretty much anything that is put on this area causes it to become irritated and the patient will apparently get her hands back there and pull off any dressings and mess with the wound scratching a lot. With that being said I think this is the biggest issue at this point. There does not appear to be any signs of active infection which is good and again I do not see any signs of deep tissue injury. She has a small area on the  posterior aspect of her auricle but again I think that is pretty much healed based on what I am seeing today. Patient does have dementia unfortunately she has congestive heart failure and all this began when she broke her hip and was in rehab in September 2021. 07/02/2020 patient appears to be doing  poorly currently in regard to issues that she is having with continued moisture associated breakdown. There also appears to be some pressure related issues going on at this point as well. I previously saw her May 02, 2020. Unfortunately since that time her daughter tells me she has continued to sit quite frequently and in fact that is a big issue here as far as offloading is concerned she is not getting up and moving around as frequently as we would like to see. With that being said that has led to pressure related breakdown of the skin unfortunately. She does have altered mental status this means that unfortunately secondary to the dementia she is really not perceiving what she needs to do for herself as far as being cautious and careful is concerned and allowing this to heal appropriately. Again this is the big struggle I think although her family seems to be doing a good job trying to change the dressings and trying to keep things under control here. 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 The latter is doing well. Unfortunately she comes in today with the wound over the sacrum having gross purulent drainage. I have obtained a culture here. She does not appear to be systemically unwell but her daughter reports she simply will not leave the dressing in place and by looks of think she is scratching at the area. She has vascular dementia. 07/16/2020 upon evaluation today patient appears to be doing well with regard to her wounds. In fact I feel like she is doing much better than she has last week. Fortunately there is no signs  of active infection at this time. Her culture was negative and showed only normal skin flora but the Augmentin does seem to have helped. Well5/12/2020 upon evaluation today patient appears to be doing currently in regard to her wounds. She is showing signs of improvement which is great news and overall very pleased with where things stand today. No fevers, chills, nausea, vomiting, or diarrhea. Overall I think that she is making good progress. She still continues to mess with the dressings her daughter tells me. Obviously this is not good as I would really like to consider the possibility of a wound VAC but again I am not sure that she would leave this alone in order for her to be able to function appropriately to be honest. That I think would be the biggest concern here. In lieu of that I think is just going to take time for this to really heal through basically granulation in slowly but surely. Objective Constitutional Well-nourished and well-hydrated in no acute distress. Vitals Time Taken: 2:24 PM, Weight: 134 lbs, Temperature: 97.8 F, Pulse: 66 bpm, Respiratory Rate: 18 breaths/min, Blood Pressure: 122/65 mmHg. Respiratory normal breathing without difficulty. Psychiatric this patient is able to make decisions and demonstrates good insight into disease process. Alert and Oriented x 3. pleasant and cooperative. General Notes: Upon inspection patient's wound bed actually showed signs of good granulation epithelization at this point. There does not appear to be any evidence of infection and overall I think she is doing quite well. In general I am very pleased with where things stand currently. REGLA, FITZGIBBON ([id number]) Integumentary (Hair, Skin) Wound #4 status is Open. Original cause of wound was Gradually Appeared. The date acquired was: 06/07/2020. The wound has been in treatment 3 weeks. The wound is located on the Sacrum. The  wound measures 2.2cm length x 0.7cm width x 1cm depth;  1.21cm^2 area and 1.21cm^3 volume. There is Fat Layer (Subcutaneous Tissue) exposed. There is no tunneling noted, however, there is undermining starting at 12:00 and ending at 1:00 with a maximum distance of 1.9cm. There is a medium amount of serosanguineous drainage noted. There is large (67-100%) pink granulation within the wound bed. There is no necrotic tissue within the wound bed. Wound #5 status is Open. Original cause of wound was Gradually Appeared. The date acquired was: 06/14/2020. The wound has been in treatment 3 weeks. The wound is located on the Right Gluteus. The wound measures 0.1cm length x 0.1cm width x 0.1cm depth; 0.008cm^2 area and 0.001cm^3 volume. There is no tunneling or undermining noted. There is a none present amount of drainage noted. The wound margin is flat and intact. There is no granulation within the wound bed. There is a large (67-100%) amount of necrotic tissue within the wound bed including Eschar. Assessment Active Problems ICD-10 Pressure ulcer of sacral region, stage 3 Pressure ulcer of right buttock, stage 3 Muscle weakness (generalized) Vascular dementia without behavioral disturbance Personal history of other venous thrombosis and embolism Cellulitis of other sites Plan Follow-up Appointments: Return Appointment in 1 week. Bathing/ Shower/ Hygiene: Clean wound with Normal Saline or wound cleanser. Additional Orders / Instructions: Follow Nutritious Diet and Increase Protein Intake Medications-Please add to medication list.: P.O. Antibiotics - complete antibiotics WOUND #4: - Sacrum Wound Laterality: Cleanser: Normal Saline 1 x Per Day/30 Days Discharge Instructions: Wash your hands with soap and water. Remove old dressing, discard into plastic bag and place into trash. Cleanse the wound with Normal Saline prior to applying a clean dressing using gauze sponges, not tissues or cotton balls. Do not scrub or use excessive force. Pat dry using gauze  sponges, not tissue or cotton balls. Primary Dressing: Silvercel Small 2x2 (in/in) (Generic) 1 x Per Day/30 Days Discharge Instructions: Apply Silvercel Small 2x2 (in/in) as instructed-Tuck in undermining Secondary Dressing: Mepilex Border Flex, 6x6 (in/in) 1 x Per Day/30 Days Discharge Instructions: Apply to wound as directed. Do not cut. WOUND #5: - Gluteus Wound Laterality: Right Cleanser: Normal Saline 3 x Per Week/30 Days Discharge Instructions: Wash your hands with soap and water. Remove old dressing, discard into plastic bag and place into trash. Cleanse the wound with Normal Saline prior to applying a clean dressing using gauze sponges, not tissues or cotton balls. Do not scrub or use excessive force. Pat dry using gauze sponges, not tissue or cotton balls. Primary Dressing: Silvercel Small 2x2 (in/in) (Generic) 3 x Per Week/30 Days Discharge Instructions: Apply Silvercel Small 2x2 (in/in) as instructed Secondary Dressing: Mepilex Border Flex, 4x4 (in/in) (Generic) 3 x Per Week/30 Days Discharge Instructions: Apply to wound as directed. Do not cut. 1. Would recommend that we going to continue with wound care measures as before and the patient is in agreement with plan this includes the use of the silver alginate dressing to the gluteal region. I think that is doing a good job. 2. With regard to the main sacral wound I would recommend as well that we continue with the silver cell I think this is still doing a good job as far as packing is concerned. We may switch out to a different dressing going forward depending how things progress. 3. I think it is good for the patient to continue with appropriate offloading still this is good to be of utmost importance. We will see patient back for  reevaluation in 1 week here in the clinic. If anything worsens or changes patient will contact our office for additional recommendations. MABELL, ESGUERRA ([id number]) Electronic Signature(s) Signed:  07/23/2020 3:40:05 PM By: Lenda Kelp PA-C Entered By: Lenda Kelp on 07/23/2020 15:40:05 Florido, [name] ([id number]) -------------------------------------------------------------------------------- SuperBill Details Patient Name: Deborah Schlein Date of Service: 07/23/2020 Medical Record Number: [id number] Patient Account Number: [id number] Date of Birth/Sex: 1927/10/10 (87 y.o. F) Treating RN: Deborah Blocker Primary Care Provider: Ellwood Dense Other Clinician: Referring Provider: Ellwood Dense Treating Provider/Extender: Rowan Blase in Treatment: 3 Diagnosis Coding ICD-10 Codes Code Description [id number] Pressure ulcer of sacral region, stage 3 L89.313 Pressure ulcer of right buttock, stage 3 M62.81 Muscle weakness (generalized) F01.50 Vascular dementia without behavioral disturbance Z86.718 Personal history of other venous thrombosis and embolism L03.818 Cellulitis of other sites Facility Procedures CPT4 Code: [id number] Description: 99213 - WOUND CARE VISIT-LEV 3 EST PT Modifier: Quantity: 1 Physician Procedures CPT4 Code: [id number] Description: 99214 - WC PHYS LEVEL 4 - EST PT Modifier: Quantity: 1 CPT4 Code: Description: ICD-10 Diagnosis Description L89.153 Pressure ulcer of sacral region, stage 3 L89.313 Pressure ulcer of right buttock, stage 3 M62.81 Muscle weakness (generalized) F01.50 Vascular dementia without behavioral disturbance Modifier: Quantity: Electronic Signature(s) Signed: 07/23/2020 3:40:30 PM By: Lenda Kelp PA-C Entered By: Lenda Kelp on 07/23/2020 15:40:30

## 2020-07-24 NOTE — Progress Notes (Signed)
[name], [name] ([id number]) Visit Report for 07/23/2020 Arrival Information Details Patient Name: [name], [name]. Date of Service: 07/23/2020 2:15 PM Medical Record Number: [id number] Patient Account Number: [id number] Date of Birth/Sex: 1927/09/23 (87 y.o. F) Treating RN: [name] Primary Care [name]: [name] Other Clinician: Referring [name]: [name] Treating [name]/Extender: [name] in Treatment: 3 Visit Information History Since Last Visit Added or deleted any medications: No Patient Arrived: Wheel Chair Had a fall or experienced change in No Arrival Time: 14:25 activities of daily living that may affect Accompanied By: daughter risk of falls: Transfer Assistance: None Hospitalized since last visit: No Patient Identification Verified: Yes Pain Present Now: No Secondary Verification Process Completed: Yes Patient Has Alerts: Yes Patient Alerts: Eliquis STOPPED 04/16/20 Electronic Signature(s) Signed: 07/23/2020 4:52:40 PM By: [name] Entered By: [name] on 07/23/2020 14:26:11 [name] ([id number]) -------------------------------------------------------------------------------- Clinic Level of Care Assessment Details Patient Name: [name] Date of Service: 07/23/2020 2:15 PM Medical Record Number: [id number] Patient Account Number: [id number] Date of Birth/Sex: 01-Jan-1928 (87 y.o. F) Treating RN: [name] Primary Care [name]: [name] Other Clinician: Referring Allante Beane: [name] Treating [name]/Extender: [name] in Treatment: 3 Clinic Level of Care Assessment Items TOOL 4 Quantity Score X - Use when only an EandM is performed on FOLLOW-UP visit 1 0 ASSESSMENTS - Nursing Assessment / Reassessment X - Reassessment of Co-morbidities (includes updates in patient status) 1 10 X- 1 5 Reassessment of Adherence to Treatment Plan ASSESSMENTS - Wound and Skin  Assessment / Reassessment []  - Simple Wound Assessment / Reassessment - one wound 0 X- 2 5 Complex Wound Assessment / Reassessment - multiple wounds []  - 0 Dermatologic / Skin Assessment (not related to wound area) ASSESSMENTS - Focused Assessment []  - Circumferential Edema Measurements - multi extremities 0 []  - 0 Nutritional Assessment / Counseling / Intervention []  - 0 Lower Extremity Assessment (monofilament, tuning fork, pulses) []  - 0 Peripheral Arterial Disease Assessment (using hand held doppler) ASSESSMENTS - Ostomy and/or Continence Assessment and Care []  - Incontinence Assessment and Management 0 []  - 0 Ostomy Care Assessment and Management (repouching, etc.) PROCESS - Coordination of Care X - Simple Patient / Family Education for ongoing care 1 15 []  - 0 Complex (extensive) Patient / Family Education for ongoing care []  - 0 Staff obtains , Records, Test Results / Process Orders []  - 0 Staff telephones HHA, Nursing Homes / Clarify orders / etc []  - 0 Routine Transfer to another Facility (non-emergent condition) []  - 0 Routine Hospital Admission (non-emergent condition) []  - 0 New Admissions / / Ordering NPWT, Apligraf, etc. []  - 0 Emergency Hospital Admission (emergent condition) X- 1 10 Simple Discharge Coordination []  - 0 Complex (extensive) Discharge Coordination PROCESS - Special Needs []  - Pediatric / Minor Patient Management 0 []  - 0 Isolation Patient Management []  - 0 Hearing / Language / Visual special needs []  - 0 Assessment of Community assistance (transportation, D/C planning, etc.) []  - 0 Additional assistance / Altered mentation []  - 0 Support Surface(s) Assessment (bed, cushion, seat, etc.) INTERVENTIONS - Wound Cleansing / Measurement Handa, Deborah N. ( ) []  - 0 Simple Wound Cleansing - one wound X- 2 5 Complex Wound Cleansing - multiple wounds X- 1 5 Wound Imaging (photographs - any  number of wounds) []  - 0 Wound Tracing (instead of photographs) []  - 0 Simple Wound Measurement - one wound X- 2 5 Complex Wound Measurement - multiple wounds INTERVENTIONS - Wound  Dressings []  - Small Wound Dressing one or multiple wounds 0 X- 1 15 Medium Wound Dressing one or multiple wounds []  - 0 Large Wound Dressing one or multiple wounds []  - 0 Application of Medications - topical []  - 0 Application of Medications - injection INTERVENTIONS - Miscellaneous []  - External ear exam 0 []  - 0 Specimen Collection (cultures, biopsies, blood, body fluids, etc.) []  - 0 Specimen(s) / Culture(s) sent or taken to Lab for analysis []  - 0 Patient Transfer (multiple staff / Nurse, adultHoyer Lift / Similar devices) []  - 0 Simple Staple / Suture removal (25 or less) []  - 0 Complex Staple / Suture removal (26 or more) []  - 0 Hypo / Hyperglycemic Management (close monitor of Blood Glucose) []  - 0 Ankle / Brachial Index (ABI) - do not check if billed separately X- 1 5 Vital Signs Has the patient been seen at the hospital within the last three years: Yes Total Score: 95 Level Of Care: New/Established - Level 3 Electronic Signature(s) Signed: 07/23/2020 3:56:49 PM By: Phillis HaggisSanchez Pereyda, Dondra PraderKenia RN Entered By: Phillis HaggisSanchez Pereyda, Kenia on 07/23/2020 15:08:47 Mancusi, Deborah BonitoJERALDINE N. ([id number]) -------------------------------------------------------------------------------- Encounter Discharge Information Details Patient Name: Deborah SchleinGRIFFIN, Deborah N. Date of Service: 07/23/2020 2:15 PM Medical Record Number: [id number] Patient Account Number: [id number] Date of Birth/Sex: 05-Aug-1927 (87 y.o. F) Treating RN: Deborah BlockerSanchez, Kenia Primary Care Nikala Walsworth: Deborah Denseumball, Alison Other Clinician: Referring Demonta Wombles: Deborah Denseumball, Alison Treating Kayshaun Polanco/Extender: Deborah BlaseStone, Hoyt Weeks in Treatment: 3 Encounter Discharge Information Items Discharge Condition: Stable Ambulatory Status: Ambulatory Discharge Destination:  Home Transportation: Private Auto Accompanied By: daughter Schedule Follow-up Appointment: Yes Clinical Summary of Care: Electronic Signature(s) Signed: 07/23/2020 4:52:40 PM By: Deborah CramBurnette, Kyara Entered By: Deborah CramBurnette, Kyara on 07/23/2020 15:23:03 Luca, Deborah BonitoJERALDINE N. ([id number]) -------------------------------------------------------------------------------- Lower Extremity Assessment Details Patient Name: Deborah SchleinGRIFFIN, Kymber N. Date of Service: 07/23/2020 2:15 PM Medical Record Number: [id number] Patient Account Number: [id number] Date of Birth/Sex: 05-Aug-1927 (87 y.o. F) Treating RN: Deborah BlockerSanchez, Kenia Primary Care Nakeda Lebron: Deborah Denseumball, Alison Other Clinician: Referring Alistair Senft: Deborah Denseumball, Alison Treating Christohper Dube/Extender: Deborah BlaseStone, Hoyt Weeks in Treatment: 3 Electronic Signature(s) Signed: 07/23/2020 3:56:49 PM By: Lajean ManesSanchez Pereyda, Kenia RN Signed: 07/23/2020 4:52:40 PM By: Deborah CramBurnette, Kyara Entered By: Deborah CramBurnette, Kyara on 07/23/2020 14:26:50 Fiola, Deborah BonitoJERALDINE N. ([id number]) -------------------------------------------------------------------------------- Multi Wound Chart Details Patient Name: Deborah SchleinGRIFFIN, Shanley N. Date of Service: 07/23/2020 2:15 PM Medical Record Number: [id number] Patient Account Number: [id number] Date of Birth/Sex: 05-Aug-1927 (87 y.o. F) Treating RN: Deborah BlockerSanchez, Kenia Primary Care Ransom Nickson: Deborah Denseumball, Alison Other Clinician: Referring Javia Dillow: Deborah Denseumball, Alison Treating Tanvir Hipple/Extender: Deborah BlaseStone, Hoyt Weeks in Treatment: 3 Vital Signs Height(in): Pulse(bpm): 66 Weight(lbs): 134 Blood Pressure(mmHg): 122/65 Body Mass Index(BMI): Temperature(F): 97.8 Respiratory Rate(breaths/min): 18 Photos: [N/A:N/A] Wound Location: Sacrum Right Gluteus N/A Wounding Event: Gradually Appeared Gradually Appeared N/A Primary Etiology: Pressure Ulcer Pressure Ulcer N/A Comorbid History: Cataracts, Arrhythmia, Congestive Cataracts, Arrhythmia, Congestive N/A Heart Failure, Hypertension,  Heart Failure, Hypertension, Dementia Dementia Date Acquired: 06/07/2020 06/14/2020 N/A Weeks of Treatment: 3 3 N/A Wound Status: Open Open N/A Measurements L x W x D (cm) 2.2x0.7x1 0.1x0.1x0.1 N/A Area (cm) : 1.21 0.008 N/A Volume (cm) : 1.21 0.001 N/A % Reduction in Area: 35.80% 98.30% N/A % Reduction in Volume: 8.30% 98.90% N/A Starting Position 1 (o'clock): 12 Ending Position 1 (o'clock): 1 Maximum Distance 1 (cm): 1.9 Undermining: Yes No N/A Classification: Unstageable/Unclassified Category/Stage III N/A Exudate Amount: Medium None Present N/A Exudate Type: Serosanguineous N/A N/A Exudate Color: red, brown N/A N/A Wound Margin: N/A Flat and Intact N/A Granulation Amount: Large (67-100%) None Present (0%) N/A Granulation  Quality: Pink N/A N/A Necrotic Amount: None Present (0%) Large (67-100%) N/A Necrotic Tissue: N/A Eschar N/A Exposed Structures: Fat Layer (Subcutaneous Tissue): Fascia: No N/A Yes Fat Layer (Subcutaneous Tissue): Fascia: No No Tendon: No Tendon: No Muscle: No Muscle: No Joint: No Joint: No Bone: No Bone: No Epithelialization: None Medium (34-66%) N/A Treatment Notes Electronic Signature(s) Signed: 07/23/2020 3:56:49 PM By: Phillis Haggis, Dondra Prader RN Bugarin, [name] ([id number]) Entered By: Phillis Haggis, Dondra Prader on 07/23/2020 15:05:58 Lenart, [name] ([id number]) -------------------------------------------------------------------------------- Multi-Disciplinary Care Plan Details Patient Name: [name] Date of Service: 07/23/2020 2:15 PM Medical Record Number: [id number] Patient Account Number: [id number] Date of Birth/Sex: January 19, 1928 (87 y.o. F) Treating RN: [name] Primary Care Azaela Caracci: [name] Other Clinician: Referring Gayle Collard: [name] Treating Tevis Conger/Extender: [name] in Treatment: 3 Active Inactive Pressure Nursing Diagnoses: Knowledge deficit related to causes and risk factors  for pressure ulcer development Knowledge deficit related to management of pressures ulcers Goals: Patient will remain free from development of additional pressure ulcers Date Initiated: 07/02/2020 Target Resolution Date: 08/01/2020 Goal Status: Active Patient will remain free of pressure ulcers Date Initiated: 07/02/2020 Target Resolution Date: 08/01/2020 Goal Status: Active Patient/caregiver will verbalize risk factors for pressure ulcer development Date Initiated: 07/02/2020 Target Resolution Date: 07/02/2020 Goal Status: Active Patient/caregiver will verbalize understanding of pressure ulcer management Date Initiated: 07/02/2020 Target Resolution Date: 07/02/2020 Goal Status: Active Interventions: Assess: immobility, friction, shearing, incontinence upon admission and as needed Assess offloading mechanisms upon admission and as needed Assess potential for pressure ulcer upon admission and as needed Provide education on pressure ulcers Notes: Wound/Skin Impairment Nursing Diagnoses: Impaired tissue integrity Goals: Patient/caregiver will verbalize understanding of skin care regimen Date Initiated: 07/02/2020 Target Resolution Date: 07/02/2020 Goal Status: Active Ulcer/skin breakdown will have a volume reduction of 30% by week 4 Date Initiated: 07/02/2020 Target Resolution Date: 08/01/2020 Goal Status: Active Ulcer/skin breakdown will have a volume reduction of 50% by week 8 Date Initiated: 07/02/2020 Target Resolution Date: 09/01/2020 Goal Status: Active Ulcer/skin breakdown will have a volume reduction of 80% by week 12 Date Initiated: 07/02/2020 Target Resolution Date: 10/01/2020 Goal Status: Active Ulcer/skin breakdown will heal within 14 weeks Date Initiated: 07/02/2020 Target Resolution Date: 11/01/2020 Goal Status: Active Interventions: Assess patient/caregiver ability to obtain necessary supplies Assess patient/caregiver ability to perform ulcer/skin care regimen upon  admission and as needed MARICRUZ, LUCERO ([id number]) Assess ulceration(s) every visit Provide education on ulcer and skin care Treatment Activities: Referred to DME Jacqulynn Shappell for dressing supplies : 07/02/2020 Skin care regimen initiated : 07/02/2020 Notes: Electronic Signature(s) Signed: 07/23/2020 3:56:49 PM By: Phillis Haggis, Dondra Prader RN Entered By: Phillis Haggis, Dondra Prader on 07/23/2020 15:05:19 Stange, [name] ([id number]) -------------------------------------------------------------------------------- Pain Assessment Details Patient Name: [name] Date of Service: 07/23/2020 2:15 PM Medical Record Number: [id number] Patient Account Number: [id number] Date of Birth/Sex: 05-12-1927 (87 y.o. F) Treating RN: [name] Primary Care Kamrin Sibley: [name] Other Clinician: Referring Rivan Siordia: [name] Treating Niccolas Loeper/Extender: [name] in Treatment: 3 Active Problems Location of Pain Severity and Description of Pain Patient Has Paino No Site Locations Rate the pain. Current Pain Level: 0 Pain Management and Medication Current Pain Management: Electronic Signature(s) Signed: 07/23/2020 3:56:49 PM By: Phillis Haggis, Dondra Prader RN Signed: 07/23/2020 4:52:40 PM By: [name] Entered By: [name] on 07/23/2020 14:26:43 Therrien, [name] ([id number]) -------------------------------------------------------------------------------- Patient/Caregiver Education Details Patient Name: [name] Date of Service: 07/23/2020 2:15 PM Medical Record Number: [id number] Patient Account Number: [id number] Date of Birth/Gender: 1927/08/04 (  87 y.o. F) Treating RN: [name] Primary Care Physician: [name] Other Clinician: Referring Physician: Ellwood Mack Treating Physician/Extender: [name] in Treatment: 3 Education Assessment Education Provided To: Patient Education Topics Provided Pressure: Methods:  Explain/Verbal Responses: State content correctly Wound/Skin Impairment: Methods: Explain/Verbal Responses: State content correctly Electronic Signature(s) Signed: 07/23/2020 3:56:49 PM By: Phillis Haggis, Dondra Prader RN Entered By: Phillis Haggis, Dondra Prader on 07/23/2020 15:09:05 Dombrosky, [name] ([id number]) -------------------------------------------------------------------------------- Wound Assessment Details Patient Name: [name]. Date of Service: 07/23/2020 2:15 PM Medical Record Number: [id number] Patient Account Number: [id number] Date of Birth/Sex: January 31, 1928 (87 y.o. F) Treating RN: [name] Primary Care Alliah Boulanger: [name] Other Clinician: Referring Kennethia Lynes: [name] Treating Prudence Heiny/Extender: [name] in Treatment: 3 Wound Status Wound Number: 4 Primary Pressure Ulcer Etiology: Wound Location: Sacrum Wound Status: Open Wounding Event: Gradually Appeared Comorbid Cataracts, Arrhythmia, Congestive Heart Failure, Date Acquired: 06/07/2020 History: Hypertension, Dementia Weeks Of Treatment: 3 Clustered Wound: No Photos Wound Measurements Length: (cm) 2.2 % Re Width: (cm) 0.7 % Re Depth: (cm) 1 Epit Area: (cm) 1.21 Tun Volume: (cm) 1.21 Und S E M duction in Area: 35.8% duction in Volume: 8.3% helialization: None neling: No ermining: Yes tarting Position (o'clock): 12 nding Position (o'clock): 1 aximum Distance: (cm) 1.9 Wound Description Classification: Category/Stage III Fou Exudate Amount: Medium Slo Exudate Type: Serosanguineous Exudate Color: red, brown l Odor After Cleansing: No ugh/Fibrino No Wound Bed Granulation Amount: Large (67-100%) Exposed Structure Granulation Quality: Pink Fascia Exposed: No Necrotic Amount: None Present (0%) Fat Layer (Subcutaneous Tissue) Exposed: Yes Tendon Exposed: No Muscle Exposed: No Joint Exposed: No Bone Exposed: No Treatment Notes Wound #4 (Sacrum) Cleanser Normal  Saline Mineer, Enijah N. ([id number]) Discharge Instruction: Wash your hands with soap and water. Remove old dressing, discard into plastic bag and place into trash. Cleanse the wound with Normal Saline prior to applying a clean dressing using gauze sponges, not tissues or cotton balls. Do not scrub or use excessive force. Pat dry using gauze sponges, not tissue or cotton balls. Peri-Wound Care Topical Primary Dressing Silvercel Small 2x2 (in/in) Discharge Instruction: Apply Silvercel Small 2x2 (in/in) as instructed-Tuck in undermining Secondary Dressing Mepilex Border Flex, 6x6 (in/in) Discharge Instruction: Apply to wound as directed. Do not cut. Secured With Compression Wrap Compression Stockings Facilities manager) Signed: 07/23/2020 4:02:31 PM By: Lajean Manes RN Previous Signature: 07/23/2020 3:56:49 PM Version By: Phillis Haggis, Dondra Prader RN Entered By: Phillis Haggis, Kenia on 07/23/2020 16:02:31 Piano, [name] ([id number]) -------------------------------------------------------------------------------- Wound Assessment Details Patient Name: KASMIRA, CACIOPPO. Date of Service: 07/23/2020 2:15 PM Medical Record Number: [id number] Patient Account Number: [id number] Date of Birth/Sex: Jan 29, 1928 (87 y.o. F) Treating RN: [name] Primary Care Yarely Bebee: [name] Other Clinician: Referring Aaliya Maultsby: [name] Treating Muadh Creasy/Extender: [name] in Treatment: 3 Wound Status Wound Number: 5 Primary Pressure Ulcer Etiology: Wound Location: Right Gluteus Wound Status: Open Wounding Event: Gradually Appeared Comorbid Cataracts, Arrhythmia, Congestive Heart Failure, Date Acquired: 06/14/2020 History: Hypertension, Dementia Weeks Of Treatment: 3 Clustered Wound: No Photos Wound Measurements Length: (cm) 0.1 Width: (cm) 0.1 Depth: (cm) 0.1 Area: (cm) 0.008 Volume: (cm) 0.001 % Reduction in Area: 98.3% % Reduction  in Volume: 98.9% Epithelialization: Medium (34-66%) Tunneling: No Undermining: No Wound Description Classification: Category/Stage III Wound Margin: Flat and Intact Exudate Amount: None Present Foul Odor After Cleansing: No Slough/Fibrino No Wound Bed Granulation Amount: None Present (0%) Exposed Structure Necrotic Amount: Large (67-100%) Fascia Exposed: No Necrotic Quality: Eschar Fat Layer (Subcutaneous Tissue)  Exposed: No Tendon Exposed: No Muscle Exposed: No Joint Exposed: No Bone Exposed: No Treatment Notes Wound #5 (Gluteus) Wound Laterality: Right Cleanser Normal Saline Discharge Instruction: Wash your hands with soap and water. Remove old dressing, discard into plastic bag and place into trash. Cleanse the wound with Normal Saline prior to applying a clean dressing using gauze sponges, not tissues or cotton balls. Do not scrub or use excessive force. Pat dry using gauze sponges, not tissue or cotton balls. Peri-Wound Care CIMONE, FAHEY ([id number]) Topical Primary Dressing Silvercel Small 2x2 (in/in) Discharge Instruction: Apply Silvercel Small 2x2 (in/in) as instructed Secondary Dressing Mepilex Border Flex, 4x4 (in/in) Discharge Instruction: Apply to wound as directed. Do not cut. Secured With Compression Wrap Compression Stockings Add-Ons Electronic Signature(s) Signed: 07/23/2020 3:56:49 PM By: Phillis Haggis, Dondra Prader RN Signed: 07/23/2020 4:52:40 PM By: [name] Entered By: [name] on 07/23/2020 14:39:05 Bon, [name] ([id number]) -------------------------------------------------------------------------------- Vitals Details Patient Name: [name] Date of Service: 07/23/2020 2:15 PM Medical Record Number: [id number] Patient Account Number: [id number] Date of Birth/Sex: Jan 11, 1928 (87 y.o. F) Treating RN: [name] Primary Care Jadda Hunsucker: [name] Other Clinician: Referring Danielys Madry: [name] Treating Juvon Teater/Extender: [name] in Treatment: 3 Vital Signs Time Taken: 14:24 Temperature (F): 97.8 Weight (lbs): 134 Pulse (bpm): 66 Respiratory Rate (breaths/min): 18 Blood Pressure (mmHg): 122/65 Reference Range: 80 - 120 mg / dl Electronic Signature(s) Signed: 07/23/2020 4:52:40 PM By: [name] Entered By: [name] on 07/23/2020 14:26:35

## 2020-07-29 ENCOUNTER — Ambulatory Visit (INDEPENDENT_AMBULATORY_CARE_PROVIDER_SITE_OTHER): Payer: Medicare Other | Admitting: Family Medicine

## 2020-07-29 ENCOUNTER — Encounter: Payer: Self-pay | Admitting: Family Medicine

## 2020-07-29 ENCOUNTER — Other Ambulatory Visit: Payer: Self-pay

## 2020-07-29 VITALS — BP 118/68 | HR 74 | Temp 97.9°F | Resp 16 | Ht 64.0 in | Wt 127.0 lb

## 2020-07-29 DIAGNOSIS — E782 Mixed hyperlipidemia: Secondary | ICD-10-CM

## 2020-07-29 DIAGNOSIS — I5022 Chronic systolic (congestive) heart failure: Secondary | ICD-10-CM

## 2020-07-29 DIAGNOSIS — L89159 Pressure ulcer of sacral region, unspecified stage: Secondary | ICD-10-CM | POA: Diagnosis not present

## 2020-07-29 DIAGNOSIS — Z23 Encounter for immunization: Secondary | ICD-10-CM

## 2020-07-29 DIAGNOSIS — I10 Essential (primary) hypertension: Secondary | ICD-10-CM

## 2020-07-29 DIAGNOSIS — R739 Hyperglycemia, unspecified: Secondary | ICD-10-CM

## 2020-07-29 DIAGNOSIS — G301 Alzheimer's disease with late onset: Secondary | ICD-10-CM | POA: Diagnosis not present

## 2020-07-29 DIAGNOSIS — Z9181 History of falling: Secondary | ICD-10-CM

## 2020-07-29 DIAGNOSIS — D692 Other nonthrombocytopenic purpura: Secondary | ICD-10-CM

## 2020-07-29 DIAGNOSIS — F0280 Dementia in other diseases classified elsewhere without behavioral disturbance: Secondary | ICD-10-CM

## 2020-07-29 DIAGNOSIS — E039 Hypothyroidism, unspecified: Secondary | ICD-10-CM

## 2020-07-29 MED ORDER — FUROSEMIDE 20 MG PO TABS: 20.0000 mg | ORAL_TABLET | Freq: Every day | ORAL | 1 refills | Status: AC

## 2020-07-29 MED ORDER — METOPROLOL SUCCINATE ER 50 MG PO TB24: 50.0000 mg | ORAL_TABLET | Freq: Every day | ORAL | 1 refills | Status: AC

## 2020-07-29 NOTE — Progress Notes (Signed)
Name: [name]   MRN: [id number]    DOB: 03/14/1928   Date:07/29/2020       Progress Note  Subjective  Chief Complaint  Follow Up  HPI  Patient is new to me. She came in with her daughter who is her full time caregiver  Discussed long term assistance such as home health care and maybe hospice but daughter states her mother is not very cooperative and will think about it for now  History of DVT left leg: Diagnosed end of 2021 , seeing vascular surgeon, initially on Eliquis but currently only on aspirin , she still has some left lower leg edema, but does not seem to be in pain. She is going back to vascular surgeon next week   Hypothyroidism: taking medication daily, she has lost 7 lbs recently, lack of appetite, no change in bowel movements and does not seem to have dysphagia  Malnutrition: losing weight, lack of appetite. She has lost about 7 lbs in the past few months. Discussed high caloric diet.   Hyperglycemia: we will check A1C , denies polyphagia, polyuria or polydipsia  Hyperlipidemia: not on statin therapy, but daughter is interested in checking her levels  Senile purpura: usually on arms and stable, reassurance given   Dementia: going on for years, on Namenda, daughter is her caregiver, stable for a while, has periods of agitation and does not sleep through the night, but also naps throughout the day   CHF: chronic, sees Dr. Darrold Junker, she seems to have some SOB with activity, but no wheezing or cough. Mild lower extremity edema, only on beta-blocker 75 mg per day, and bp is low, she also seems to get dizzy when she stands up in am, we will decrease dose to 50 mg daily and consider adding ace ( low dose ) on her next visit   Pressure ulcer/sacrum: going to wound center, has hospital base.  Patient Active Problem List   Diagnosis Date Noted  . History of fracture of right hip 01/24/2020  . DVT of deep femoral vein, left (HCC)   . Left leg DVT (HCC) 01/19/2020   . Pressure injury of skin 01/19/2020  . Closed right hip fracture (HCC) 12/12/2019  . Chronic systolic heart failure (HCC) 10/12/2019  . At risk for fall due to comorbid condition 10/12/2019  . Senile purpura (HCC) 08/31/2019  . Dilated cardiomyopathy (HCC) 05/03/2019  . Osteopenia 06/22/2017  . HTN (hypertension) 02/10/2017  . Dementia of the Alzheimer's type without behavioral disturbance (HCC) 11/04/2015  . Gradual-onset memory impairment 10/25/2015  . Hypothyroidism 05/07/2015  . Hyperlipidemia 05/07/2015  . Hyperglycemia 05/07/2015  . Aortic insufficiency 06/14/2013    Past Surgical History:  Procedure Laterality Date  . INTRAMEDULLARY (IM) NAIL INTERTROCHANTERIC Right 12/13/2019   Procedure: INTRAMEDULLARY (IM) NAIL INTERTROCHANTRIC;  Surgeon: Christena Flake, MD;  Location: [facility] ORS;  Service: Orthopedics;  Laterality: Right;    Family History  Problem Relation Age of Onset  . Cancer Father   . Hyperlipidemia Sister   . Heart disease Brother   . Stroke Neg Hx     Social History   Tobacco Use  . Smoking status: Never Smoker  . Smokeless tobacco: Never Used  . Tobacco comment: smoking cessation materials not required  Substance Use Topics  . Alcohol use: No    Alcohol/week: 0.0 standard drinks     Current Outpatient Medications:  .  acetaminophen (TYLENOL) 325 MG tablet, Take 1-2 tablets (325-650 mg total) by mouth every 6 (six)  hours as needed for mild pain (pain score 1-3 or temp > 100.5)., Disp: , Rfl:  .  Calcium 600-200 MG-UNIT tablet, Take 1 tablet by mouth daily., Disp: , Rfl:  .  cholecalciferol (VITAMIN D3) 25 MCG (1000 UNIT) tablet, Take 1,000 Units by mouth daily., Disp: , Rfl:  .  levothyroxine (EUTHYROX) 75 MCG tablet, Take 1 tablet (75 mcg total) by mouth daily before breakfast., Disp: 30 tablet, Rfl: 0 .  memantine (NAMENDA) 10 MG tablet, Take 1 tablet (10 mg total) by mouth 2 (two) times daily., Disp: 180 tablet, Rfl: 1 .  metoprolol succinate  (TOPROL XL) 50 MG 24 hr tablet, Take 1 tablet (50 mg total) by mouth daily. Take with or immediately following a meal., Disp: 90 tablet, Rfl: 1 .  furosemide (LASIX) 20 MG tablet, Take 1 tablet (20 mg total) by mouth daily., Disp: 90 tablet, Rfl: 1  No Known Allergies  I personally reviewed active problem list, medication list, allergies, family history, social history, health maintenance with the patient/caregiver today.   ROS  Ten systems reviewed and is negative except as mentioned in HPI   Objective  Vitals:   07/29/20 1443  BP: 118/68  Pulse: 74  Resp: 16  Temp: 97.9 F (36.6 C)  TempSrc: Oral  SpO2: 97%  Weight: 127 lb (57.6 kg)  Height: 5\' 4"  (1.626 m)    Body mass index is 21.8 kg/m.  Physical Exam  Constitutional: Patient appears well-developed and frail/malnourished  No distress.  HEENT: head atraumatic, normocephalic, pupils equal and reactive to light,  neck supple Cardiovascular: Normal rate, regular rhythm and normal heart sounds.  1/6  murmur heard. lower extremity edema, left lower leg more swollen than right, slightly tender  Pulmonary/Chest: Effort normal ,crackles on both lung bases No respiratory distress. Abdominal: Soft.  There is no tenderness. Psychiatric: calm and cooperative   Recent Results (from the past 2160 hour(s))  Aerobic Culture w Gram Stain (superficial specimen)     Status: None   Collection Time: 07/11/20 11:30 AM   Specimen: Wound  Result Value Ref Range   Specimen Description      WOUND Performed at [facility], [address] [place], [place]    Special Requests      SACRUM Performed at [facility], [address] [place], [place]    Gram Stain      MODERATE WBC PRESENT,BOTH PMN AND MONONUCLEAR ABUNDANT GRAM NEGATIVE RODS ABUNDANT GRAM POSITIVE COCCI RARE GRAM VARIABLE ROD    Culture      MODERATE NORMAL SKIN FLORA NO STAPHYLOCOCCUS AUREUS ISOLATED NO GROUP A STREP  (S.PYOGENES) ISOLATED Performed at [facility], [address], [place], [place]    Report Status 07/14/2020 FINAL       PHQ2/9: Depression screen [facility] 2/9 07/29/2020 04/30/2020 04/02/2020 03/05/2020 01/24/2020  Decreased Interest 0 3 1 2  0  Down, Depressed, Hopeless 1 0 1 2 0  PHQ - 2 Score 1 3 2 4  0  Altered sleeping 3 - 1 1 1   Tired, decreased energy 0 - 1 1 1   Change in appetite 3 - 3 3 3   Feeling bad or failure about yourself  0 - 0 0 0  Trouble concentrating 3 - 3 3 3   Moving slowly or fidgety/restless 0 - 0 0 0  Suicidal thoughts 0 - 0 0 0  PHQ-9 Score 10 - 10 12 8   Difficult doing work/chores - - Somewhat difficult  Not difficult at all Not difficult at all  Some recent data might be hidden    phq 9 is positive - difficulty to evaluate since she has dementia    Fall Risk: Fall Risk  07/29/2020 04/30/2020 04/02/2020 03/05/2020 01/24/2020  Falls in the past year? 0 0 1 1 1   Number falls in past yr: 0 0 1 0 0  Injury with Fall? 0 0 1 1 1   Risk for fall due to : - - Impaired balance/gait;Impaired mobility;History of fall(s) - -  Risk for fall due to: Comment - - - - -  Follow up - - Falls prevention discussed - -     Functional Status Survey: Is the patient deaf or have difficulty hearing?: Yes Does the patient have difficulty seeing, even when wearing glasses/contacts?: Yes Does the patient have difficulty concentrating, remembering, or making decisions?: Yes Does the patient have difficulty walking or climbing stairs?: Yes Does the patient have difficulty dressing or bathing?: Yes Does the patient have difficulty doing errands alone such as visiting a doctor's office or shopping?: Yes   Assessment & Plan  1. Pressure injury of skin of sacral region, unspecified injury stage  Keeps follow up with wound center  2. Late onset Alzheimer's disease without behavioral disturbance (HCC)   3. Mixed hyperlipidemia  - Lipid panel  4.Chronic systolic  heart failure (HCC)  - metoprolol succinate (TOPROL XL) 50 MG 24 hr tablet; Take 1 tablet (50 mg total) by mouth daily. Take with or immediately following a meal.  Dispense: 90 tablet; Refill: 1  5. Hypothyroidism, unspecified type  - TSH  6. Essential hypertension  - COMPLETE METABOLIC PANEL WITH GFR - CBC with Differential/Platelet - metoprolol succinate (TOPROL XL) 50 MG 24 hr tablet; Take 1 tablet (50 mg total) by mouth daily. Take with or immediately following a meal.  Dispense: 90 tablet; Refill: 1  7. Senile purpura (HCC)   8. At risk for fall due to comorbid condition   9. Need for pneumococcal vaccine  - Pneumococcal conjugate vaccine 20-valent (Prevnar 20)  10. Hyperglycemia  - Hemoglobin A1c

## 2020-07-30 ENCOUNTER — Encounter: Payer: Medicare Other | Admitting: Physician Assistant

## 2020-07-30 DIAGNOSIS — L89313 Pressure ulcer of right buttock, stage 3: Secondary | ICD-10-CM | POA: Diagnosis not present

## 2020-07-30 LAB — LIPID PANEL
Cholesterol: 196 mg/dL (ref <200)
HDL: 55 mg/dL (ref >=50)
LDL Cholesterol (Calc): 122 mg/dL (calc) — ABNORMAL HIGH
Non-HDL Cholesterol (Calc): 141 mg/dL (calc) — ABNORMAL HIGH (ref <130)
Total CHOL/HDL Ratio: 3.6 (calc) (ref <5.0)
Triglycerides: 93 mg/dL (ref <150)

## 2020-07-30 LAB — COMPLETE METABOLIC PANEL WITH GFR
AG Ratio: 0.9 (calc) — ABNORMAL LOW (ref 1.0–2.5)
ALT: 6 U/L (ref 6–29)
AST: 12 U/L (ref 10–35)
Albumin: 3.3 g/dL — ABNORMAL LOW (ref 3.6–5.1)
Alkaline phosphatase (APISO): 65 U/L (ref 37–153)
BUN: 13 mg/dL (ref 7–25)
CO2: 33 mmol/L — ABNORMAL HIGH (ref 20–32)
Calcium: 9.3 mg/dL (ref 8.6–10.4)
Chloride: 100 mmol/L (ref 98–110)
Creat: 0.61 mg/dL (ref 0.60–0.88)
GFR, Est African American: 93 mL/min/{1.73_m2} (ref >=60)
GFR, Est Non African American: 80 mL/min/{1.73_m2} (ref >=60)
Globulin: 3.7 g/dL (calc) (ref 1.9–3.7)
Glucose, Bld: 112 mg/dL — ABNORMAL HIGH (ref 65–99)
Potassium: 4.2 mmol/L (ref 3.5–5.3)
Sodium: 140 mmol/L (ref 135–146)
Total Bilirubin: 0.7 mg/dL (ref 0.2–1.2)
Total Protein: 7 g/dL (ref 6.1–8.1)

## 2020-07-30 LAB — HEMOGLOBIN A1C
Hgb A1c MFr Bld: 6.1 % of total Hgb — ABNORMAL HIGH (ref <5.7)
Mean Plasma Glucose: 128 mg/dL
eAG (mmol/L): 7.1 mmol/L

## 2020-07-30 LAB — CBC WITH DIFFERENTIAL/PLATELET
Absolute Monocytes: 1009 cells/uL — ABNORMAL HIGH (ref 200–950)
Basophils Absolute: 114 cells/uL (ref 0–200)
Basophils Relative: 1.1 %
Eosinophils Absolute: 926 cells/uL — ABNORMAL HIGH (ref 15–500)
Eosinophils Relative: 8.9 %
HCT: 35.3 % (ref 35.0–45.0)
Hemoglobin: 11.1 g/dL — ABNORMAL LOW (ref 11.7–15.5)
Lymphs Abs: 1882 cells/uL (ref 850–3900)
MCH: 28.5 pg (ref 27.0–33.0)
MCHC: 31.4 g/dL — ABNORMAL LOW (ref 32.0–36.0)
MCV: 90.7 fL (ref 80.0–100.0)
MPV: 9.9 fL (ref 7.5–12.5)
Monocytes Relative: 9.7 %
Neutro Abs: 6469 cells/uL (ref 1500–7800)
Neutrophils Relative %: 62.2 %
Platelets: 320 10*3/uL (ref 140–400)
RBC: 3.89 10*6/uL (ref 3.80–5.10)
RDW: 11.7 % (ref 11.0–15.0)
Total Lymphocyte: 18.1 %
WBC: 10.4 10*3/uL (ref 3.8–10.8)

## 2020-07-30 LAB — TSH: TSH: 5.23 mIU/L — ABNORMAL HIGH (ref 0.40–4.50)

## 2020-07-30 NOTE — Progress Notes (Addendum)
CALEDONIA, ZOU ([id number]) Visit Report for 07/30/2020 Arrival Information Details Patient Name: [name], [name]. Date of Service: 07/30/2020 9:45 AM Medical Record Number: [id number] Patient Account Number: [id number] Date of Birth/Sex: 01-10-1928 (87 y.o. F) Treating RN: Yevonne Pax Primary Care Karee Christopherson: Ellwood Dense Other Clinician: Referring Ailie Gage: Ellwood Dense Treating Nereida Schepp/Extender: Rowan Blase in Treatment: 4 Visit Information History Since Last Visit All ordered tests and consults were completed: No Patient Arrived: Wheel Chair Added or deleted any medications: No Arrival Time: 09:54 Any new allergies or adverse reactions: No Accompanied By: daughter Had a fall or experienced change in No Transfer Assistance: None activities of daily living that may affect Patient Identification Verified: Yes risk of falls: Secondary Verification Process Completed: Yes Signs or symptoms of abuse/neglect since last visito No Patient Has Alerts: Yes Hospitalized since last visit: No Patient Alerts: Eliquis STOPPED 04/16/20 Implantable device outside of the clinic excluding No cellular tissue based products placed in the center since last visit: Has Dressing in Place as Prescribed: Yes Pain Present Now: No Electronic Signature(s) Signed: 07/30/2020 5:43:53 PM By: Yevonne Pax RN Entered By: Yevonne Pax on 07/30/2020 09:54:49 Stclair, Deborah Bonito ([id number]) -------------------------------------------------------------------------------- Clinic Level of Care Assessment Details Patient Name: [name], [name]. Date of Service: 07/30/2020 9:45 AM Medical Record Number: [id number] Patient Account Number: [id number] Date of Birth/Sex: [birth year] (87 y.o. F) Treating RN: Rogers Blocker Primary Care Camaria Gerald: Ellwood Dense Other Clinician: Referring Brittiny Levitz: Ellwood Dense Treating Shimon Trowbridge/Extender: Rowan Blase in Treatment: 4 Clinic Level of  Care Assessment Items TOOL 4 Quantity Score X - Use when only an EandM is performed on FOLLOW-UP visit 1 0 ASSESSMENTS - Nursing Assessment / Reassessment X - Reassessment of Co-morbidities (includes updates in patient status) 1 10 X- 1 5 Reassessment of Adherence to Treatment Plan ASSESSMENTS - Wound and Skin Assessment / Reassessment X - Simple Wound Assessment / Reassessment - one wound 1 5 []  - 0 Complex Wound Assessment / Reassessment - multiple wounds []  - 0 Dermatologic / Skin Assessment (not related to wound area) ASSESSMENTS - Focused Assessment []  - Circumferential Edema Measurements - multi extremities 0 []  - 0 Nutritional Assessment / Counseling / Intervention []  - 0 Lower Extremity Assessment (monofilament, tuning fork, pulses) []  - 0 Peripheral Arterial Disease Assessment (using hand held doppler) ASSESSMENTS - Ostomy and/or Continence Assessment and Care []  - Incontinence Assessment and Management 0 []  - 0 Ostomy Care Assessment and Management (repouching, etc.) PROCESS - Coordination of Care X - Simple Patient / Family Education for ongoing care 1 15 []  - 0 Complex (extensive) Patient / Family Education for ongoing care []  - 0 Staff obtains , Records, Test Results / Process Orders []  - 0 Staff telephones HHA, Nursing Homes / Clarify orders / etc []  - 0 Routine Transfer to another Facility (non-emergent condition) []  - 0 Routine Hospital Admission (non-emergent condition) []  - 0 New Admissions / / Ordering NPWT, Apligraf, etc. []  - 0 Emergency Hospital Admission (emergent condition) X- 1 10 Simple Discharge Coordination []  - 0 Complex (extensive) Discharge Coordination PROCESS - Special Needs []  - Pediatric / Minor Patient Management 0 []  - 0 Isolation Patient Management []  - 0 Hearing / Language / Visual special needs []  - 0 Assessment of Community assistance (transportation, D/C planning, etc.) []  - 0 Additional  assistance / Altered mentation []  - 0 Support Surface(s) Assessment (bed, cushion, seat, etc.) INTERVENTIONS - Wound Cleansing / Measurement [name], Deborah N. ( ) X- 1 5 Simple Wound Cleansing -  one wound []  - 0 Complex Wound Cleansing - multiple wounds X- 1 5 Wound Imaging (photographs - any number of wounds) []  - 0 Wound Tracing (instead of photographs) X- 1 5 Simple Wound Measurement - one wound []  - 0 Complex Wound Measurement - multiple wounds INTERVENTIONS - Wound Dressings []  - Small Wound Dressing one or multiple wounds 0 X- 1 15 Medium Wound Dressing one or multiple wounds []  - 0 Large Wound Dressing one or multiple wounds []  - 0 Application of Medications - topical []  - 0 Application of Medications - injection INTERVENTIONS - Miscellaneous []  - External ear exam 0 []  - 0 Specimen Collection (cultures, biopsies, blood, body fluids, etc.) []  - 0 Specimen(s) / Culture(s) sent or taken to Lab for analysis []  - 0 Patient Transfer (multiple staff / / Similar devices) []  - 0 Simple Staple / Suture removal (25 or less) []  - 0 Complex Staple / Suture removal (26 or more) []  - 0 Hypo / Hyperglycemic Management (close monitor of Blood Glucose) []  - 0 Ankle / Brachial Index (ABI) - do not check if billed separately X- 1 5 Vital Signs Has the patient been seen at the hospital within the last three years: Yes Total Score: 80 Level Of Care: New/Established - Level 3 Electronic Signature(s) Signed: 07/30/2020 3:02:05 PM By: , RN Entered By: , Kenia on 07/30/2020 10:38:44 [name], ( ) -------------------------------------------------------------------------------- Encounter Discharge Information Details Patient Name: Date of Service: 07/30/2020 9:45 AM Medical Record Number: Patient Account Number: Nurse, adult Date of Birth/Sex: 02/23/1928 (87 y.o.  F) Treating RN: Primary Care Delorus Langwell: Other Clinician: Referring Kionte Baumgardner: 08/01/2020 Treating Tung Pustejovsky/Extender: Phillis Haggis in Treatment: 4 Encounter Discharge Information Items Discharge Condition: Stable Ambulatory Status: Wheelchair Discharge Destination: Home Transportation: Private Auto Accompanied By: daughter Schedule Follow-up Appointment: Yes Clinical Summary of Care: Electronic Signature(s) Signed: 07/30/2020 2:43:37 PM By: Phillis Haggis Entered By: 08/01/2020 on 07/30/2020 10:55:15 Crammer, [id number] (Deborah Schlein) -------------------------------------------------------------------------------- Lower Extremity Assessment Details Patient Name: 08/01/2020. Date of Service: 07/30/2020 9:45 AM Medical Record Number: [id number] Patient Account Number: 14/02/1928 Date of Birth/Sex: [birth year] (87 y.o. F) Treating RN: Ellwood Dense Primary Care Kenyia Wambolt: Ellwood Dense Other Clinician: Referring Jareth Pardee: Rowan Blase Treating Jorah Hua/Extender: 08/01/2020 in Treatment: 4 Electronic Signature(s) Signed: 07/30/2020 5:43:53 PM By: Lolita Cram RN Entered By: 08/01/2020 on 07/30/2020 09:57:59 [name], Deborah (Deborah Schlein) -------------------------------------------------------------------------------- Multi Wound Chart Details Patient Name: 08/01/2020. Date of Service: 07/30/2020 9:45 AM Medical Record Number: [id number] Patient Account Number: 14/02/1928 Date of Birth/Sex: [birth year] (87 y.o. F) Treating RN: Ellwood Dense Primary Care Eugene Zeiders: Ellwood Dense Other Clinician: Referring Mazen Marcin: Rowan Blase Treating Fayette Gasner/Extender: 08/01/2020 in Treatment: 4 Vital Signs Height(in): Pulse(bpm): 76 Weight(lbs): 134 Blood Pressure(mmHg): 137/68 Body Mass Index(BMI): Temperature(F): 98.2 Respiratory Rate(breaths/min): 16 Photos: [N/A:N/A] Wound Location: Sacrum Right  Gluteus N/A Wounding Event: Gradually Appeared Gradually Appeared N/A Primary Etiology: Pressure Ulcer Pressure Ulcer N/A Comorbid History: Cataracts, Arrhythmia, Congestive Cataracts, Arrhythmia, Congestive N/A Heart Failure, Hypertension, Heart Failure, Hypertension, Dementia Dementia Date Acquired: 06/07/2020 06/14/2020 N/A Weeks of Treatment: 4 4 N/A Wound Status: Open Healed - Epithelialized N/A Measurements L x W x D (cm) 2.5x1x1.5 0x0x0 N/A Area (cm) : 1.963 0 N/A Volume (cm) : 2.945 0 N/A % Reduction in Area: -4.10% 100.00% N/A % Reduction in Volume: -123.30% 100.00% N/A Starting Position 1 (o'clock): Ending Position 1 (o'clock): Undermining: Yes No N/A  Classification: Category/Stage III Category/Stage III N/A Exudate Amount: Medium None Present N/A Exudate Type: Serosanguineous N/A N/A Exudate Color: red, brown N/A N/A Wound Margin: N/A Flat and Intact N/A Granulation Amount: Large (67-100%) None Present (0%) N/A Granulation Quality: Pink N/A N/A Necrotic Amount: None Present (0%) None Present (0%) N/A Exposed Structures: Fat Layer (Subcutaneous Tissue): Fascia: No N/A Yes Fat Layer (Subcutaneous Tissue): Fascia: No No Tendon: No Tendon: No Muscle: No Muscle: No Joint: No Joint: No Bone: No Bone: No Epithelialization: None Large (67-100%) N/A Treatment Notes Electronic Signature(s) Signed: 07/30/2020 3:02:05 PM By: Phillis HaggisSanchez Pereyda, Dondra PraderKenia RN Entered By: Phillis HaggisSanchez Pereyda, Kenia on 07/30/2020 10:34:59 Shoemaker, Deborah BonitoJERALDINE N. ([id number]) Janczak, Deborah BonitoJERALDINE N. ([id number]) -------------------------------------------------------------------------------- Multi-Disciplinary Care Plan Details Patient Name: [name], Deborah N. Date of Service: 07/30/2020 9:45 AM Medical Record Number: [id number] Patient Account Number: [id number] Date of Birth/Sex: [birth year] (87 y.o. F) Treating RN: Rogers BlockerSanchez, Kenia Primary Care Dimitrius Steedman: Ellwood Denseumball, Alison Other Clinician: Referring  Taylah Dubiel: Ellwood Denseumball, Alison Treating Thaddeus Evitts/Extender: Rowan BlaseStone, Hoyt Weeks in Treatment: 4 Active Inactive Electronic Signature(s) Signed: 08/15/2020 1:45:26 PM By: Elliot GurneyWoody, BSN, RN, CWS, Kim RN, BSN Signed: 09/06/2020 4:30:02 PM By: Lajean ManesSanchez Pereyda, Kenia RN Previous Signature: 07/30/2020 3:02:05 PM Version By: Phillis HaggisSanchez Pereyda, Dondra PraderKenia RN Entered By: Elliot GurneyWoody, BSN, RN, CWS, Kim on 08/15/2020 13:45:26 [place], Deborah BonitoJERALDINE N. ([id number]) -------------------------------------------------------------------------------- Pain Assessment Details Patient Name: [name], Deborah N. Date of Service: 07/30/2020 9:45 AM Medical Record Number: [id number] Patient Account Number: [id number] Date of Birth/Sex: [birth year] (87 y.o. F) Treating RN: Yevonne PaxEpps, Carrie Primary Care Amar Sippel: Ellwood Denseumball, Alison Other Clinician: Referring Trent Theisen: Ellwood Denseumball, Alison Treating Kylee Umana/Extender: Rowan BlaseStone, Hoyt Weeks in Treatment: 4 Active Problems Location of Pain Severity and Description of Pain Patient Has Paino No Site Locations Pain Management and Medication Current Pain Management: Electronic Signature(s) Signed: 07/30/2020 5:43:53 PM By: Yevonne PaxEpps, Carrie RN Entered By: Yevonne PaxEpps, Carrie on 07/30/2020 09:55:18 [name], Deborah BonitoJERALDINE N. ([id number]) -------------------------------------------------------------------------------- Patient/Caregiver Education Details Patient Name: [name], Deborah N. Date of Service: 07/30/2020 9:45 AM Medical Record Number: [id number] Patient Account Number: [id number] Date of Birth/Gender: [birth year] (87 y.o. F) Treating RN: Rogers BlockerSanchez, Kenia Primary Care Physician: Ellwood Denseumball, Alison Other Clinician: Referring Physician: Ellwood Denseumball, Alison Treating Physician/Extender: Rowan BlaseStone, Hoyt Weeks in Treatment: 4 Education Assessment Education Provided To: Patient Education Topics Provided Wound/Skin Impairment: Methods: Explain/Verbal Responses: State content correctly Electronic Signature(s) Signed: 07/30/2020 3:02:05  PM By: Phillis HaggisSanchez Pereyda, Dondra PraderKenia RN Entered By: Phillis HaggisSanchez Pereyda, Kenia on 07/30/2020 10:39:07 [name], Deborah BonitoJERALDINE N. ([id number]) -------------------------------------------------------------------------------- Wound Assessment Details Patient Name: [name], Malyiah N. Date of Service: 07/30/2020 9:45 AM Medical Record Number: [id number] Patient Account Number: [id number] Date of Birth/Sex: [birth year] (87 y.o. F) Treating RN: Yevonne PaxEpps, Carrie Primary Care Carmell Elgin: Ellwood Denseumball, Alison Other Clinician: Referring Delano Scardino: Ellwood Denseumball, Alison Treating Coreena Rubalcava/Extender: Rowan BlaseStone, Hoyt Weeks in Treatment: 4 Wound Status Wound Number: 4 Primary Pressure Ulcer Etiology: Wound Location: Sacrum Wound Status: Open Wounding Event: Gradually Appeared Comorbid Cataracts, Arrhythmia, Congestive Heart Failure, Date Acquired: 06/07/2020 History: Hypertension, Dementia Weeks Of Treatment: 4 Clustered Wound: No Photos Wound Measurements Length: (cm) 2.5 Width: (cm) 1 Depth: (cm) 1.5 Area: (cm) 1.963 Volume: (cm) 2.945 % Reduction in Area: -4.1% % Reduction in Volume: -123.3% Epithelialization: None Tunneling: No Undermining: Yes Wound Description Classification: Category/Stage III Exudate Amount: Medium Exudate Type: Serosanguineous Exudate Color: red, brown Foul Odor After Cleansing: No Slough/Fibrino No Wound Bed Granulation Amount: Large (67-100%) Exposed Structure Granulation Quality: Pink Fascia Exposed: No Necrotic Amount: None Present (0%) Fat Layer (Subcutaneous Tissue) Exposed: Yes Tendon Exposed: No Muscle Exposed: No Joint Exposed: No Bone Exposed: No Electronic  Signature(s) Signed: 07/30/2020 5:43:53 PM By: Yevonne Pax RN Entered By: Yevonne Pax on 07/30/2020 09:57:40 Vanderberg, Deborah Bonito ([id number]) -------------------------------------------------------------------------------- Wound Assessment Details Patient Name: MARINELL, IGARASHI. Date of Service: 07/30/2020 9:45  AM Medical Record Number: [id number] Patient Account Number: [id number] Date of Birth/Sex: [birth year] (87 y.o. F) Treating RN: Yevonne Pax Primary Care Germani Gavilanes: Ellwood Dense Other Clinician: Referring Montrel Donahoe: Ellwood Dense Treating Jabree Rebert/Extender: Rowan Blase in Treatment: 4 Wound Status Wound Number: 5 Primary Pressure Ulcer Etiology: Wound Location: Right Gluteus Wound Status: Healed - Epithelialized Wounding Event: Gradually Appeared Comorbid Cataracts, Arrhythmia, Congestive Heart Failure, Date Acquired: 06/14/2020 History: Hypertension, Dementia Weeks Of Treatment: 4 Clustered Wound: No Photos Wound Measurements Length: (cm) 0 Width: (cm) 0 Depth: (cm) 0 Area: (cm) 0 Volume: (cm) 0 % Reduction in Area: 100% % Reduction in Volume: 100% Epithelialization: Large (67-100%) Tunneling: No Undermining: No Wound Description Classification: Category/Stage III Wound Margin: Flat and Intact Exudate Amount: None Present Foul Odor After Cleansing: No Slough/Fibrino No Wound Bed Granulation Amount: None Present (0%) Exposed Structure Necrotic Amount: None Present (0%) Fascia Exposed: No Fat Layer (Subcutaneous Tissue) Exposed: No Tendon Exposed: No Muscle Exposed: No Joint Exposed: No Bone Exposed: No Electronic Signature(s) Signed: 07/30/2020 3:02:05 PM By: Lajean Manes RN Signed: 07/30/2020 5:43:53 PM By: Yevonne Pax RN Entered By: Phillis Haggis, Dondra Prader on 07/30/2020 10:33:50 Hassan, Deborah Bonito ([id number]) -------------------------------------------------------------------------------- Vitals Details Patient Name: Deborah Schlein Date of Service: 07/30/2020 9:45 AM Medical Record Number: [id number] Patient Account Number: [id number] Date of Birth/Sex: 1927/04/26 (87 y.o. F) Treating RN: Yevonne Pax Primary Care Lindaann Gradilla: Ellwood Dense Other Clinician: Referring Kriste Broman: Ellwood Dense Treating Raedyn Klinck/Extender: Rowan Blase in Treatment: 4 Vital Signs Time Taken: 09:54 Temperature (F): 98.2 Weight (lbs): 134 Pulse (bpm): 76 Respiratory Rate (breaths/min): 16 Blood Pressure (mmHg): 137/68 Reference Range: 80 - 120 mg / dl Electronic Signature(s) Signed: 07/30/2020 5:43:53 PM By: Yevonne Pax RN Entered By: Yevonne Pax on 07/30/2020 09:55:11

## 2020-07-30 NOTE — Progress Notes (Addendum)
YANIRA, TOLSMA ([id number]) Visit Report for 07/30/2020 Chief Complaint Document Details Patient Name: [name], [name]. Date of Service: 07/30/2020 9:45 AM Medical Record Number: [id number] Patient Account Number: [id number] Date of Birth/Sex: [birth year] (87 y.o. F) Treating RN: Dolan Amen Primary Care Provider: Rory Percy Other Clinician: Referring Provider: Rory Percy Treating Provider/Extender: Skipper Cliche in Treatment: 4 Information Obtained from: Patient Chief Complaint Gluteal and right ear ulcers Electronic Signature(s) Signed: 07/30/2020 9:59:40 AM By: Worthy Keeler PA-C Entered By: Worthy Keeler on 07/30/2020 09:59:40 [name], [name] ([id number]) -------------------------------------------------------------------------------- HPI Details Patient Name: [name] Date of Service: 07/30/2020 9:45 AM Medical Record Number: [id number] Patient Account Number: [id number] Date of Birth/Sex: [birth year] (87 y.o. F) Treating RN: Dolan Amen Primary Care Provider: Rory Percy Other Clinician: Referring Provider: Rory Percy Treating Provider/Extender: Skipper Cliche in Treatment: 4 History of Present Illness HPI Description: 05/02/2020 upon evaluation today patient appears to be doing somewhat poorly in regard to her gluteal region. She has moisture associated skin breakdown in this area there is a lot of irritation. With that being said unfortunately pretty much anything that is put on this area causes it to become irritated and the patient will apparently get her hands back there and pull off any dressings and mess with the wound scratching a lot. With that being said I think this is the biggest issue at this point. There does not appear to be any signs of active infection which is good and again I do not see any signs of deep tissue injury. She has a small area on the posterior aspect of her auricle but again I think that  is pretty much healed based on what I am seeing today. Patient does have dementia unfortunately she has congestive heart failure and all this began when she broke her hip and was in rehab in September 2021. 07/02/2020 patient appears to be doing poorly currently in regard to issues that she is having with continued moisture associated breakdown. There also appears to be some pressure related issues going on at this point as well. I previously saw her May 02, 2020. Unfortunately since that time her daughter tells me she has continued to sit quite frequently and in fact that is a big issue here as far as offloading is concerned she is not getting up and moving around as frequently as we would like to see. With that being said that has led to pressure related breakdown of the skin unfortunately. She does have altered mental status this means that unfortunately secondary to the dementia she is really not perceiving what she needs to do for herself as far as being cautious and careful is concerned and allowing this to heal appropriately. Again this is the big struggle I think although her family seems to be doing a good job trying to change the dressings and trying to keep things under control here. 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 The latter is doing well. Unfortunately she comes in today with the wound over the sacrum having gross purulent drainage. I have obtained a culture here. She does not appear to be systemically unwell but her daughter reports she simply will not leave the dressing in place and by looks of think she is scratching at the area. She has vascular dementia. 07/16/2020 upon evaluation today patient appears to be doing well with regard to her wounds. In  fact I feel like she is doing much better than she has last week. Fortunately there is no signs of active infection at this time. Her culture was  negative and showed only normal skin flora but the Augmentin does seem to have helped. Well5/12/2020 upon evaluation today patient appears to be doing currently in regard to her wounds. She is showing signs of improvement which is great news and overall very pleased with where things stand today. No fevers, chills, nausea, vomiting, or diarrhea. Overall I think that she is making good progress. She still continues to mess with the dressings her daughter tells me. Obviously this is not good as I would really like to consider the possibility of a wound VAC but again I am not sure that she would leave this alone in order for her to be able to function appropriately to be honest. That I think would be the biggest concern here. In lieu of that I think is just going to take time for this to really heal through basically granulation in slowly but surely. 07/30/2020 upon evaluation today patient appears to be doing really about the same in regard to her wound. I am really not impressed with the alginate and how this is doing I feel like it staying a little bit too moist. I really think she do better even potentially with saline moistened gauze packing as opposed to that. Obviously we stop the Dakin's as it was causing burning and really a lot of discomfort for the patient which definitely we do not want. So for that reason I think the ideal thing would probably be to switch over to the saline moistened gauze packing just damp not wet whatsoever to try to help keep the area clean and act as a wick to clear out any fluid and drainage. Electronic Signature(s) Signed: 07/30/2020 10:43:02 AM By: Worthy Keeler PA-C Entered By: Worthy Keeler on 07/30/2020 10:43:02 [name], [name] ([id number]) -------------------------------------------------------------------------------- Physical Exam Details Patient Name: [name], [name]. Date of Service: 07/30/2020 9:45 AM Medical Record Number:  [id number] Patient Account Number: [id number] Date of Birth/Sex: November 11, 1927 (87 y.o. F) Treating RN: Dolan Amen Primary Care Provider: Rory Percy Other Clinician: Referring Provider: Rory Percy Treating Provider/Extender: Skipper Cliche in Treatment: 4 Constitutional Thin and well-hydrated in no acute distress. Respiratory normal breathing without difficulty. Psychiatric Patient is not able to cooperate in decision making regarding care. Patient has dementia. patient is confused. Notes Upon inspection patient's wound bed actually showed signs of fairly good granulation epithelization at this point. There was some minimal slough buildup noted but again this was cleaned out carefully with saline and gauze. Fortunately there does not appear to be any signs of infection systemically which is great news. Electronic Signature(s) Signed: 07/30/2020 10:43:28 AM By: Worthy Keeler PA-C Entered By: Worthy Keeler on 07/30/2020 10:43:28 [name], [name] ([id number]) -------------------------------------------------------------------------------- Physician Orders Details Patient Name: [name] Date of Service: 07/30/2020 9:45 AM Medical Record Number: [id number] Patient Account Number: [id number] Date of Birth/Sex: 1927/12/13 (87 y.o. F) Treating RN: Dolan Amen Primary Care Provider: Rory Percy Other Clinician: Referring Provider: Rory Percy Treating Provider/Extender: Skipper Cliche in Treatment: 4 Verbal / Phone Orders: No Diagnosis Coding ICD-10 Coding Code Description L89.153 Pressure ulcer of sacral region, stage 3 L89.313 Pressure ulcer of right buttock, stage 3 M62.81 Muscle weakness (generalized) F01.50 Vascular dementia without behavioral disturbance Z86.718 Personal history of other venous thrombosis and embolism L03.818 Cellulitis of other sites Follow-up Appointments o  Return Appointment in 2 weeks. Bathing/ Shower/  Hygiene o Clean wound with Normal Saline or wound cleanser. Additional Orders / Instructions o Follow Nutritious Diet and Increase Protein Intake Medications-Please add to medication list. o P.O. Antibiotics - complete antibiotics Wound Treatment Wound #4 - Sacrum Cleanser: [employer]ram Ancillary Kit - 15 Day Supply (DME) (Generic) 3 x Per Week/30 Days Discharge Instructions: Use supplies as instructed; Kit contains: (15) Saline Bullets; (15) 3x3 Gauze; 15 pr Gloves Cleanser: Normal Saline 3 x Per Week/30 Days Discharge Instructions: Wash your hands with soap and water. Remove old dressing, discard into plastic bag and place into trash. Cleanse the wound with Normal Saline prior to applying a clean dressing using gauze sponges, not tissues or cotton balls. Do not scrub or use excessive force. Pat dry using gauze sponges, not tissue or cotton balls. Primary Dressing: Gauze (DME) (Generic) 3 x Per Week/30 Days Discharge Instructions: Moistened gauze with saline, squeeze out all excess saline, fill space with gauze Secondary Dressing: Mepilex Border Flex, 4x4 (in/in) (Generic) 3 x Per Week/30 Days Discharge Instructions: Apply to wound as directed. Do not cut. Electronic Signature(s) Signed: 07/30/2020 3:02:05 PM By: Georges Mouse, Minus Breeding RN Signed: 07/30/2020 3:13:06 PM By: Worthy Keeler PA-C Entered By: Georges Mouse, Minus Breeding on 07/30/2020 11:01:02 [name], [name] ([id number]) -------------------------------------------------------------------------------- Problem List Details Patient Name: ASHIAH, KARPOWICZ Date of Service: 07/30/2020 9:45 AM Medical Record Number: [id number] Patient Account Number: [id number] Date of Birth/Sex: October 22, 1927 (87 y.o. F) Treating RN: Dolan Amen Primary Care Provider: Rory Percy Other Clinician: Referring Provider: Rory Percy Treating Provider/Extender: Skipper Cliche in Treatment: 4 Active Problems ICD-10 Encounter Code  Description Active Date MDM Diagnosis L89.153 Pressure ulcer of sacral region, stage 3 07/02/2020 No Yes L89.313 Pressure ulcer of right buttock, stage 3 07/02/2020 No Yes M62.81 Muscle weakness (generalized) 07/02/2020 No Yes F01.50 Vascular dementia without behavioral disturbance 07/02/2020 No Yes Z86.718 Personal history of other venous thrombosis and embolism 07/02/2020 No Yes L03.818 Cellulitis of other sites 07/11/2020 No Yes Inactive Problems ICD-10 Code Description Active Date Inactive Date I10 Essential (primary) hypertension 07/02/2020 07/02/2020 Resolved Problems Electronic Signature(s) Signed: 07/30/2020 9:59:33 AM By: Worthy Keeler PA-C Entered By: Worthy Keeler on 07/30/2020 09:59:33 [name], [name] ([id number]) -------------------------------------------------------------------------------- Progress Note Details Patient Name: [name] Date of Service: 07/30/2020 9:45 AM Medical Record Number: [id number] Patient Account Number: [id number] Date of Birth/Sex: 03-23-1927 (87 y.o. F) Treating RN: Dolan Amen Primary Care Provider: Rory Percy Other Clinician: Referring Provider: Rory Percy Treating Provider/Extender: Skipper Cliche in Treatment: 4 Subjective Chief Complaint Information obtained from Patient Gluteal and right ear ulcers History of Present Illness (HPI) 05/02/2020 upon evaluation today patient appears to be doing somewhat poorly in regard to her gluteal region. She has moisture associated skin breakdown in this area there is a lot of irritation. With that being said unfortunately pretty much anything that is put on this area causes it to become irritated and the patient will apparently get her hands back there and pull off any dressings and mess with the wound scratching a lot. With that being said I think this is the biggest issue at this point. There does not appear to be any signs of active infection which is good and again I  do not see any signs of deep tissue injury. She has a small area on the posterior aspect of her auricle but again I think that is pretty much healed based on what I am seeing today. Patient  does have dementia unfortunately she has congestive heart failure and all this began when she broke her hip and was in rehab in September 2021. 07/02/2020 patient appears to be doing poorly currently in regard to issues that she is having with continued moisture associated breakdown. There also appears to be some pressure related issues going on at this point as well. I previously saw her May 02, 2020. Unfortunately since that time her daughter tells me she has continued to sit quite frequently and in fact that is a big issue here as far as offloading is concerned she is not getting up and moving around as frequently as we would like to see. With that being said that has led to pressure related breakdown of the skin unfortunately. She does have altered mental status this means that unfortunately secondary to the dementia she is really not perceiving what she needs to do for herself as far as being cautious and careful is concerned and allowing this to heal appropriately. Again this is the big struggle I think although her family seems to be doing a good job trying to change the dressings and trying to keep things under control here. 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 The latter is doing well. Unfortunately she comes in today with the wound over the sacrum having gross purulent drainage. I have obtained a culture here. She does not appear to be systemically unwell but her daughter reports she simply will not leave the dressing in place and by looks of think she is scratching at the area. She has vascular dementia. 07/16/2020 upon evaluation today patient appears to be doing well with regard to her wounds. In fact I feel like she is  doing much better than she has last week. Fortunately there is no signs of active infection at this time. Her culture was negative and showed only normal skin flora but the Augmentin does seem to have helped. Well5/12/2020 upon evaluation today patient appears to be doing currently in regard to her wounds. She is showing signs of improvement which is great news and overall very pleased with where things stand today. No fevers, chills, nausea, vomiting, or diarrhea. Overall I think that she is making good progress. She still continues to mess with the dressings her daughter tells me. Obviously this is not good as I would really like to consider the possibility of a wound VAC but again I am not sure that she would leave this alone in order for her to be able to function appropriately to be honest. That I think would be the biggest concern here. In lieu of that I think is just going to take time for this to really heal through basically granulation in slowly but surely. 07/30/2020 upon evaluation today patient appears to be doing really about the same in regard to her wound. I am really not impressed with the alginate and how this is doing I feel like it staying a little bit too moist. I really think she do better even potentially with saline moistened gauze packing as opposed to that. Obviously we stop the Dakin's as it was causing burning and really a lot of discomfort for the patient which definitely we do not want. So for that reason I think the ideal thing would probably be to switch over to the saline moistened gauze packing just damp not wet whatsoever to try to help keep the  area clean and act as a wick to clear out any fluid and drainage. Objective Constitutional Thin and well-hydrated in no acute distress. Vitals Time Taken: 9:54 AM, Weight: 134 lbs, Temperature: 98.2 F, Pulse: 76 bpm, Respiratory Rate: 16 breaths/min, Blood Pressure: 137/68 mmHg. Respiratory normal breathing without  difficulty. Psychiatric [name], [name] ([id number]) Patient is not able to cooperate in decision making regarding care. Patient has dementia. patient is confused. General Notes: Upon inspection patient's wound bed actually showed signs of fairly good granulation epithelization at this point. There was some minimal slough buildup noted but again this was cleaned out carefully with saline and gauze. Fortunately there does not appear to be any signs of infection systemically which is great news. Integumentary (Hair, Skin) Wound #4 status is Open. Original cause of wound was Gradually Appeared. The date acquired was: 06/07/2020. The wound has been in treatment 4 weeks. The wound is located on the Sacrum. The wound measures 2.5cm length x 1cm width x 1.5cm depth; 1.963cm^2 area and 2.945cm^3 volume. There is Fat Layer (Subcutaneous Tissue) exposed. There is no tunneling noted, however, there is undermining starting at :00 and ending at :00. There is a medium amount of serosanguineous drainage noted. There is large (67-100%) pink granulation within the wound bed. There is no necrotic tissue within the wound bed. Wound #5 status is Healed - Epithelialized. Original cause of wound was Gradually Appeared. The date acquired was: 06/14/2020. The wound has been in treatment 4 weeks. The wound is located on the Right Gluteus. The wound measures 0cm length x 0cm width x 0cm depth; 0cm^2 area and 0cm^3 volume. There is no tunneling or undermining noted. There is a none present amount of drainage noted. The wound margin is flat and intact. There is no granulation within the wound bed. There is no necrotic tissue within the wound bed. Assessment Active Problems ICD-10 Pressure ulcer of sacral region, stage 3 Pressure ulcer of right buttock, stage 3 Muscle weakness (generalized) Vascular dementia without behavioral disturbance Personal history of other venous thrombosis and embolism Cellulitis of other  sites Plan Follow-up Appointments: Return Appointment in 2 weeks. Bathing/ Shower/ Hygiene: Clean wound with Normal Saline or wound cleanser. Additional Orders / Instructions: Follow Nutritious Diet and Increase Protein Intake Medications-Please add to medication list.: P.O. Antibiotics - complete antibiotics WOUND #4: - Sacrum Wound Laterality: Cleanser: Normal Saline 3 x Per Week/30 Days Discharge Instructions: Wash your hands with soap and water. Remove old dressing, discard into plastic bag and place into trash. Cleanse the wound with Normal Saline prior to applying a clean dressing using gauze sponges, not tissues or cotton balls. Do not scrub or use excessive force. Pat dry using gauze sponges, not tissue or cotton balls. Primary Dressing: Gauze 3 x Per Week/30 Days Discharge Instructions: Moistened gauze with saline, squeeze out all excess saline, fill space with gauze Secondary Dressing: Mepilex Border Flex, 4x4 (in/in) (Generic) 3 x Per Week/30 Days Discharge Instructions: Apply to wound as directed. Do not cut. 1. Would recommend currently that we go ahead and continue with the wound care measures as before and the patient is in agreement the plan. This includes the use of the appropriate offloading which should be done at regular intervals her daughter takes great care of her. 2. I am also can recommend that we use saline moistened gauze dressing to pack into the wound. This should be done lightly. I discussed that with the patient and her daughter today. Obviously the daughter is the primary  caregiver and Media planner. This needs to be just damp not extremely wet at all. 3. I would recommend a border foam dressing to cover which seems to be doing decently well as well. Were not using the Dakin's solution as this previously caused a lot of burning and discomfort unfortunately. We will see patient back for reevaluation in 2 weeks here in the clinic. If anything worsens or  changes patient will contact our office for additional recommendations. Electronic Signature(s) [name], [name] ([id number]) Signed: 07/30/2020 10:44:31 AM By: Worthy Keeler PA-C Entered By: Worthy Keeler on 07/30/2020 10:44:31 [name], [name] ([id number]) -------------------------------------------------------------------------------- SuperBill Details Patient Name: [name] Date of Service: 07/30/2020 Medical Record Number: [id number] Patient Account Number: [id number] Date of Birth/Sex: [birth year] (87 y.o. F) Treating RN: Dolan Amen Primary Care Provider: Rory Percy Other Clinician: Referring Provider: Rory Percy Treating Provider/Extender: Skipper Cliche in Treatment: 4 Diagnosis Coding ICD-10 Codes Code Description [phone] Pressure ulcer of sacral region, stage 3 L89.313 Pressure ulcer of right buttock, stage 3 M62.81 Muscle weakness (generalized) F01.50 Vascular dementia without behavioral disturbance Z86.718 Personal history of other venous thrombosis and embolism L03.818 Cellulitis of other sites Facility Procedures CPT4 Code: [id number] Description: 99213 - WOUND CARE VISIT-LEV 3 EST PT Modifier: Quantity: 1 Physician Procedures CPT4 Code: [id number] Description: 64847 - WC PHYS LEVEL 4 - EST PT Modifier: Quantity: 1 CPT4 Code: Description: ICD-10 Diagnosis Description L89.153 Pressure ulcer of sacral region, stage 3 L89.313 Pressure ulcer of right buttock, stage 3 M62.81 Muscle weakness (generalized) F01.50 Vascular dementia without behavioral disturbance Modifier: Quantity: Electronic Signature(s) Signed: 07/30/2020 10:44:45 AM By: Worthy Keeler PA-C Entered By: Worthy Keeler on 07/30/2020 10:44:45

## 2020-07-31 ENCOUNTER — Ambulatory Visit (INDEPENDENT_AMBULATORY_CARE_PROVIDER_SITE_OTHER): Payer: Medicare Other | Admitting: Nurse Practitioner

## 2020-07-31 ENCOUNTER — Encounter (INDEPENDENT_AMBULATORY_CARE_PROVIDER_SITE_OTHER): Payer: Self-pay | Admitting: Nurse Practitioner

## 2020-07-31 ENCOUNTER — Other Ambulatory Visit: Payer: Self-pay

## 2020-07-31 VITALS — BP 146/63 | HR 70 | Resp 15

## 2020-07-31 DIAGNOSIS — E782 Mixed hyperlipidemia: Secondary | ICD-10-CM | POA: Diagnosis not present

## 2020-07-31 DIAGNOSIS — I82402 Acute embolism and thrombosis of unspecified deep veins of left lower extremity: Secondary | ICD-10-CM

## 2020-07-31 DIAGNOSIS — I10 Essential (primary) hypertension: Secondary | ICD-10-CM | POA: Diagnosis not present

## 2020-08-04 ENCOUNTER — Encounter (INDEPENDENT_AMBULATORY_CARE_PROVIDER_SITE_OTHER): Payer: Self-pay | Admitting: Nurse Practitioner

## 2020-08-04 NOTE — Progress Notes (Signed)
Subjective:    Patient ID: [name], female    DOB: 1927-10-07, 87 y.o.   MRN: [id number] Chief Complaint  Patient presents with  . Follow-up    6 month follow up    [name] is a 87 year old female that presents today for follow-up evaluation of her left lower extremity edema following a DVT.  The patient has dementia and is not able to provide any history however her daughter is present and provides all of her history.  The patient's daughter notes that she does have swelling of her left lower extremity but it is variable and does decrease with elevation.  Difficulty is in getting the patient to engage in conservative therapy such as elevating her lower extremity or wearing compression socks.  She hopes to obtain a hospital bed soon that will help with elevation.  The patient does walk but not extensively.  Although the patient does have swelling it does not seem to bother her she is not complaining of pain.  The patient's daughter notes that there is no redness ulcerations or weeping.  Therefore despite the swelling she is doing well.  There has been no fever or chills   Review of Systems  Cardiovascular: Positive for leg swelling.  Neurological: Positive for weakness.  Psychiatric/Behavioral: Positive for confusion.  All other systems reviewed and are negative.      Objective:   Physical Exam Vitals reviewed.  HENT:     Head: Normocephalic.  Cardiovascular:     Rate and Rhythm: Normal rate.     Pulses: Normal pulses.  Pulmonary:     Effort: Pulmonary effort is normal.  Musculoskeletal:     Left lower leg: 2+ Edema present.  Neurological:     Mental Status: She is alert. Mental status is at baseline.     Motor: Weakness present.     Gait: Gait abnormal.  Psychiatric:        Mood and Affect: Mood normal.        Behavior: Behavior is cooperative.        Thought Content: Thought content normal.        Cognition and Memory: Cognition is impaired. Memory  is impaired.     BP (!) 146/63 (BP Location: Right Arm)   Pulse 70   Resp 15   Past Medical History:  Diagnosis Date  . Dementia (HCC)   . Glaucoma   . History of blood clots   . HTN (hypertension) 02/10/2017  . Osteoporosis   . Thyroid disease     Social History   Socioeconomic History  . Marital status: Widowed    Spouse name: Not on file  . Number of children: 6  . Years of education: Not on file  . Highest education level: 10th grade  Occupational History    Employer: RETIRED  Tobacco Use  . Smoking status: Never Smoker  . Smokeless tobacco: Never Used  . Tobacco comment: smoking cessation materials not required  Vaping Use  . Vaping Use: Never used  Substance and Sexual Activity  . Alcohol use: No    Alcohol/week: 0.0 standard drinks  . Drug use: No  . Sexual activity: Not Currently  Other Topics Concern  . Not on file  Social History Narrative   Pt's daughter Kathie Rhodes lives with her   Social Determinants of Health   Financial Resource Strain: Low Risk   . Difficulty of Paying Living Expenses: Not hard at all  Food Insecurity: No Food Insecurity  .  Worried About Programme researcher, broadcasting/film/video in the Last Year: Never true  . Ran Out of Food in the Last Year: Never true  Transportation Needs: No Transportation Needs  . Lack of Transportation (Medical): No  . Lack of Transportation (Non-Medical): No  Physical Activity: Inactive  . Days of Exercise per Week: 0 days  . Minutes of Exercise per Session: 0 min  Stress: No Stress Concern Present  . Feeling of Stress : Not at all  Social Connections: Unknown  . Frequency of Communication with Friends and Family: Patient refused  . Frequency of Social Gatherings with Friends and Family: Patient refused  . Attends Religious Services: Patient refused  . Active Member of Clubs or Organizations: Patient refused  . Attends Banker Meetings: Patient refused  . Marital Status: Widowed  Intimate Partner Violence:  Not At Risk  . Fear of Current or Ex-Partner: No  . Emotionally Abused: No  . Physically Abused: No  . Sexually Abused: No    Past Surgical History:  Procedure Laterality Date  . INTRAMEDULLARY (IM) NAIL INTERTROCHANTERIC Right 12/13/2019   Procedure: INTRAMEDULLARY (IM) NAIL INTERTROCHANTRIC;  Surgeon: Christena Flake, MD;  Location: [facility] ORS;  Service: Orthopedics;  Laterality: Right;    Family History  Problem Relation Age of Onset  . Cancer Father   . Hyperlipidemia Sister   . Heart disease Brother   . Stroke Neg Hx     No Known Allergies  CBC Latest Ref Rng & Units 07/29/2020 01/22/2020 01/21/2020  WBC 3.8 - 10.8 Thousand/uL 10.4 7.0 6.3  Hemoglobin 11.7 - 15.5 g/dL 11.1(L) 10.5(L) 10.0(L)  Hematocrit 35.0 - 45.0 % 35.3 32.7(L) 30.6(L)  Platelets 140 - 400 Thousand/uL 320 285 264      CMP     Component Value Date/Time   NA 140 07/29/2020 1535   NA 143 12/23/2011 1525   K 4.2 07/29/2020 1535   K 4.2 12/23/2011 1525   CL 100 07/29/2020 1535   CL 107 12/23/2011 1525   CO2 33 (H) 07/29/2020 1535   CO2 30 12/23/2011 1525   GLUCOSE 112 (H) 07/29/2020 1535   GLUCOSE 129 (H) 12/23/2011 1525   BUN 13 07/29/2020 1535   BUN 10 12/23/2011 1525   CREATININE 0.61 07/29/2020 1535   CALCIUM 9.3 07/29/2020 1535   CALCIUM 9.5 12/23/2011 1525   PROT 7.0 07/29/2020 1535   PROT 7.4 12/23/2011 1525   ALBUMIN 3.4 (L) 01/19/2020 1607   ALBUMIN 3.8 12/23/2011 1525   AST 12 07/29/2020 1535   AST 21 12/23/2011 1525   ALT 6 07/29/2020 1535   ALT 17 12/23/2011 1525   ALKPHOS 102 01/19/2020 1607   ALKPHOS 57 12/23/2011 1525   BILITOT 0.7 07/29/2020 1535   BILITOT 0.5 12/23/2011 1525   GFRNONAA 80 07/29/2020 1535   GFRAA 93 07/29/2020 1535     No results found.     Assessment & Plan:   1. Deep vein thrombosis (DVT) of left lower extremity, unspecified chronicity, unspecified vein (HCC) Currently the patient does have some swelling but her dementia makes it difficult to engage  in the necessary conservative therapies.  The patient's daughter is advised to try to utilize Ace wraps for compression as well as to try to elevate her lower extremities is much as possible.  We also discussed that the swelling is generally not dangerous and self.  Because of this the patient's daughter will contact our office if she begins to have ulcerations or weeping just general  discomfort of her lower extremities.  Otherwise we will see her on an as-needed basis.  2. Primary hypertension Continue antihypertensive medications as already ordered, these medications have been reviewed and there are no changes at this time.   3. Mixed hyperlipidemia Continue statin as ordered and reviewed, no changes at this time    Current Outpatient Medications on File Prior to Visit  Medication Sig Dispense Refill  . acetaminophen (TYLENOL) 325 MG tablet Take 1-2 tablets (325-650 mg total) by mouth every 6 (six) hours as needed for mild pain (pain score 1-3 or temp > 100.5).    . Calcium 600-200 MG-UNIT tablet Take 1 tablet by mouth daily.    . cholecalciferol (VITAMIN D3) 25 MCG (1000 UNIT) tablet Take 1,000 Units by mouth daily.    . furosemide (LASIX) 20 MG tablet Take 1 tablet (20 mg total) by mouth daily. 90 tablet 1  . levothyroxine (EUTHYROX) 75 MCG tablet Take 1 tablet (75 mcg total) by mouth daily before breakfast. 30 tablet 0  . memantine (NAMENDA) 10 MG tablet Take 1 tablet (10 mg total) by mouth 2 (two) times daily. 180 tablet 1  . metoprolol succinate (TOPROL XL) 50 MG 24 hr tablet Take 1 tablet (50 mg total) by mouth daily. Take with or immediately following a meal. 90 tablet 1   No current facility-administered medications on file prior to visit.    There are no Patient Instructions on file for this visit. No follow-ups on file.   Georgiana Spinner, NP

## 2020-08-06 ENCOUNTER — Ambulatory Visit: Payer: Medicare Other | Admitting: Internal Medicine

## 2020-08-08 ENCOUNTER — Ambulatory Visit: Payer: Self-pay

## 2020-08-08 NOTE — Telephone Encounter (Signed)
Pt.'s daughter, Kathie Rhodes, states pt. Has vomited x 3 since last night. Afterwards was able to keep down coffee and toast. No complaints of any pain. No diarrhea or fever. Reports she has lost 2 more pounds.Wants to know if she should monitor this or does pt. Need to be seen. Also, would like to go ahead and have Hospice services started. Please advise. Answer Assessment - Initial Assessment Questions 1. VOMITING SEVERITY: "How many times have you vomited in the past 24 hours?"     - MILD:  1 - 2 times/day    - MODERATE: 3 - 5 times/day, decreased oral intake without significant weight loss or symptoms of dehydration    - SEVERE: 6 or more times/day, vomits everything or nearly everything, with significant weight loss, symptoms of dehydration      3 2. ONSET: "When did the vomiting begin?"      Last night 3. FLUIDS: "What fluids or food have you vomited up today?" "Have you been able to keep any fluids down?"     Keeping fluids down 4. ABDOMINAL PAIN: "Are your having any abdominal pain?" If yes : "How bad is it and what does it feel like?" (e.g., crampy, dull, intermittent, constant)      No 5. DIARRHEA: "Is there any diarrhea?" If Yes, ask: "How many times today?"      No 6. CONTACTS: "Is there anyone else in the family with the same symptoms?"      No 7. CAUSE: "What do you think is causing your vomiting?"     Unsure 8. HYDRATION STATUS: "Any signs of dehydration?" (e.g., dry mouth [not only dry lips], too weak to stand) "When did you last urinate?"     No 9. OTHER SYMPTOMS: "Do you have any other symptoms?" (e.g., fever, headache, vertigo, vomiting blood or coffee grounds, recent head injury)     No 10. PREGNANCY: "Is there any chance you are pregnant?" "When was your last menstrual period?"       No  Protocols used: [facility]

## 2020-08-08 NOTE — Telephone Encounter (Signed)
Left detailed vm daughter Kathie Rhodes, That there is stomach virus going around.  I told her to keep her well hydrated with fluids like Gatorade pedia lite and to eat bland food.  If she continues to vomit due to age and weight she will need to go to ER.

## 2020-08-13 ENCOUNTER — Ambulatory Visit: Payer: Medicare Other | Admitting: Physician Assistant

## 2020-08-14 ENCOUNTER — Encounter: Payer: Self-pay | Admitting: Family Medicine

## 2020-08-14 ENCOUNTER — Other Ambulatory Visit: Payer: Self-pay | Admitting: Family Medicine

## 2020-08-14 DIAGNOSIS — F0280 Dementia in other diseases classified elsewhere without behavioral disturbance: Secondary | ICD-10-CM

## 2020-08-14 DIAGNOSIS — G301 Alzheimer's disease with late onset: Secondary | ICD-10-CM

## 2020-08-14 DIAGNOSIS — I5022 Chronic systolic (congestive) heart failure: Secondary | ICD-10-CM

## 2020-08-19 ENCOUNTER — Other Ambulatory Visit: Payer: Self-pay | Admitting: Family Medicine

## 2020-08-19 ENCOUNTER — Telehealth: Payer: Self-pay | Admitting: Family Medicine

## 2020-08-19 DIAGNOSIS — E079 Disorder of thyroid, unspecified: Secondary | ICD-10-CM

## 2020-08-19 NOTE — Telephone Encounter (Signed)
Please review last TSH reading

## 2020-08-19 NOTE — Telephone Encounter (Signed)
Last appt 07-29-20 with [facility]. No followup

## 2020-08-19 NOTE — Telephone Encounter (Signed)
Pt was taking as directed, please update new rx she needs refill

## 2020-08-19 NOTE — Telephone Encounter (Signed)
[name] from [employer] needing a call back from Dr Carlynn Purl regarding patient. Needing to know if Dr Carlynn Purl will attend for patient's hospice care and certify that patient is terminally ill.

## 2020-08-19 NOTE — Telephone Encounter (Signed)
Latasha calling from [facility] is calling to see if Dr. Carlynn Purl  will be the attending doctor for the pt. And certify that the pt is terminally ill. Please advise Cb- [phone] ok to vm

## 2020-08-19 NOTE — Telephone Encounter (Signed)
Requested medication (s) are due for refill today:  Yes  Requested medication (s) are on the active medication list:   Yes  Future visit scheduled:   No   Last ordered: 07/15/2020 #30, 0 refills  Returned because protocol failed due to lab work due.       Requested Prescriptions  Pending Prescriptions Disp Refills   EUTHYROX 75 MCG tablet [Pharmacy Med Name: Euthyrox 75 MCG Oral Tablet] 30 tablet 0    Sig: TAKE 1 TABLET BY MOUTH ONCE DAILY BEFORE BREAKFAST      Endocrinology:  Hypothyroid Agents Failed - 08/19/2020  7:20 AM      Failed - TSH needs to be rechecked within 3 months after an abnormal result. Refill until TSH is due.      Failed - TSH in normal range and within 360 days    TSH  Date Value Ref Range Status  07/29/2020 5.23 (H) 0.40 - 4.50 mIU/L Final          Passed - Valid encounter within last 12 months    Recent Outpatient Visits           3 weeks ago Pressure injury of skin of sacral region, unspecified injury stage   [facility] Alba Cory, MD   3 months ago Need for 23-polyvalent pneumococcal polysaccharide vaccine   [facility] [place] D[place] M, DO   5 months ago Mixed hyperlipidemia   [facility] Jamelle Haring, MD   6 months ago Encounter for examination following treatment at hospital   [facility] Jamelle Haring, MD   7 months ago Leg swelling   [facility] Jamelle Haring, MD

## 2020-08-19 NOTE — Telephone Encounter (Signed)
Not our pt looks to be a [facility]

## 2020-08-20 NOTE — Telephone Encounter (Signed)
VM left verbal given

## 2020-08-20 NOTE — Telephone Encounter (Signed)
Spoke with daughter to inform that prescription has been sent to pharmacy

## 2020-08-21 ENCOUNTER — Telehealth: Payer: Self-pay

## 2020-08-21 NOTE — Telephone Encounter (Signed)
Clarified directions with daughter

## 2020-08-21 NOTE — Telephone Encounter (Signed)
Copied from CRM [id number]. Topic: General - Other >> Aug 21, 2020 10:06 AM Marylen Ponto wrote: Reason for CRM: Pt daughter Kathie Rhodes stated she received a call from a nurse informing her that pt thyroid medication EUTHYROX 75 MCG tablet will be increased but when she picked it up from the pharmacy the Rx was the same. Kathie Rhodes requests call back at [phone]

## 2020-10-08 ENCOUNTER — Telehealth: Payer: Self-pay | Admitting: Family Medicine

## 2020-10-08 DIAGNOSIS — E079 Disorder of thyroid, unspecified: Secondary | ICD-10-CM

## 2020-10-08 NOTE — Telephone Encounter (Signed)
   Notes to clinic:  Review for refill Lab note mention possible change in dose    Requested Prescriptions  Pending Prescriptions Disp Refills   EUTHYROX 75 MCG tablet [Pharmacy Med Name: Euthyrox 75 MCG Oral Tablet] 90 tablet 0    Sig: TAKE 1 TABLET BY MOUTH ONCE DAILY BEFORE BREAKFAST      Endocrinology:  Hypothyroid Agents Failed - 10/08/2020 11:40 AM      Failed - TSH needs to be rechecked within 3 months after an abnormal result. Refill until TSH is due.      Failed - TSH in normal range and within 360 days    TSH  Date Value Ref Range Status  07/29/2020 5.23 (H) 0.40 - 4.50 mIU/L Final          Passed - Valid encounter within last 12 months    Recent Outpatient Visits           2 months ago Pressure injury of skin of sacral region, unspecified injury stage   [facility] Alba Cory, MD   5 months ago Need for 23-polyvalent pneumococcal polysaccharide vaccine   [facility] [place] Dense M, DO   7 months ago Mixed hyperlipidemia   [facility] Jamelle Haring, MD   8 months ago Encounter for examination following treatment at hospital   [facility] Jamelle Haring, MD   8 months ago Leg swelling   [facility] Jamelle Haring, MD

## 2020-10-15 ENCOUNTER — Telehealth: Payer: Self-pay

## 2020-10-15 DIAGNOSIS — E079 Disorder of thyroid, unspecified: Secondary | ICD-10-CM

## 2020-10-15 NOTE — Telephone Encounter (Signed)
Copied from CRM [id number]. Topic: General - Other >> Oct 15, 2020  1:03 PM Marylen Ponto wrote: Reason for CRM: Victorino Dike with [facility] stated pt family member advised that the Rx for EUTHYROX 75 MCG tablet was increased to 1and half tablets daily but they have not received a new Rx to reflect the increase in daily dose. Victorino Dike stated the family member advised that this information was given verbally. Victorino Dike requests that the new Rx with the increased dose be sent to pt pharmacy because as of now the pharmacy is not filling the Rx due to it being too soon. Cb# [phone]

## 2020-10-16 NOTE — Telephone Encounter (Signed)
Betty aware

## 2020-10-16 NOTE — Telephone Encounter (Signed)
Pts daugher Kathie Rhodes states that her mom is with hospice and she was wanting to know if hospice can get these labs done or does she need to come to the office. Please advise

## 2020-10-17 LAB — TSH: TSH: 2.48 (ref 0.41–5.90)

## 2020-10-17 NOTE — Telephone Encounter (Signed)
Hospice will draw TSH and they will get the labs to [place]

## 2020-10-30 ENCOUNTER — Telehealth: Payer: Self-pay | Admitting: Family Medicine

## 2020-10-30 NOTE — Telephone Encounter (Signed)
Lvm to come in for lab work in order to receive medication refill. Lab is open daily from 8-12 and 2-4

## 2020-10-30 NOTE — Telephone Encounter (Signed)
Please call pt she is due for tsh lab before refills on meds

## 2020-10-30 NOTE — Telephone Encounter (Signed)
FYI in stack of papers for review

## 2020-10-30 NOTE — Telephone Encounter (Signed)
Pts daughter called and stated that the pt just had blood work done about a week ago/ she is in hospice and they advised that they sent office results / the Hospice nurse name is Victorino Dike and her number is [phone]/ please advise   Pt was called by office to get an appt and blood work needed for med refill

## 2020-11-01 NOTE — Telephone Encounter (Signed)
Called left vm with jennifer to refax lab results

## 2020-11-04 ENCOUNTER — Other Ambulatory Visit: Payer: Self-pay | Admitting: Family Medicine

## 2020-11-06 NOTE — Telephone Encounter (Signed)
Please close chart

## 2020-11-18 ENCOUNTER — Other Ambulatory Visit: Payer: Self-pay | Admitting: Family Medicine

## 2020-11-22 ENCOUNTER — Telehealth: Payer: Self-pay | Admitting: Family Medicine

## 2020-11-22 ENCOUNTER — Other Ambulatory Visit: Payer: Self-pay | Admitting: Family Medicine

## 2020-11-22 DIAGNOSIS — L89159 Pressure ulcer of sacral region, unspecified stage: Secondary | ICD-10-CM

## 2020-11-22 DIAGNOSIS — Z515 Encounter for palliative care: Secondary | ICD-10-CM

## 2020-11-22 DIAGNOSIS — E079 Disorder of thyroid, unspecified: Secondary | ICD-10-CM

## 2020-11-22 MED ORDER — LEVOTHYROXINE SODIUM 75 MCG PO TABS: 75.0000 ug | ORAL_TABLET | Freq: Every day | ORAL | 1 refills | Status: AC

## 2020-11-22 MED ORDER — OXYCODONE HCL 5 MG PO TABS: 5.0000 mg | ORAL_TABLET | Freq: Three times a day (TID) | ORAL | 0 refills | Status: AC | PRN

## 2020-11-22 NOTE — Telephone Encounter (Signed)
Victorino Dike with [facility] calling. Pt is in end stage dementia, bed bound.  Pt has an stage 3 pressure wound on her sacrum.  Pt not staying off the wound.  Family giving tylenol only.  This wound will not heal.  She is not eating. She said pt is dying. Family would like a pain medicine   Would like to start with Oxycodone 5mg   [facility] [id number], [place] - [place] [address] jennifer [phone]

## 2020-12-14 DEATH — deceased

## 2022-02-15 IMAGING — CT CT HEAD W/O CM
3 series · 16 of 47 positions shown, 19 images · non-contrast
Comparison: December 23, 2011

CLINICAL DATA: Unwitnessed fall.

EXAM:
CT HEAD WITHOUT CONTRAST
TECHNIQUE: Contiguous axial images were obtained from the base of the skull
through the vertex without intravenous contrast.

[Series 2: head wo · axial · 0.40mm/px · z∈[+470,+595]mm · 10 of 30 slices shown, 13 images]
[im 3/30  brain]
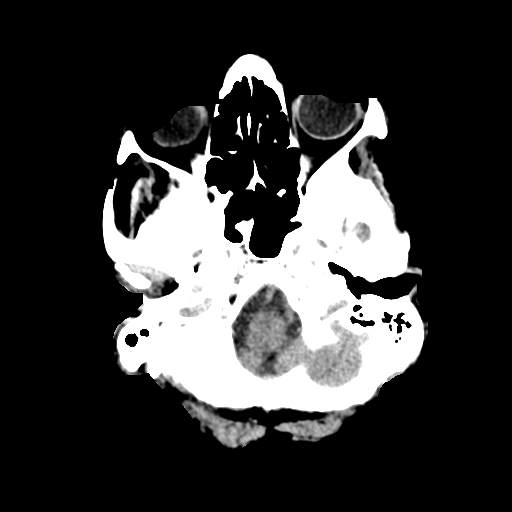
[im 3/30  bone]
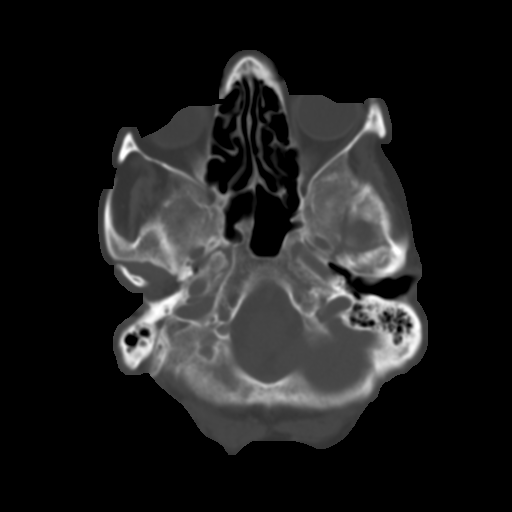
[im 6/30  brain]
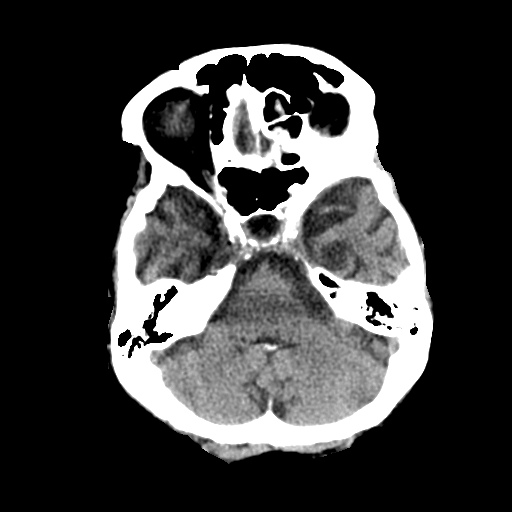
[im 9/30  brain]
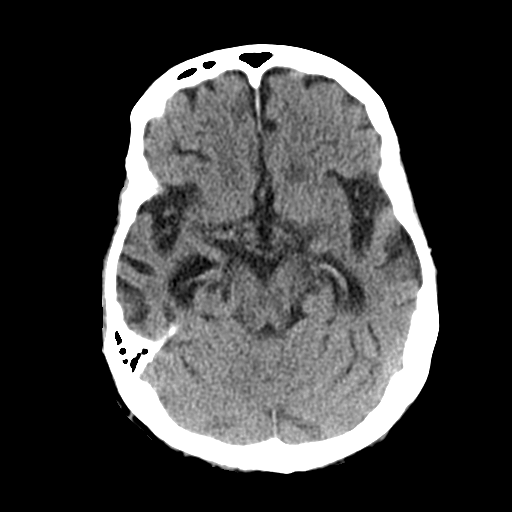
[im 11/30  brain]
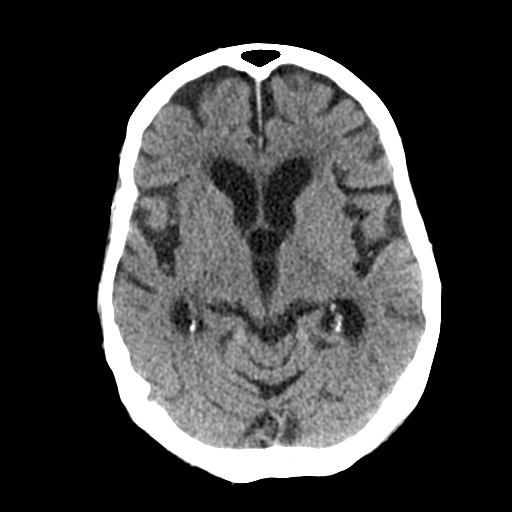
[im 14/30  brain]
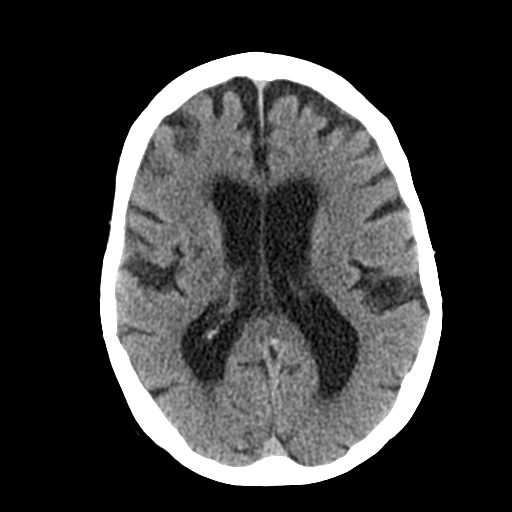
[im 14/30  bone]
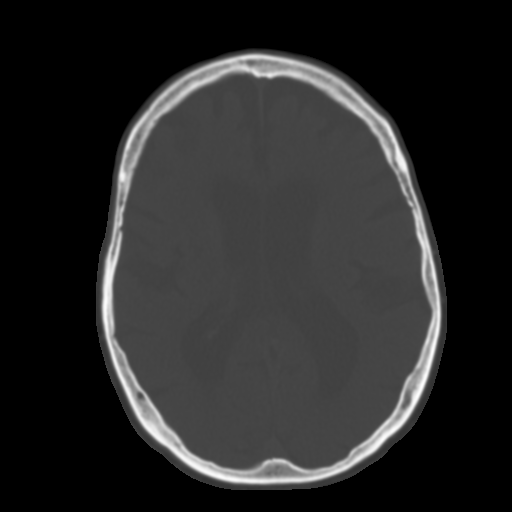
[im 17/30  brain]
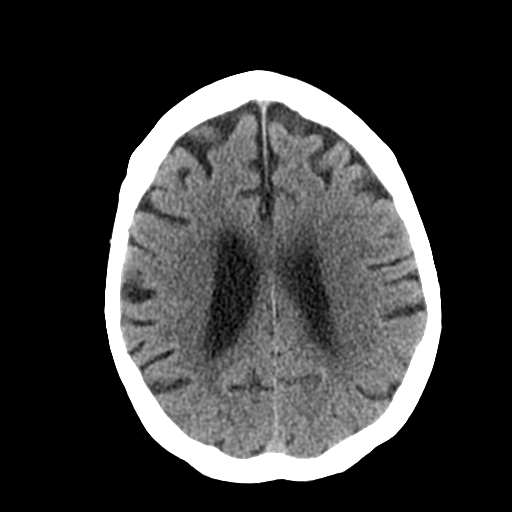
[im 20/30  brain]
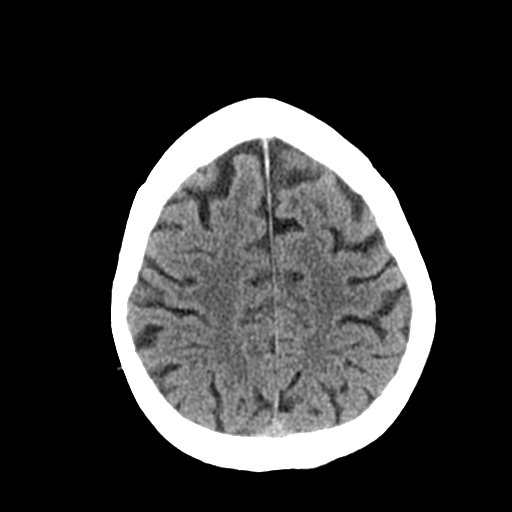
[im 23/30  brain]
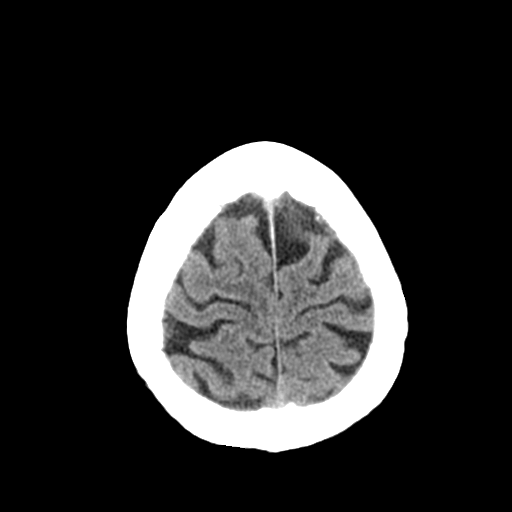
[im 25/30  brain]
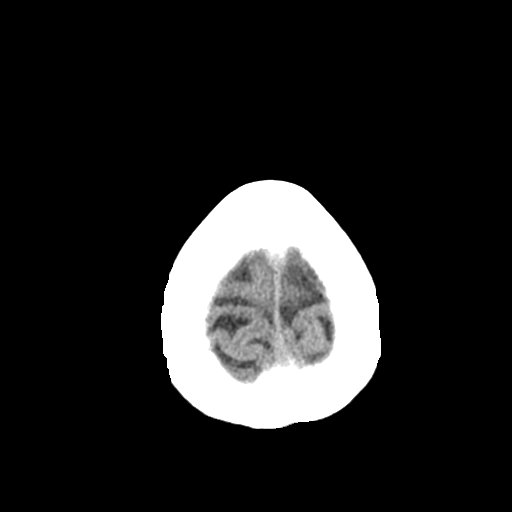
[im 25/30  bone]
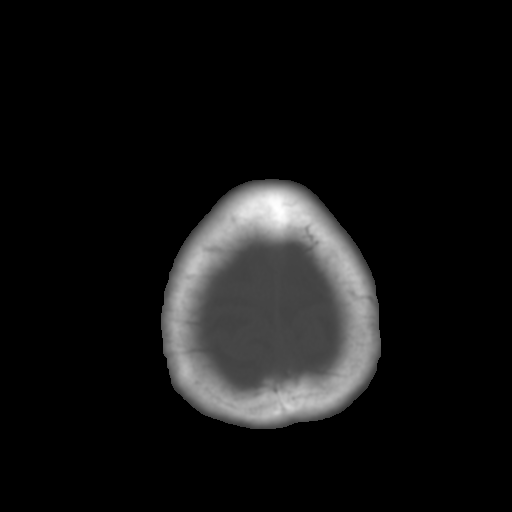
[im 28/30  brain]
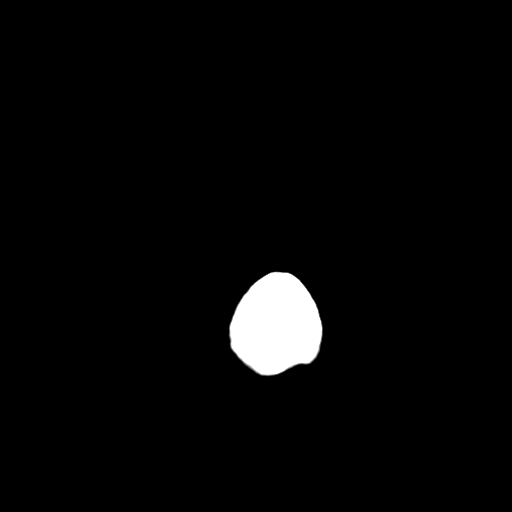

[Series 4: coronal soft tissue · coronal · 0.29mm/px · 3 of 61 slices shown]
[im 21/61  brain]
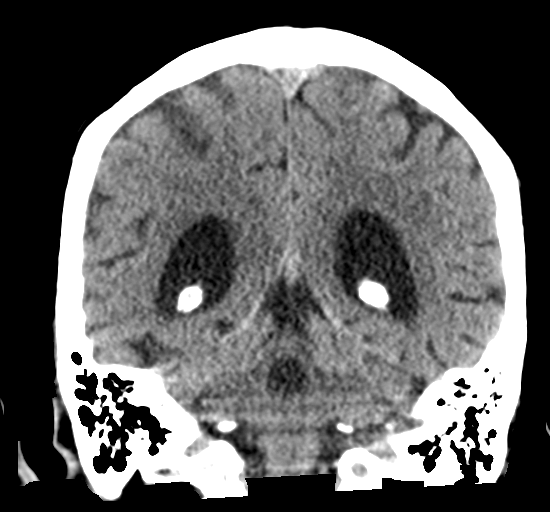
[im 27/61  brain]
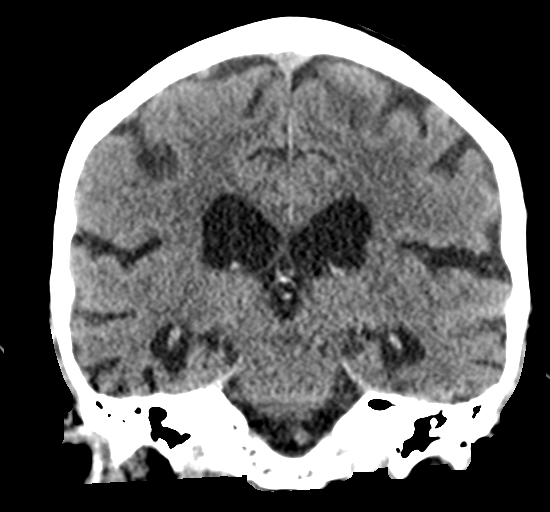
[im 34/61  brain]
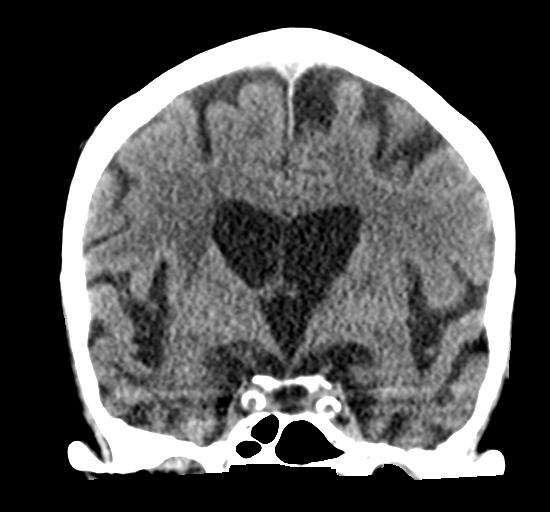

[Series 5: sagittal soft tissue · sagittal · 0.29mm/px · 3 of 50 slices shown]
[im 17/50  brain]
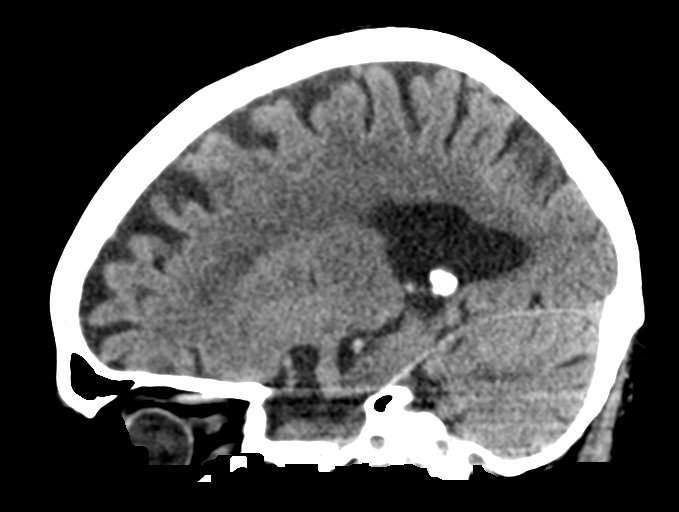
[im 25/50  brain]
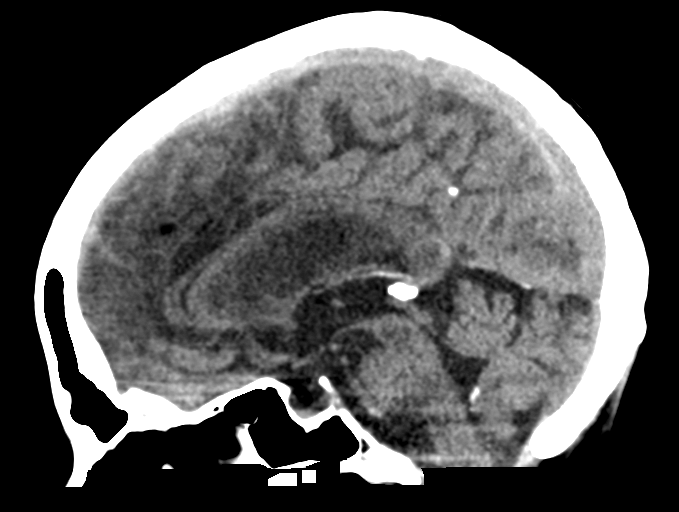
[im 33/50  brain]
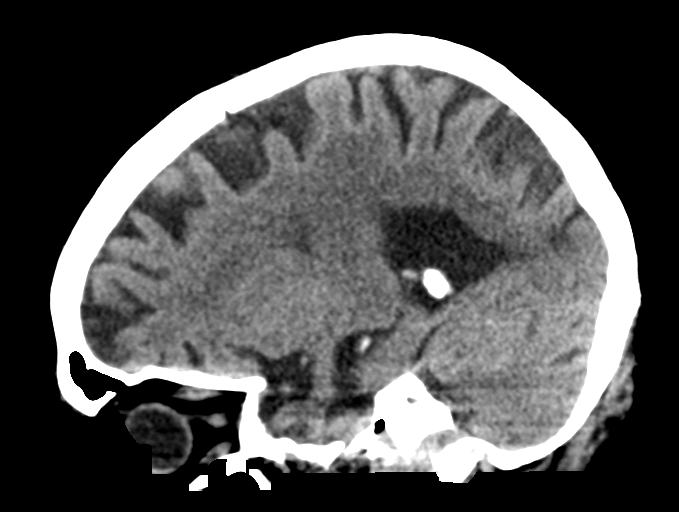

[16 of 47 positions shown; findings below may reference images not displayed]

FINDINGS: Brain: There is mild cerebral atrophy with widening of the
extra-axial spaces and ventricular dilatation.
There are areas of decreased attenuation within the white matter
tracts of the supratentorial brain, consistent with microvascular
disease changes.

Vascular: No hyperdense vessel or unexpected calcification.

Skull: Normal. Negative for fracture or focal lesion.

Sinuses/Orbits: No acute finding.

Other: None.
IMPRESSION: 1. Generalized cerebral atrophy.
2. No acute intracranial abnormality.

## 2022-02-15 IMAGING — CR DG CHEST 1V
1 series · 1 of 1 positions shown · non-contrast
Comparison: None.

CLINICAL DATA: Fall

EXAM:
CHEST  1 VIEW

[chest ap]
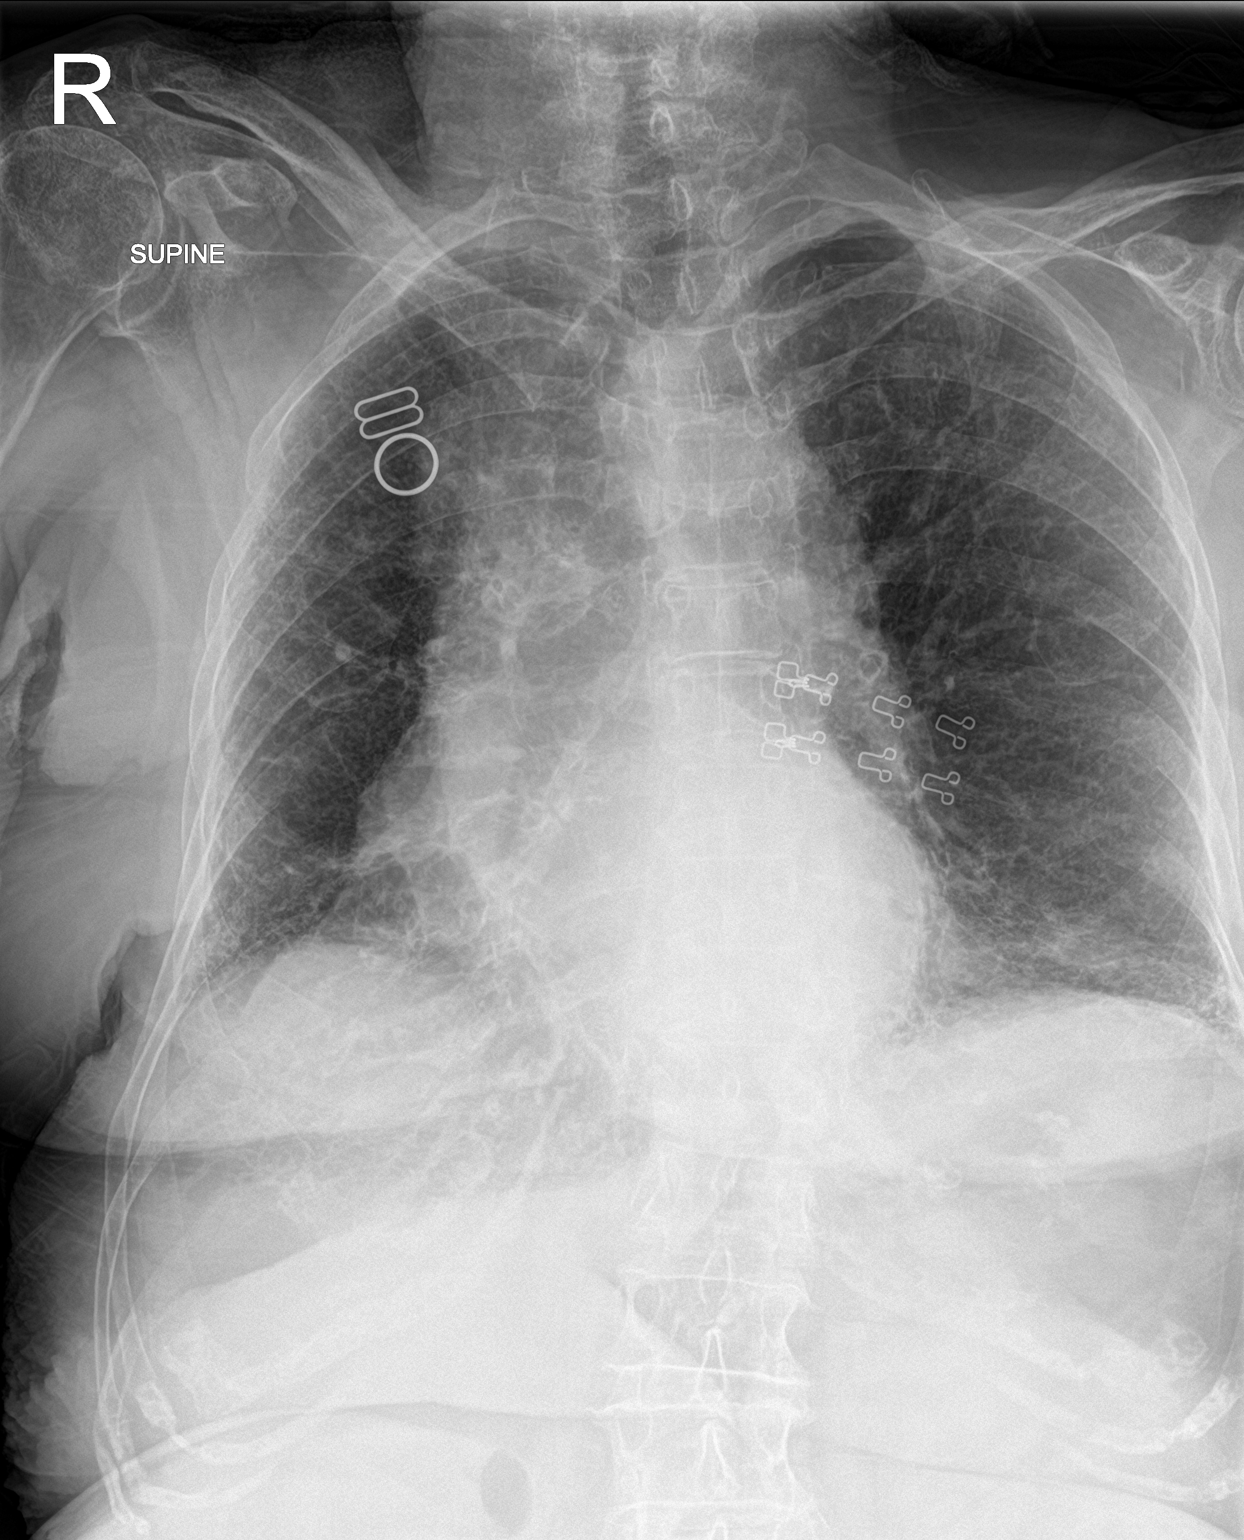

[1 of 1 positions shown; findings below may reference images not displayed]

FINDINGS: Diffuse bilateral reticular opacity greatest at the bases and
periphery suggesting pulmonary fibrosis. No acute consolidation,
pleural effusion or pneumothorax. Cardiac size within normal limits.
Retrocardiac opacity, question hiatal hernia. Mild convex right
paratracheal opacity. No pneumothorax. Possible left mid clavicle
deformity but limited evaluation secondary to osseous overlap.
IMPRESSION: 1. Findings suspicious for pulmonary fibrosis. No acute infiltrate,
edema or pneumothorax.
2. Possible fracture deformity of the mid left clavicle, age
indeterminate. Consider dedicated left clavicle views if painful
here.
3. Possible hiatal hernia.
4. Slight convex opacity in the right paratracheal region, likely
augmented by rotation and position, recommend dedicated two view
chest to assess for persistence.
